# Patient Record
Sex: Female | Born: 1940 | Race: White | Hispanic: No | Smoking: Current every day smoker
Health system: Southern US, Community
[De-identification: ages and names within clinical notes are randomized; demographics above are authoritative.]

## PROBLEM LIST (undated history)

## (undated) DIAGNOSIS — G629 Polyneuropathy, unspecified: Secondary | ICD-10-CM

## (undated) DIAGNOSIS — M5090 Cervical disc disorder, unspecified, unspecified cervical region: Secondary | ICD-10-CM

## (undated) DIAGNOSIS — G2581 Restless legs syndrome: Secondary | ICD-10-CM

## (undated) DIAGNOSIS — M519 Unspecified thoracic, thoracolumbar and lumbosacral intervertebral disc disorder: Secondary | ICD-10-CM

## (undated) DIAGNOSIS — K219 Gastro-esophageal reflux disease without esophagitis: Secondary | ICD-10-CM

## (undated) DIAGNOSIS — G4733 Obstructive sleep apnea (adult) (pediatric): Secondary | ICD-10-CM

## (undated) DIAGNOSIS — F1011 Alcohol abuse, in remission: Secondary | ICD-10-CM

## (undated) DIAGNOSIS — F329 Major depressive disorder, single episode, unspecified: Secondary | ICD-10-CM

## (undated) DIAGNOSIS — E119 Type 2 diabetes mellitus without complications: Secondary | ICD-10-CM

## (undated) DIAGNOSIS — I480 Paroxysmal atrial fibrillation: Secondary | ICD-10-CM

## (undated) DIAGNOSIS — J449 Chronic obstructive pulmonary disease, unspecified: Secondary | ICD-10-CM

## (undated) DIAGNOSIS — E039 Hypothyroidism, unspecified: Secondary | ICD-10-CM

## (undated) DIAGNOSIS — I5032 Chronic diastolic (congestive) heart failure: Secondary | ICD-10-CM

## (undated) DIAGNOSIS — F32A Depression, unspecified: Secondary | ICD-10-CM

## (undated) DIAGNOSIS — E785 Hyperlipidemia, unspecified: Secondary | ICD-10-CM

## (undated) DIAGNOSIS — J9600 Acute respiratory failure, unspecified whether with hypoxia or hypercapnia: Secondary | ICD-10-CM

## (undated) DIAGNOSIS — F29 Unspecified psychosis not due to a substance or known physiological condition: Secondary | ICD-10-CM

## (undated) DIAGNOSIS — D649 Anemia, unspecified: Secondary | ICD-10-CM

## (undated) DIAGNOSIS — I119 Hypertensive heart disease without heart failure: Secondary | ICD-10-CM

## (undated) DIAGNOSIS — I2510 Atherosclerotic heart disease of native coronary artery without angina pectoris: Secondary | ICD-10-CM

## (undated) DIAGNOSIS — I4892 Unspecified atrial flutter: Secondary | ICD-10-CM

## (undated) HISTORY — DX: Unspecified thoracic, thoracolumbar and lumbosacral intervertebral disc disorder: M51.9

## (undated) HISTORY — DX: Paroxysmal atrial fibrillation: I48.0

## (undated) HISTORY — PX: REPLACEMENT TOTAL KNEE: SUR1224

## (undated) HISTORY — DX: Acute respiratory failure, unspecified whether with hypoxia or hypercapnia: J96.00

## (undated) HISTORY — PX: TONSILLECTOMY: SUR1361

## (undated) HISTORY — DX: Hyperlipidemia, unspecified: E78.5

## (undated) HISTORY — PX: THYROIDECTOMY: SHX17

## (undated) HISTORY — DX: Cervical disc disorder, unspecified, unspecified cervical region: M50.90

## (undated) HISTORY — DX: Hypertensive heart disease without heart failure: I11.9

## (undated) HISTORY — DX: Anemia, unspecified: D64.9

## (undated) HISTORY — DX: Unspecified psychosis not due to a substance or known physiological condition: F29

## (undated) HISTORY — DX: Hypothyroidism, unspecified: E03.9

## (undated) HISTORY — PX: CARDIOVERSION: SHX1299

## (undated) HISTORY — DX: Restless legs syndrome: G25.81

## (undated) HISTORY — DX: Depression, unspecified: F32.A

## (undated) HISTORY — DX: Polyneuropathy, unspecified: G62.9

## (undated) HISTORY — DX: Atherosclerotic heart disease of native coronary artery without angina pectoris: I25.10

## (undated) HISTORY — DX: Unspecified atrial flutter: I48.92

## (undated) HISTORY — DX: Chronic obstructive pulmonary disease, unspecified: J44.9

## (undated) HISTORY — DX: Obstructive sleep apnea (adult) (pediatric): G47.33

## (undated) HISTORY — DX: Major depressive disorder, single episode, unspecified: F32.9

## (undated) HISTORY — DX: Chronic diastolic (congestive) heart failure: I50.32

## (undated) HISTORY — DX: Type 2 diabetes mellitus without complications: E11.9

## (undated) HISTORY — DX: Alcohol abuse, in remission: F10.11

## (undated) HISTORY — DX: Gastro-esophageal reflux disease without esophagitis: K21.9

## (undated) HISTORY — PX: COLONOSCOPY: SHX174

---

## 2003-03-28 ENCOUNTER — Other Ambulatory Visit: Admission: RE | Admit: 2003-03-28 | Discharge: 2003-03-28 | Payer: Self-pay | Admitting: Family Medicine

## 2003-09-25 ENCOUNTER — Other Ambulatory Visit: Payer: Self-pay

## 2004-02-05 ENCOUNTER — Other Ambulatory Visit: Payer: Self-pay

## 2004-08-07 ENCOUNTER — Ambulatory Visit: Payer: Self-pay

## 2005-05-12 ENCOUNTER — Ambulatory Visit: Payer: Self-pay | Admitting: Internal Medicine

## 2005-05-17 ENCOUNTER — Ambulatory Visit: Payer: Self-pay | Admitting: Internal Medicine

## 2005-09-05 ENCOUNTER — Ambulatory Visit: Payer: Self-pay | Admitting: Unknown Physician Specialty

## 2005-10-01 ENCOUNTER — Other Ambulatory Visit: Payer: Self-pay

## 2005-10-08 ENCOUNTER — Inpatient Hospital Stay: Payer: Self-pay | Admitting: Unknown Physician Specialty

## 2005-11-28 ENCOUNTER — Ambulatory Visit: Payer: Self-pay | Admitting: Urology

## 2006-01-02 ENCOUNTER — Ambulatory Visit: Payer: Self-pay | Admitting: Urology

## 2006-01-05 ENCOUNTER — Ambulatory Visit: Payer: Self-pay | Admitting: Urology

## 2006-01-13 ENCOUNTER — Other Ambulatory Visit: Payer: Self-pay

## 2006-01-19 ENCOUNTER — Inpatient Hospital Stay: Payer: Self-pay | Admitting: Surgery

## 2006-02-11 ENCOUNTER — Other Ambulatory Visit: Payer: Self-pay

## 2006-02-11 ENCOUNTER — Inpatient Hospital Stay: Payer: Self-pay | Admitting: Internal Medicine

## 2006-02-25 ENCOUNTER — Ambulatory Visit: Payer: Self-pay | Admitting: Surgery

## 2006-03-08 ENCOUNTER — Other Ambulatory Visit: Payer: Self-pay

## 2006-03-08 ENCOUNTER — Inpatient Hospital Stay: Payer: Self-pay | Admitting: Internal Medicine

## 2006-03-18 ENCOUNTER — Other Ambulatory Visit: Payer: Self-pay

## 2006-03-25 ENCOUNTER — Ambulatory Visit: Payer: Self-pay | Admitting: Pain Medicine

## 2006-04-22 ENCOUNTER — Ambulatory Visit: Payer: Self-pay | Admitting: Urology

## 2006-04-29 ENCOUNTER — Inpatient Hospital Stay: Payer: Self-pay | Admitting: Surgery

## 2006-06-23 ENCOUNTER — Inpatient Hospital Stay: Payer: Self-pay | Admitting: Surgery

## 2007-05-29 ENCOUNTER — Emergency Department: Payer: Self-pay | Admitting: Unknown Physician Specialty

## 2007-05-29 ENCOUNTER — Other Ambulatory Visit: Payer: Self-pay

## 2007-06-02 ENCOUNTER — Inpatient Hospital Stay: Payer: Self-pay | Admitting: Internal Medicine

## 2007-06-02 ENCOUNTER — Other Ambulatory Visit: Payer: Self-pay

## 2007-06-04 ENCOUNTER — Inpatient Hospital Stay: Payer: Self-pay | Admitting: Psychiatry

## 2007-06-08 ENCOUNTER — Ambulatory Visit: Payer: Self-pay | Admitting: Unknown Physician Specialty

## 2007-08-17 ENCOUNTER — Emergency Department: Payer: Self-pay | Admitting: Emergency Medicine

## 2007-09-09 ENCOUNTER — Inpatient Hospital Stay: Payer: Self-pay | Admitting: Internal Medicine

## 2007-09-09 ENCOUNTER — Other Ambulatory Visit: Payer: Self-pay

## 2007-10-25 ENCOUNTER — Ambulatory Visit: Payer: Self-pay | Admitting: Internal Medicine

## 2007-11-17 ENCOUNTER — Ambulatory Visit: Payer: Self-pay | Admitting: Vascular Surgery

## 2008-01-10 ENCOUNTER — Inpatient Hospital Stay: Payer: Self-pay | Admitting: Internal Medicine

## 2008-01-10 ENCOUNTER — Other Ambulatory Visit: Payer: Self-pay

## 2008-01-16 ENCOUNTER — Inpatient Hospital Stay: Payer: Self-pay | Admitting: Psychiatry

## 2008-02-01 ENCOUNTER — Ambulatory Visit: Payer: Self-pay | Admitting: Unknown Physician Specialty

## 2008-02-04 ENCOUNTER — Ambulatory Visit: Payer: Self-pay | Admitting: Unknown Physician Specialty

## 2008-08-23 ENCOUNTER — Encounter: Admission: RE | Admit: 2008-08-23 | Discharge: 2008-08-23 | Payer: Self-pay | Admitting: Orthopaedic Surgery

## 2008-09-28 ENCOUNTER — Inpatient Hospital Stay: Payer: Self-pay | Admitting: Psychiatry

## 2008-12-20 ENCOUNTER — Emergency Department: Payer: Self-pay

## 2009-01-31 ENCOUNTER — Inpatient Hospital Stay: Payer: Self-pay | Admitting: Psychiatry

## 2009-03-10 ENCOUNTER — Inpatient Hospital Stay: Payer: Self-pay | Admitting: Psychiatry

## 2009-05-09 ENCOUNTER — Inpatient Hospital Stay: Payer: Self-pay | Admitting: Psychiatry

## 2009-08-29 ENCOUNTER — Inpatient Hospital Stay: Payer: Self-pay | Admitting: Psychiatry

## 2009-11-09 ENCOUNTER — Inpatient Hospital Stay: Payer: Self-pay | Admitting: Internal Medicine

## 2010-01-13 ENCOUNTER — Emergency Department: Payer: Self-pay

## 2010-01-17 ENCOUNTER — Inpatient Hospital Stay: Payer: Self-pay | Admitting: Psychiatry

## 2010-05-09 ENCOUNTER — Emergency Department: Payer: Self-pay | Admitting: Emergency Medicine

## 2011-03-07 HISTORY — PX: CERVICAL DISC SURGERY: SHX588

## 2011-05-07 HISTORY — PX: CARDIAC CATHETERIZATION: SHX172

## 2011-05-07 HISTORY — PX: CORONARY ANGIOPLASTY: SHX604

## 2011-05-20 ENCOUNTER — Inpatient Hospital Stay: Payer: Medicare Other | Admitting: Internal Medicine

## 2011-05-20 DIAGNOSIS — R7989 Other specified abnormal findings of blood chemistry: Secondary | ICD-10-CM

## 2011-05-21 ENCOUNTER — Encounter: Payer: Self-pay | Admitting: Cardiovascular Disease

## 2011-05-21 ENCOUNTER — Inpatient Hospital Stay (HOSPITAL_COMMUNITY)
Admission: AD | Admit: 2011-05-21 | Discharge: 2011-05-23 | DRG: 247 | Disposition: A | Discharge status: Home / Self Care | Payer: Medicare Other | Source: Other Acute Inpatient Hospital | Attending: Cardiovascular Disease | Admitting: Cardiovascular Disease

## 2011-05-21 DIAGNOSIS — E039 Hypothyroidism, unspecified: Secondary | ICD-10-CM | POA: Diagnosis present

## 2011-05-21 DIAGNOSIS — M51379 Other intervertebral disc degeneration, lumbosacral region without mention of lumbar back pain or lower extremity pain: Secondary | ICD-10-CM | POA: Diagnosis present

## 2011-05-21 DIAGNOSIS — M5137 Other intervertebral disc degeneration, lumbosacral region: Secondary | ICD-10-CM | POA: Diagnosis present

## 2011-05-21 DIAGNOSIS — I214 Non-ST elevation (NSTEMI) myocardial infarction: Principal | ICD-10-CM | POA: Diagnosis present

## 2011-05-21 DIAGNOSIS — I2510 Atherosclerotic heart disease of native coronary artery without angina pectoris: Secondary | ICD-10-CM

## 2011-05-21 DIAGNOSIS — E785 Hyperlipidemia, unspecified: Secondary | ICD-10-CM | POA: Diagnosis present

## 2011-05-21 DIAGNOSIS — I509 Heart failure, unspecified: Secondary | ICD-10-CM

## 2011-05-21 DIAGNOSIS — I10 Essential (primary) hypertension: Secondary | ICD-10-CM | POA: Diagnosis present

## 2011-05-21 DIAGNOSIS — E669 Obesity, unspecified: Secondary | ICD-10-CM | POA: Diagnosis present

## 2011-05-21 DIAGNOSIS — G8929 Other chronic pain: Secondary | ICD-10-CM | POA: Diagnosis present

## 2011-05-21 DIAGNOSIS — Z96659 Presence of unspecified artificial knee joint: Secondary | ICD-10-CM

## 2011-05-21 DIAGNOSIS — Z87891 Personal history of nicotine dependence: Secondary | ICD-10-CM

## 2011-05-21 LAB — GLUCOSE, CAPILLARY
Glucose-Capillary: 122 mg/dL — ABNORMAL HIGH (ref 70–99)
Glucose-Capillary: 131 mg/dL — ABNORMAL HIGH (ref 70–99)

## 2011-05-22 DIAGNOSIS — I2510 Atherosclerotic heart disease of native coronary artery without angina pectoris: Secondary | ICD-10-CM

## 2011-05-22 LAB — GLUCOSE, CAPILLARY
Glucose-Capillary: 122 mg/dL — ABNORMAL HIGH (ref 70–99)
Glucose-Capillary: 126 mg/dL — ABNORMAL HIGH (ref 70–99)

## 2011-05-22 LAB — BASIC METABOLIC PANEL
BUN: 15 mg/dL (ref 6–23)
Calcium: 9.5 mg/dL (ref 8.4–10.5)
Creatinine, Ser: 0.56 mg/dL (ref 0.50–1.10)
GFR calc Af Amer: >60 mL/min (ref >60)
GFR calc non Af Amer: >60 mL/min (ref >60)
Potassium: 3.3 mEq/L — ABNORMAL LOW (ref 3.5–5.1)

## 2011-05-22 LAB — CBC
HCT: 38.7 % (ref 36.0–46.0)
MCHC: 32.8 g/dL (ref 30.0–36.0)
MCV: 87.6 fL (ref 78.0–100.0)
RDW: 14.2 % (ref 11.5–15.5)

## 2011-05-22 LAB — POCT ACTIVATED CLOTTING TIME: Activated Clotting Time: 380 seconds

## 2011-05-22 LAB — PROTIME-INR: INR: 0.99 (ref 0.00–1.49)

## 2011-05-23 DIAGNOSIS — I214 Non-ST elevation (NSTEMI) myocardial infarction: Secondary | ICD-10-CM

## 2011-05-23 LAB — URINALYSIS, ROUTINE W REFLEX MICROSCOPIC
Bilirubin Urine: NEGATIVE
Ketones, ur: NEGATIVE mg/dL
Protein, ur: 30 mg/dL — AB
Urobilinogen, UA: 0.2 mg/dL (ref 0.0–1.0)
pH: 5.5 (ref 5.0–8.0)

## 2011-05-23 LAB — CBC
MCHC: 31.6 g/dL (ref 30.0–36.0)
RDW: 14.3 % (ref 11.5–15.5)

## 2011-05-23 LAB — URINE MICROSCOPIC-ADD ON

## 2011-05-23 LAB — BASIC METABOLIC PANEL
Calcium: 9.5 mg/dL (ref 8.4–10.5)
Creatinine, Ser: 0.98 mg/dL (ref 0.50–1.10)
GFR calc non Af Amer: 56 mL/min — ABNORMAL LOW (ref >60)
Sodium: 136 mEq/L (ref 135–145)

## 2011-05-23 LAB — GLUCOSE, CAPILLARY: Glucose-Capillary: 117 mg/dL — ABNORMAL HIGH (ref 70–99)

## 2011-05-24 ENCOUNTER — Emergency Department: Payer: Medicare Other | Admitting: *Deleted

## 2011-05-25 ENCOUNTER — Inpatient Hospital Stay: Payer: Medicare Other | Admitting: Internal Medicine

## 2011-05-25 DIAGNOSIS — R0602 Shortness of breath: Secondary | ICD-10-CM

## 2011-05-25 DIAGNOSIS — R011 Cardiac murmur, unspecified: Secondary | ICD-10-CM

## 2011-05-25 DIAGNOSIS — R509 Fever, unspecified: Secondary | ICD-10-CM

## 2011-05-26 DIAGNOSIS — R4182 Altered mental status, unspecified: Secondary | ICD-10-CM

## 2011-05-27 DIAGNOSIS — I4892 Unspecified atrial flutter: Secondary | ICD-10-CM

## 2011-05-27 DIAGNOSIS — I2510 Atherosclerotic heart disease of native coronary artery without angina pectoris: Secondary | ICD-10-CM

## 2011-06-11 ENCOUNTER — Encounter: Payer: Medicare Other | Admitting: Cardiovascular Disease

## 2011-06-11 NOTE — Discharge Summary (Signed)
[name], [name]                ACCOUNT NO.:  [id number]  MEDICAL RECORD NO.:  [id number]  LOCATION:  6529                         FACILITY:  [facility]  PHYSICIAN:  Veverly Fells. Excell Seltzer, MD  DATE OF BIRTH:  Jul 20, 1941  DATE OF ADMISSION:  05/21/2011 DATE OF DISCHARGE:  05/23/2011                              DISCHARGE SUMMARY   PROCEDURES: 1. Cardiac catheterization. 2. PTCA and Promus drug-eluting stent 4.0 x 30 mm to the circumflex.  PRIMARY FINAL DISCHARGE DIAGNOSIS:  Non-ST segment elevation myocardial infarction.  SECONDARY DIAGNOSES: 1. Unresponsive on admission at [facility], responded to Narcan and     medication changes made. 2. Hypertension. 3. Lumbar disk disease. 4. Cervical disk disease with recent surgery in [facility]. 5. Hypothyroidism. 6. History of EtOH dependence with withdrawal seizures. 7. History of depression with suicidal ideation and alcohol     intoxication, requiring admission to Psych facility. 8. Chronic pain issues with narcotic abuse. 9. Dyslipidemia with a total cholesterol of 91, triglycerides 148, HDL     28, LDL 33 at [facility] this admission. 10.Obesity. 11.Allergy or intolerance to AMIODARONE. 12.Status post thyroidectomy, tonsillectomy, rectal fistula, with     colostomy and its reversal as well as a right knee replacement. 13.Remote history of tobacco use.  TIME OF DISCHARGE:  48 minutes.  HOSPITAL COURSE:  [name] is a 70 year old female with no previous history of coronary artery disease.  She was taken to [facility] because of being unresponsive.  She responded to Narcan.  Her EKG was abnormal and her cardiac enzymes were elevated, so she was seen by Cardiology.  Dr. Mariah Milling saw her and followed her while she was at [facility].  She was cathed there and that catheterization showed a totally occluded RCA that was chronic.  The mid circumflex had a 95% stenosis just before OM-1 and this was felt to be the culprit lesion. The LAD  was grossly patent.  Her EF was 55%.  Percutaneous intervention was recommended and she was transferred to [facility] for the procedure.  On the May 22, 2011, she had PTCA and drug-eluting stent described above to the circumflex, reducing the stenosis to zero.  The LAD was rechecked and it showed calcified 50% lesion in the mid vessel.  Dr. Excell Seltzer recommended aspirin and Plavix for 12 months.  On May 23, 2011, [name] was without chest pain, shortness of breath.  She had been on diuretics prior to admission as well as an ACE inhibitor.  Her BUN and creatinine had been somewhat elevated on admission at [facility], but she responded to hydration.  On postprocedure, her BUN is slightly elevated at 21 with a creatinine within normal limits at 0.98.  Additionally, there was concern for over sedation as she was on multiple medications with sedation potential. All of these drugs have been held and her level of consciousness was normal.  She is receiving p.r.n. pain medication.  A urinalysis and urine culture were performed at [facility] which showed greater than 100,000 colonies of E-coli.  Sensitivities were obtained and her antibiotics will be adjusted for this.  On May 23, 2011, [name] was much improved.  She is pending evaluation  by Dr. Excell Seltzer, but tentatively considered stable for discharge back to [facility] with close outpatient follow-up arranged.  DISCHARGE INSTRUCTIONS:  Her activity level is to be increase gradually. She is cleared to restart with physical therapy in 1 week.  She is to stick to a 2 grams sodium ADA carbohydrate modified diet.  Her cath site is to be treated with triple antibiotic ointment and a band-aid daily after bathing, and [facility] is to be contacted p.r.n. for signs of infection such as redness, swelling, or drainage.  She is to follow up with Dr. Mariah Milling at [facility] in [place] on September 5 at 2:30.  She is to follow up with Dr.  Juel Burrow as needed or as scheduled.  DISCHARGE MEDICATIONS:  Will be dictated separately.     Theodore Demark, PA-C   ______________________________ Veverly Fells. Excell Seltzer, MD    RB/MEDQ  D:  05/23/2011  T:  05/23/2011  Job:  [id number]  Electronically Signed by Theodore Demark PA-C on 05/28/2011 07:07:33 AM Electronically Signed by Tonny Bollman MD on 06/11/2011 11:34:03 PM

## 2011-06-11 NOTE — Cardiovascular Report (Signed)
NAMEMELANA, [name]                ACCOUNT NO.:  [id number]  MEDICAL RECORD NO.:  [id number]  LOCATION:  6529                         FACILITY:  [facility]  PHYSICIAN:  Veverly Fells. Excell Seltzer, MD  DATE OF BIRTH:  06/26/1941  DATE OF PROCEDURE:  05/22/2011 DATE OF DISCHARGE:  05/23/2011                           CARDIAC CATHETERIZATION   PROCEDURE:  Percutaneous transluminal coronary angioplasty and stenting of left circumflex.  PROCEDURAL INDICATIONS:  [name] is a 70 year old woman who presented to [facility] with altered mental status.  She has multiple medical problems with chronic pain, diabetes, hypertension.  She ruled in for non-ST-elevation infarction with significant elevation of both her troponin and CK-MB.  She underwent cardiac catheterization at [facility] demonstrating high-grade 90% stenosis of the left circumflex with associated hypodensity.  There is a tandem 75% lesion into the OM-1 branch.  Her LAD was difficult to visualize, but it did not appear to have any severe stenoses and the right coronary artery was chronically occluded and was a small vessel.  She was referred for PCI.  She was transferred to [facility], because of her severe chronic pain and difficulty tolerating the cath procedure.  We planned on attempting a radial PCI.  Risks and indications of procedure were reviewed with the patient and her daughter in detail prior to taking her down to the catheterization lab.  Informed consent was obtained.  Both hands were evaluated with an Freida Busman test and this was negative on both the right and left.  Therefore, the right groin was prepped, draped and anesthetized with 1% lidocaine. An access was obtained into the right femoral artery using the modified Seldinger technique and a 6-French sheath was placed.  Angiomax was started for anticoagulation.  The patient had been preloaded with Plavix.  An XBLAD 3.5-cm guide catheter was utilized.   There was marked tortuosity in the iliac system and a long sheath had to be placed.  The guide selectively engaged the circumflex.  I did several injections in trying to engage the LAD and was able to adequately visualize the LAD throughout its distribution.  There was a 50% mid-LAD stenosis, but there did not appear to be any hemodynamically significant stenoses throughout the LAD.  The circumflex showed high-grade stenosis as described with a 90% hypodense lesion and probable calcification in the proximal circumflex followed by 75% stenosis in the mid circumflex into the OM-1.  The patient again had difficulty tolerating the procedure and had great deal of pain in her back.  Her blood pressure was very high. She was treated with intravenous Dilaudid and Versed with multiple rounds of medication and also was given IV labetalol for blood pressure control.  These measures improved her blood pressure and pain level so that we could continue the procedure.  Once a therapeutic ACT was achieved, a cougar wire was advanced into the distal OM branch and the vessel was predilated with a 3.0 x 20-mm balloon.  With tandem lesions in a large vessel, I elected to cover this with a single stent.  A 4.0 x 38-mm Promus Element stent platform was chosen and the stent was deployed at 14 atmospheres.  It appeared  well expanded.  The stent was postdilated also with a 4-mm balloon which was taken to high pressure over the proximal segment in an area where the stent appeared a little under-expanded.  There was an excellent angiographic result with 0% residual stenosis at the completion of the procedure.  There was TIMI 3 flow both pre and post PCI.  The patient tolerated the procedure well. There were no immediate complications.  FINAL CONCLUSIONS:  Successful percutaneous intervention of tandem lesions in the left circumflex utilizing a 4.0 x 38-mm Promus drug- eluting stent.  RECOMMENDATIONS:  The  patient should continue on dual antiplatelet therapy with aspirin and Plavix for minimum of 12 months.     Veverly Fells. Excell Seltzer, MD     MDC/MEDQ  D:  05/24/2011  T:  05/24/2011  Job:  [id number]  Electronically Signed by Tonny Bollman MD on 06/11/2011 11:34:06 PM

## 2011-06-14 ENCOUNTER — Encounter: Payer: Self-pay | Admitting: Cardiovascular Disease

## 2011-06-14 ENCOUNTER — Inpatient Hospital Stay: Payer: Medicare Other | Admitting: Internal Medicine

## 2011-06-15 ENCOUNTER — Encounter: Payer: Self-pay | Admitting: Cardiovascular Disease

## 2011-06-15 DIAGNOSIS — R4182 Altered mental status, unspecified: Secondary | ICD-10-CM

## 2011-06-19 ENCOUNTER — Encounter: Payer: Self-pay | Admitting: Cardiovascular Disease

## 2011-06-19 ENCOUNTER — Ambulatory Visit (INDEPENDENT_AMBULATORY_CARE_PROVIDER_SITE_OTHER): Payer: Medicare Other | Admitting: Cardiovascular Disease

## 2011-06-19 DIAGNOSIS — G2581 Restless legs syndrome: Secondary | ICD-10-CM | POA: Insufficient documentation

## 2011-06-19 DIAGNOSIS — I4892 Unspecified atrial flutter: Secondary | ICD-10-CM | POA: Insufficient documentation

## 2011-06-19 DIAGNOSIS — E785 Hyperlipidemia, unspecified: Secondary | ICD-10-CM

## 2011-06-19 DIAGNOSIS — R4182 Altered mental status, unspecified: Secondary | ICD-10-CM

## 2011-06-19 DIAGNOSIS — I2510 Atherosclerotic heart disease of native coronary artery without angina pectoris: Secondary | ICD-10-CM | POA: Insufficient documentation

## 2011-06-19 NOTE — Assessment & Plan Note (Signed)
Currently with no symptoms of angina. No further workup at this time. Continue current medication regimen. 

## 2011-06-19 NOTE — Assessment & Plan Note (Signed)
Significant symptoms of restless leg during the daytime. The rest of chart with Dr. Elease Hashimoto concerning her medications

## 2011-06-19 NOTE — Assessment & Plan Note (Addendum)
We will increase her metoprolol tartrate  To 25 mg b.i.d. With hold parameters. Blood pressure is in the 130 range systolic today. Heart rate is mildly elevated.

## 2011-06-19 NOTE — Progress Notes (Signed)
Patient ID: [name], female    DOB: 1940/10/09, 70 y.o.   MRN: [id number]  HPI Comments: 70 year old female with CAD, Recent episodes of mental status changes and unresponsiveness secondary to polypharmacy, recently seen by myself at [facility] with cardiac catheterization  Performed for elevated cardiac enzymes showing a totally occluded RCA that was chronic, mid circumflex with 95% stenosis just before OM-1, 50% mid LAD,  EF was 55%. PCI performed on August 16 with DES stent, Additional episodes of unresponsiveness from her medications, one of the episodes requiring intubation for airway protection, also with episodes of atrial flutter, who presents to the clinic to establish care.  She is currently staying at [facility] and reports that she is doing well but does have significant restless leg symptoms during the daytime. She takes medication at night for her restless leg though reports that this wears off by the morning and she is having significant discomfort during the daytime.  She denies any significant shortness of breath or chest pain. Her edema has significantly improved. Overall she has no complaints apart from her legs  EKG shows normal sinus rhythm with rate 83 beats per minute with no significant ST or T wave changes          Outpatient Encounter Prescriptions as of 06/19/2011  Medication Sig Dispense Refill  . aspirin 325 MG tablet Take 325 mg by mouth daily.        . baclofen (LIORESAL) 10 MG tablet Take 10 mg by mouth 3 (three) times daily.        . citalopram (CELEXA) 20 MG tablet Take 20 mg by mouth daily. Take three tablets daily.       . clopidogrel (PLAVIX) 75 MG tablet Take 75 mg by mouth daily.        . DULoxetine (CYMBALTA) 60 MG capsule Take 60 mg by mouth 2 (two) times daily.        Marland Kitchen enoxaparin (LOVENOX) 40 MG/0.4ML SOLN Inject into the skin.        Marland Kitchen levothyroxine (SYNTHROID, LEVOTHROID) 150 MCG tablet Take 150 mcg by mouth daily.        Marland Kitchen lisinopril  (PRINIVIL,ZESTRIL) 20 MG tablet Take 20 mg by mouth daily.        Marland Kitchen LORazepam (ATIVAN) 0.5 MG tablet Take 0.5 mg by mouth every 8 (eight) hours.        . metFORMIN (GLUCOPHAGE) 500 MG tablet Take 500 mg by mouth daily with breakfast.        . metoprolol tartrate (LOPRESSOR) 25 MG tablet Take 25 mg by mouth once a day      . Multiple Vitamin (MULTIVITAMIN) tablet Take 1 tablet by mouth daily.        Marland Kitchen oxyCODONE-acetaminophen (PERCOCET) 5-325 MG per tablet Take 1 tablet by mouth every 4 (four) hours as needed.        Marland Kitchen QUEtiapine (SEROQUEL) 200 MG tablet Take 200 mg by mouth at bedtime.        . simvastatin (ZOCOR) 20 MG tablet Take 20 mg by mouth at bedtime.           Review of Systems  Constitutional: Negative.   HENT: Negative.   Eyes: Negative.   Respiratory: Negative.   Cardiovascular: Negative.   Gastrointestinal: Negative.   Musculoskeletal: Positive for back pain, arthralgias and gait problem.  Skin: Negative.   Neurological: Negative.   Hematological: Negative.   Psychiatric/Behavioral: Negative.   All other systems reviewed and are negative.  BP 135/70  Pulse 84  Ht 5\' 5"  (1.651 m)  Wt 233 lb (105.688 kg)  BMI 38.77 kg/m2   Physical Exam  Nursing note and vitals reviewed. Constitutional: She is oriented to person, place, and time. She appears well-developed and well-nourished.       Obese woman, sitting in a wheelchair, unable to ambulate well.  HENT:  Head: Normocephalic.  Nose: Nose normal.  Mouth/Throat: Oropharynx is clear and moist.  Eyes: Conjunctivae are normal. Pupils are equal, round, and reactive to light.  Neck: Normal range of motion. Neck supple. No JVD present.  Cardiovascular: Normal rate, regular rhythm, S1 normal, S2 normal, normal heart sounds and intact distal pulses.  Exam reveals no gallop and no friction rub.   No murmur heard. Pulmonary/Chest: Effort normal and breath sounds normal. No respiratory distress. She has no wheezes. She has no  rales. She exhibits no tenderness.  Abdominal: Soft. Bowel sounds are normal. She exhibits no distension. There is no tenderness.  Musculoskeletal: Normal range of motion. She exhibits no edema and no tenderness.  Lymphadenopathy:    She has no cervical adenopathy.  Neurological: She is alert and oriented to person, place, and time. Coordination normal.  Skin: Skin is warm and dry. No rash noted. No erythema.  Psychiatric: She has a normal mood and affect. Her behavior is normal. Judgment and thought content normal.         Assessment and Plan

## 2011-06-19 NOTE — Patient Instructions (Signed)
You are doing well. Please increase the metoprolol tartrate to 25 mg po BID, hold for heart rate less than 60 bpm.  Please call us if you have new issues that need to be addressed before your next appt.  We will call you for a follow up Appt. In 6 months

## 2011-06-19 NOTE — Assessment & Plan Note (Signed)
She has had numerous episodes of mental status changes and unconsciousness, one episode requiring ventilation. She is asking about additional medications for restless leg. This needs to come from Dr. Elease Hashimoto to avoid polypharmacy.

## 2011-06-19 NOTE — Assessment & Plan Note (Signed)
She is coming on simvastatin 20 mg daily. We have suggested recheck her cholesterol in several months time. Goal LDL less than 70.

## 2012-01-12 ENCOUNTER — Encounter: Payer: Self-pay | Admitting: *Deleted

## 2012-01-14 ENCOUNTER — Ambulatory Visit (INDEPENDENT_AMBULATORY_CARE_PROVIDER_SITE_OTHER): Payer: Medicare Other | Admitting: Cardiovascular Disease

## 2012-01-14 ENCOUNTER — Encounter: Payer: Self-pay | Admitting: Cardiovascular Disease

## 2012-01-14 DIAGNOSIS — I959 Hypotension, unspecified: Secondary | ICD-10-CM | POA: Insufficient documentation

## 2012-01-14 DIAGNOSIS — I2510 Atherosclerotic heart disease of native coronary artery without angina pectoris: Secondary | ICD-10-CM

## 2012-01-14 DIAGNOSIS — R609 Edema, unspecified: Secondary | ICD-10-CM | POA: Insufficient documentation

## 2012-01-14 DIAGNOSIS — E785 Hyperlipidemia, unspecified: Secondary | ICD-10-CM

## 2012-01-14 DIAGNOSIS — R079 Chest pain, unspecified: Secondary | ICD-10-CM

## 2012-01-14 DIAGNOSIS — R011 Cardiac murmur, unspecified: Secondary | ICD-10-CM

## 2012-01-14 NOTE — Assessment & Plan Note (Signed)
She reports worsening lower extremity edema. Exam shows skin changes consistent with swelling, with significant skin exfoliation from her knee to her toes, worse on the left. I suspect this is dependent edema and may do better with TED hose. I have mentioned this to her.   We have ordered an echocardiogram to evaluate her right ventricular systolic pressure to help guide possible diuresis if needed. She also has a murmur on exam.  No recent echocardiogram for comparison.

## 2012-01-14 NOTE — Patient Instructions (Addendum)
Your blood pressure is very low today. Please decrease the lisinopril to 10 mg a day, down from 20 mg a day  We have ordered an echocardiogram for edema and murmur This is scheduled on : 01/27/12 @ 8:30 AM @ THE [facility] IN [place]    Please call us if you have new issues that need to be addressed before your next appt.  Your physician wants you to follow-up in: 6 months.  You will receive a reminder letter in the mail two months in advance. If you don't receive a letter, please call our office to schedule the follow-up appointment.

## 2012-01-14 NOTE — Progress Notes (Signed)
Patient ID: [name], female    DOB: December 17, 1940, 71 y.o.   MRN: [id number]  HPI Comments: 71 year old female with CAD, Recent episodes of mental status changes and unresponsiveness secondary to polypharmacy, recently seen by myself at [facility] with cardiac catheterization  Performed for elevated cardiac enzymes showing a totally occluded RCA that was chronic, mid circumflex with 95% stenosis just before OM-1, 50% mid LAD,  EF was 55%. PCI performed on August 16 with DES sten to the left circumflex t, Additional episodes of unresponsiveness from her medications, one of the episodes requiring intubation for airway protection, also with episodes of atrial flutter, who presents to the clinic to establish care.  She continues to live at [facility]. She reports that she does not walk secondary to lower extremity neuropathy and weakness. She has  significant restless leg symptoms during the daytime. She denies any significant shortness of breath or chest pain. She reports that her lower extremity edema has gotten worse. It seems to improve in the morning, worse at the end of the day . She has her legs down most of the day Overall she has no complaints apart from her legs.  EKG shows normal sinus rhythm with rate 89 beats per minute with no significant ST or T wave changes          Outpatient Encounter Prescriptions as of 01/14/2012  Medication Sig Dispense Refill  . aspirin 325 MG tablet Take 325 mg by mouth daily.        . clonazePAM (KLONOPIN) 0.5 MG tablet Take 0.5 mg by mouth at bedtime.      . clopidogrel (PLAVIX) 75 MG tablet Take 75 mg by mouth daily.        . DULoxetine (CYMBALTA) 60 MG capsule Take 60 mg by mouth 2 (two) times daily.        . hydroxypropyl methylcellulose (ISOPTO TEARS) 2.5 % ophthalmic solution Apply 1 drop to eye 4 (four) times daily as needed.      Marland Kitchen levothyroxine (SYNTHROID, LEVOTHROID) 150 MCG tablet Take 150 mcg by mouth daily.        Marland Kitchen lidocaine (LIDODERM) 5 %  Place 1 patch onto the skin daily. Remove & Discard patch within 12 hours or as directed by MD      . lisinopril (PRINIVIL,ZESTRIL) 20 MG tablet Take 20 mg by mouth daily.        Marland Kitchen LORazepam (ATIVAN) 0.5 MG tablet Take 0.5 mg by mouth every 8 (eight) hours.        . metFORMIN (GLUCOPHAGE) 500 MG tablet Take 500 mg by mouth daily with breakfast.        . metoprolol tartrate (LOPRESSOR) 25 MG tablet Take 1/2 tablet twice a day.      . Multiple Vitamin (MULTIVITAMIN) tablet Take 1 tablet by mouth daily.        Marland Kitchen oxyCODONE-acetaminophen (PERCOCET) 5-325 MG per tablet Take 1 tablet by mouth every 4 (four) hours as needed.        Marland Kitchen QUEtiapine (SEROQUEL) 200 MG tablet Take 200 mg by mouth at bedtime.        Marland Kitchen rOPINIRole (REQUIP) 1 MG tablet Take 1 mg by mouth 3 (three) times daily.      Marland Kitchen saccharomyces boulardii (FLORASTOR) 250 MG capsule Take 250 mg by mouth 2 (two) times daily.      . simvastatin (ZOCOR) 20 MG tablet Take 20 mg by mouth at bedtime.        Marland Kitchen  tiotropium (SPIRIVA) 18 MCG inhalation capsule Place 18 mcg into inhaler and inhale daily.      Marland Kitchen triamterene-hydrochlorothiazide (MAXZIDE-25) 37.5-25 MG per tablet Take 1 tablet by mouth daily.        Review of Systems  Constitutional: Negative.   HENT: Negative.   Eyes: Negative.   Respiratory: Negative.   Cardiovascular: Positive for leg swelling.  Gastrointestinal: Negative.   Musculoskeletal: Positive for back pain, arthralgias and gait problem.  Skin: Negative.   Neurological: Negative.   Hematological: Negative.   Psychiatric/Behavioral: Negative.   All other systems reviewed and are negative.    BP 86/52  Pulse 89  Ht 5' 5.5" (1.664 m)  Wt 248 lb (112.492 kg)  BMI 40.64 kg/m2  SpO2 96%  Physical Exam  Nursing note and vitals reviewed. Constitutional: She is oriented to person, place, and time. She appears well-developed and well-nourished.       Obese woman, sitting in a wheelchair, unable to ambulate well.  HENT:    Head: Normocephalic.  Nose: Nose normal.  Mouth/Throat: Oropharynx is clear and moist.  Eyes: Conjunctivae are normal. Pupils are equal, round, and reactive to light.  Neck: Normal range of motion. Neck supple. No JVD present.  Cardiovascular: Normal rate, regular rhythm, S1 normal, S2 normal and intact distal pulses.  Exam reveals no gallop and no friction rub.   Murmur heard. Pulmonary/Chest: Effort normal and breath sounds normal. No respiratory distress. She has no wheezes. She has no rales. She exhibits no tenderness.  Abdominal: Soft. Bowel sounds are normal. She exhibits no distension. There is no tenderness.  Musculoskeletal: Normal range of motion. She exhibits edema. She exhibits no tenderness.  Lymphadenopathy:    She has no cervical adenopathy.  Neurological: She is alert and oriented to person, place, and time. Coordination normal.  Skin: Skin is warm and dry. No rash noted. No erythema.  Psychiatric: She has a normal mood and affect. Her behavior is normal. Judgment and thought content normal.         Assessment and Plan

## 2012-01-14 NOTE — Assessment & Plan Note (Signed)
Blood pressure is low today. She denies any lightheadedness or dizziness. We have suggested she decrease the lisinopril from 20 mg daily to 10 mg daily with close monitoring of her blood pressure. If blood pressure continues to be low, lisinopril could be decreased to 5 mg. Maxide could be cut in half.

## 2012-01-14 NOTE — Assessment & Plan Note (Signed)
Currently with no symptoms of angina. No further workup at this time. Continue current medication regimen. 

## 2012-01-14 NOTE — Assessment & Plan Note (Signed)
Goal total cholesterol less than 150, LDL less than 70. We do not have access to recent lab work.

## 2012-01-27 ENCOUNTER — Telehealth: Payer: Self-pay | Admitting: Cardiovascular Disease

## 2012-01-27 ENCOUNTER — Other Ambulatory Visit: Payer: Medicare Other

## 2012-01-27 NOTE — Telephone Encounter (Signed)
Pt can not get into a bed to have the echo done in the office. Pt needs to be scheduled somewhere that it can be done in the chair or other means.

## 2012-01-27 NOTE — Telephone Encounter (Signed)
Where do you recommend the patient have ECHO?

## 2012-01-29 NOTE — Telephone Encounter (Signed)
Notified [facility] with [facility] to have EMS non emergency set up for the patient to have ECHO on Feb 12, 2012 to be done at [facility].  [facility] will set up for EMS non emergency to be available.

## 2012-01-29 NOTE — Telephone Encounter (Signed)
Can we talk with patient why she can not be flat? Would she be ok on a pillow or two? Perhaps missy in [place] can accomidate. Pictures are usually not very good with her in a chair.

## 2012-02-12 ENCOUNTER — Other Ambulatory Visit (INDEPENDENT_AMBULATORY_CARE_PROVIDER_SITE_OTHER): Payer: Medicare Other

## 2012-02-12 ENCOUNTER — Other Ambulatory Visit: Payer: Self-pay

## 2012-02-12 DIAGNOSIS — R609 Edema, unspecified: Secondary | ICD-10-CM

## 2012-02-12 DIAGNOSIS — R011 Cardiac murmur, unspecified: Secondary | ICD-10-CM

## 2012-05-15 ENCOUNTER — Inpatient Hospital Stay: Payer: Self-pay | Admitting: Internal Medicine

## 2012-05-15 LAB — URINALYSIS, COMPLETE
Blood: NEGATIVE
Hyaline Cast: 2
Nitrite: NEGATIVE
Ph: 6 (ref 4.5–8.0)
Protein: 30
RBC,UR: 14 /HPF (ref 0–5)
Specific Gravity: 1.016 (ref 1.003–1.030)
Squamous Epithelial: 3
WBC UR: 186 /HPF (ref 0–5)

## 2012-05-15 LAB — BASIC METABOLIC PANEL
Anion Gap: 1 — ABNORMAL LOW (ref 7–16)
BUN: 22 mg/dL — ABNORMAL HIGH (ref 7–18)
Calcium, Total: 8.8 mg/dL (ref 8.5–10.1)
Chloride: 108 mmol/L — ABNORMAL HIGH (ref 98–107)
Co2: 33 mmol/L — ABNORMAL HIGH (ref 21–32)
Osmolality: 289 (ref 275–301)
Potassium: 4.4 mmol/L (ref 3.5–5.1)

## 2012-05-15 LAB — CBC
MCH: 29 pg (ref 26.0–34.0)
MCV: 92 fL (ref 80–100)
Platelet: 233 10*3/uL (ref 150–440)
RDW: 15.4 % — ABNORMAL HIGH (ref 11.5–14.5)
WBC: 11.7 10*3/uL — ABNORMAL HIGH (ref 3.6–11.0)

## 2012-05-15 LAB — CK TOTAL AND CKMB (NOT AT ARMC): CK-MB: 2.7 ng/mL (ref 0.5–3.6)

## 2012-05-15 LAB — TROPONIN I: Troponin-I: <0.02 ng/mL

## 2012-05-15 LAB — MAGNESIUM: Magnesium: 1.6 mg/dL — ABNORMAL LOW

## 2012-05-15 LAB — TSH: Thyroid Stimulating Horm: 3.66 u[IU]/mL

## 2012-05-16 LAB — URINALYSIS, COMPLETE
Ketone: NEGATIVE
Nitrite: NEGATIVE
Ph: 6 (ref 4.5–8.0)
Protein: 100
RBC,UR: 56 /HPF (ref 0–5)
Specific Gravity: 1.012 (ref 1.003–1.030)

## 2012-05-16 LAB — POTASSIUM: Potassium: 4.4 mmol/L (ref 3.5–5.1)

## 2012-05-16 LAB — MAGNESIUM: Magnesium: 1.6 mg/dL — ABNORMAL LOW

## 2012-05-18 LAB — URINE CULTURE

## 2012-12-31 ENCOUNTER — Inpatient Hospital Stay: Payer: Self-pay | Admitting: Student

## 2012-12-31 LAB — CK TOTAL AND CKMB (NOT AT ARMC): CK-MB: 1.8 ng/mL (ref 0.5–3.6)

## 2012-12-31 LAB — CBC WITH DIFFERENTIAL/PLATELET
Basophil #: 0.1 10*3/uL (ref 0.0–0.1)
Basophil %: 0.7 %
Eosinophil #: 0.2 10*3/uL (ref 0.0–0.7)
Eosinophil %: 1.6 %
HCT: 37.3 % (ref 35.0–47.0)
HGB: 12.3 g/dL (ref 12.0–16.0)
Lymphocyte #: 1.8 10*3/uL (ref 1.0–3.6)
MCHC: 32.9 g/dL (ref 32.0–36.0)
Monocyte #: 1.1 x10 3/mm — ABNORMAL HIGH (ref 0.2–0.9)
Monocyte %: 10.7 %
Neutrophil #: 7.6 10*3/uL — ABNORMAL HIGH (ref 1.4–6.5)
Neutrophil %: 70.5 %
RDW: 14.6 % — ABNORMAL HIGH (ref 11.5–14.5)

## 2012-12-31 LAB — COMPREHENSIVE METABOLIC PANEL
Albumin: 2.7 g/dL — ABNORMAL LOW (ref 3.4–5.0)
Anion Gap: 2 — ABNORMAL LOW (ref 7–16)
Co2: 34 mmol/L — ABNORMAL HIGH (ref 21–32)
Creatinine: 1.02 mg/dL (ref 0.60–1.30)
SGOT(AST): 29 U/L (ref 15–37)
Sodium: 138 mmol/L (ref 136–145)

## 2012-12-31 LAB — URINALYSIS, COMPLETE
Bacteria: NONE SEEN
Glucose,UR: NEGATIVE mg/dL (ref 0–75)
Ketone: NEGATIVE
Ph: 6 (ref 4.5–8.0)
RBC,UR: NONE SEEN /HPF (ref 0–5)
Squamous Epithelial: 2
WBC UR: 153 /HPF (ref 0–5)

## 2012-12-31 LAB — T4, FREE: Free Thyroxine: 0.86 ng/dL (ref 0.76–1.46)

## 2012-12-31 LAB — TSH: Thyroid Stimulating Horm: 6.57 u[IU]/mL — ABNORMAL HIGH

## 2012-12-31 LAB — TROPONIN I: Troponin-I: <0.02 ng/mL

## 2013-01-01 LAB — BASIC METABOLIC PANEL
Anion Gap: 3 — ABNORMAL LOW (ref 7–16)
BUN: 19 mg/dL — ABNORMAL HIGH (ref 7–18)
Chloride: 102 mmol/L (ref 98–107)
Creatinine: 0.59 mg/dL — ABNORMAL LOW (ref 0.60–1.30)
EGFR (Non-African Amer.): >60
Osmolality: 279 (ref 275–301)
Potassium: 5.7 mmol/L — ABNORMAL HIGH (ref 3.5–5.1)
Sodium: 136 mmol/L (ref 136–145)

## 2013-01-01 LAB — POTASSIUM: Potassium: 4.1 mmol/L (ref 3.5–5.1)

## 2013-01-02 LAB — BASIC METABOLIC PANEL
Anion Gap: 6 — ABNORMAL LOW (ref 7–16)
BUN: 12 mg/dL (ref 7–18)
Co2: 29 mmol/L (ref 21–32)
Creatinine: 0.77 mg/dL (ref 0.60–1.30)
EGFR (African American): >60
EGFR (Non-African Amer.): >60
Glucose: 193 mg/dL — ABNORMAL HIGH (ref 65–99)
Osmolality: 277 (ref 275–301)
Potassium: 3.9 mmol/L (ref 3.5–5.1)
Sodium: 136 mmol/L (ref 136–145)

## 2013-01-04 LAB — PLATELET COUNT: Platelet: 331 10*3/uL (ref 150–440)

## 2013-01-13 ENCOUNTER — Ambulatory Visit (INDEPENDENT_AMBULATORY_CARE_PROVIDER_SITE_OTHER): Payer: Medicare Other | Admitting: Cardiovascular Disease

## 2013-01-13 ENCOUNTER — Encounter: Payer: Self-pay | Admitting: Cardiovascular Disease

## 2013-01-13 VITALS — BP 126/62 | HR 83 | Ht 66.0 in | Wt 274.0 lb

## 2013-01-13 DIAGNOSIS — R609 Edema, unspecified: Secondary | ICD-10-CM

## 2013-01-13 DIAGNOSIS — E785 Hyperlipidemia, unspecified: Secondary | ICD-10-CM

## 2013-01-13 DIAGNOSIS — I4892 Unspecified atrial flutter: Secondary | ICD-10-CM

## 2013-01-13 DIAGNOSIS — R4182 Altered mental status, unspecified: Secondary | ICD-10-CM

## 2013-01-13 DIAGNOSIS — I2510 Atherosclerotic heart disease of native coronary artery without angina pectoris: Secondary | ICD-10-CM

## 2013-01-13 NOTE — Patient Instructions (Addendum)
You are doing well.  If you have worsening leg swelling/edema or worsening shortness of breath,  Please take extra lasix 20 mg x 1 with potassium pill 10 meq after lunch as needed (PRN) In addition to your regular Am lasix and potassium dose.   Please call us if you have new issues that need to be addressed before your next appt.  Your physician wants you to follow-up in: 6 months.  You will receive a reminder letter in the mail two months in advance. If you don't receive a letter, please call our office to schedule the follow-up appointment.

## 2013-01-13 NOTE — Assessment & Plan Note (Signed)
Recurrent episodes of mental status change likely from polypharmacy. CPAP has been ordered.

## 2013-01-13 NOTE — Assessment & Plan Note (Signed)
Maintaining normal sinus rhythm. We'll continue low-dose metoprolol.

## 2013-01-13 NOTE — Assessment & Plan Note (Signed)
Trace pitting edema. She does report that edema waxes and wanes. We have suggested that she take extra Lasix after lunch if needed for worsening edema or shortness of breath. Mild pulmonary hypertension seen on prior echocardiogram

## 2013-01-13 NOTE — Progress Notes (Signed)
Patient ID: [name], female    DOB: 10-15-1940, 72 y.o.   MRN: [id number]  HPI Comments: 72 year old female with CAD, neuropathy in the feet, recent episodes of mental status changes and unresponsiveness secondary to polypharmacy, seen by myself at [facility] with cardiac catheterization performed for elevated cardiac enzymes showing a totally occluded RCA that was chronic, mid circumflex with 95% stenosis just before OM-1, 50% mid LAD,  EF was 55%. PCI performed on August 16 with DES stent to the left circumflex, additional episodes of unresponsiveness from her medications, one of the episodes requiring intubation for airway protection, also with episodes of atrial flutter, who presents to the clinic for routine followup.  She continues to live at [facility]. She reports that she does not walk secondary to lower extremity neuropathy and weakness. She has  significant restless leg symptoms during the daytime. She denies any significant shortness of breath or chest pain.  Minimal lower extremity edema Echocardiogram last year confirmed normal ejection fraction with mild pulmonary hypertension, right ventricular systolic pressure of 40. She takes Lasix daily.   She has her legs down most of the day  She does report having left posterior neck pain ( history of surgery and bone grafting).  Recent discharge from the hospital 01/04/2013 with diagnosis of metabolic encephalopathy a very to UTI and polypharmacy.. Reports that she had any additional episodes of mental status change. Diagnosed also with obstructive sleep apnea and she reports that CPAP has been ordered. Notes from the hospital indicate she had mild CO2 retention. Weight is up 30 pounds for last year.  EKG shows normal sinus rhythm with rate 83 beats per minute with no significant ST or T wave changes          Outpatient Encounter Prescriptions as of 01/13/2013  Medication Sig Dispense Refill  . alum & mag hydroxide-simeth (MAALOX  PLUS) 400-400-40 MG/5ML suspension Take by mouth every 6 (six) hours as needed for indigestion.      Marland Kitchen aspirin 325 MG tablet Take 325 mg by mouth daily.        Marland Kitchen buPROPion (WELLBUTRIN SR) 150 MG 12 hr tablet Take 150 mg by mouth daily.      . Cholecalciferol (VITAMIN D3) 50000 UNITS CAPS Take by mouth once a week.      . clopidogrel (PLAVIX) 75 MG tablet Take 75 mg by mouth daily.        . DULoxetine (CYMBALTA) 60 MG capsule Take 60 mg by mouth 2 (two) times daily.        . furosemide (LASIX) 20 MG tablet Take 20 mg by mouth daily.      Marland Kitchen gabapentin (NEURONTIN) 100 MG capsule Take 100 mg by mouth daily.      Marland Kitchen ipratropium-albuterol (DUONEB) 0.5-2.5 (3) MG/3ML SOLN Take 3 mLs by nebulization as needed.      Marland Kitchen levothyroxine (SYNTHROID, LEVOTHROID) 200 MCG tablet Take 200 mcg by mouth daily before breakfast.      . lidocaine (LIDODERM) 5 % Place 1 patch onto the skin daily. Remove & Discard patch within 12 hours or as directed by MD      . loratadine (CLARITIN) 10 MG tablet Take 10 mg by mouth as needed for allergies.      Marland Kitchen LORazepam (ATIVAN) 0.5 MG tablet Take 0.5 mg by mouth every 8 (eight) hours.        Marland Kitchen MAGNESIUM OXIDE PO Take by mouth 2 (two) times daily.      Marland Kitchen  metFORMIN (GLUCOPHAGE) 500 MG tablet Take 250 mg by mouth daily with breakfast.       . metoprolol tartrate (LOPRESSOR) 25 MG tablet Take 1/2 tablet twice a day.      . Multiple Vitamin (MULTIVITAMIN) tablet Take 1 tablet by mouth daily.        Marland Kitchen oxyCODONE-acetaminophen (PERCOCET) 5-325 MG per tablet Take 1 tablet by mouth every 4 (four) hours as needed.        . pantoprazole (PROTONIX) 40 MG tablet Take 40 mg by mouth daily.      Bertram Gala Glycol-Propyl Glycol (SYSTANE) 0.4-0.3 % SOLN Apply to eye 4 (four) times daily.      . potassium chloride (K-DUR,KLOR-CON) 10 MEQ tablet Take 10 mEq by mouth daily.      . QUEtiapine (SEROQUEL) 200 MG tablet Take 200 mg by mouth at bedtime.        Marland Kitchen QUEtiapine Fumarate (SEROQUEL PO) Take 75 mg  by mouth daily.      Marland Kitchen rOPINIRole (REQUIP) 1 MG tablet Take 1 mg by mouth 2 (two) times daily.       Marland Kitchen saccharomyces boulardii (FLORASTOR) 250 MG capsule Take 250 mg by mouth 2 (two) times daily.      . sennosides-docusate sodium (SENOKOT-S) 8.6-50 MG tablet Take 1 tablet by mouth 2 (two) times daily.      . simvastatin (ZOCOR) 20 MG tablet Take 20 mg by mouth at bedtime.        Marland Kitchen tiotropium (SPIRIVA) 18 MCG inhalation capsule Place 18 mcg into inhaler and inhale daily.        Review of Systems  Constitutional: Negative.   HENT: Negative.   Eyes: Negative.   Respiratory: Negative.   Cardiovascular: Positive for leg swelling.  Gastrointestinal: Negative.   Musculoskeletal: Positive for back pain, arthralgias and gait problem.  Skin: Negative.   Neurological: Negative.   Psychiatric/Behavioral: Negative.   All other systems reviewed and are negative.    BP 126/62  Ht 5\' 6"  (1.676 m)  Wt 274 lb (124.286 kg)  BMI 44.25 kg/m2  Physical Exam  Nursing note and vitals reviewed. Constitutional: She is oriented to person, place, and time. She appears well-developed and well-nourished.  Obese woman, sitting in a wheelchair, unable to ambulate well.  HENT:  Head: Normocephalic.  Nose: Nose normal.  Mouth/Throat: Oropharynx is clear and moist.  Eyes: Conjunctivae are normal. Pupils are equal, round, and reactive to light.  Neck: Normal range of motion. Neck supple. No JVD present.  Cardiovascular: Normal rate, regular rhythm, S1 normal, S2 normal and intact distal pulses.  Exam reveals no gallop and no friction rub.   Murmur heard. Pulmonary/Chest: Effort normal and breath sounds normal. No respiratory distress. She has no wheezes. She has no rales. She exhibits no tenderness.  Abdominal: Soft. Bowel sounds are normal. She exhibits no distension. There is no tenderness.  Musculoskeletal: Normal range of motion. She exhibits no edema and no tenderness.  Lymphadenopathy:    She has no  cervical adenopathy.  Neurological: She is alert and oriented to person, place, and time. Coordination normal.  Skin: Skin is warm and dry. No rash noted. No erythema.  Psychiatric: She has a normal mood and affect. Her behavior is normal. Judgment and thought content normal.    Assessment and Plan

## 2013-01-13 NOTE — Assessment & Plan Note (Signed)
We have suggested that she stay on her simvastatin.

## 2013-01-13 NOTE — Assessment & Plan Note (Signed)
Currently with no symptoms of angina. No further workup at this time. Continue current medication regimen. 

## 2013-01-28 LAB — URINALYSIS, COMPLETE
Bilirubin,UR: NEGATIVE
Glucose,UR: NEGATIVE mg/dL (ref 0–75)
Nitrite: NEGATIVE
Ph: 5 (ref 4.5–8.0)
Protein: 100
RBC,UR: 117 /HPF (ref 0–5)
Specific Gravity: 1.021 (ref 1.003–1.030)

## 2013-01-29 ENCOUNTER — Inpatient Hospital Stay: Payer: Self-pay | Admitting: Internal Medicine

## 2013-01-29 LAB — CK TOTAL AND CKMB (NOT AT ARMC)
CK, Total: 65 U/L (ref 21–215)
CK-MB: 2 ng/mL (ref 0.5–3.6)

## 2013-01-29 LAB — COMPREHENSIVE METABOLIC PANEL
Albumin: 2.7 g/dL — ABNORMAL LOW (ref 3.4–5.0)
Anion Gap: 8 (ref 7–16)
BUN: 34 mg/dL — ABNORMAL HIGH (ref 7–18)
Bilirubin,Total: 0.2 mg/dL (ref 0.2–1.0)
Calcium, Total: 9 mg/dL (ref 8.5–10.1)
Chloride: 101 mmol/L (ref 98–107)
Co2: 27 mmol/L (ref 21–32)
Creatinine: 1.11 mg/dL (ref 0.60–1.30)
Osmolality: 283 (ref 275–301)
Potassium: 4.5 mmol/L (ref 3.5–5.1)
SGOT(AST): 12 U/L — ABNORMAL LOW (ref 15–37)
Sodium: 136 mmol/L (ref 136–145)

## 2013-01-29 LAB — CBC
HCT: 35 % (ref 35.0–47.0)
HGB: 11.2 g/dL — ABNORMAL LOW (ref 12.0–16.0)
MCH: 29.4 pg (ref 26.0–34.0)
MCHC: 32.1 g/dL (ref 32.0–36.0)
MCV: 92 fL (ref 80–100)
Platelet: 238 10*3/uL (ref 150–440)
RBC: 3.83 10*6/uL (ref 3.80–5.20)
RDW: 16.1 % — ABNORMAL HIGH (ref 11.5–14.5)

## 2013-01-29 LAB — TROPONIN I
Troponin-I: <0.02 ng/mL
Troponin-I: <0.02 ng/mL

## 2013-01-29 LAB — PROTIME-INR
INR: 1.1
Prothrombin Time: 13.9 secs (ref 11.5–14.7)

## 2013-01-29 LAB — PRO B NATRIURETIC PEPTIDE: B-Type Natriuretic Peptide: 15686 pg/mL — ABNORMAL HIGH (ref 0–125)

## 2013-01-30 DIAGNOSIS — I4891 Unspecified atrial fibrillation: Secondary | ICD-10-CM

## 2013-01-30 DIAGNOSIS — I2510 Atherosclerotic heart disease of native coronary artery without angina pectoris: Secondary | ICD-10-CM

## 2013-01-30 LAB — CBC WITH DIFFERENTIAL/PLATELET
Basophil #: 0 10*3/uL (ref 0.0–0.1)
Basophil %: 0.4 %
HCT: 35 % (ref 35.0–47.0)
Lymphocyte #: 0.7 10*3/uL — ABNORMAL LOW (ref 1.0–3.6)
Lymphocyte %: 7.8 %
MCV: 91 fL (ref 80–100)
Monocyte %: 9.5 %
Neutrophil #: 7.3 10*3/uL — ABNORMAL HIGH (ref 1.4–6.5)
Neutrophil %: 79.3 %
WBC: 9.2 10*3/uL (ref 3.6–11.0)

## 2013-01-30 LAB — BASIC METABOLIC PANEL
BUN: 26 mg/dL — ABNORMAL HIGH (ref 7–18)
Co2: 32 mmol/L (ref 21–32)
Glucose: 144 mg/dL — ABNORMAL HIGH (ref 65–99)
Osmolality: 285 (ref 275–301)
Potassium: 4.6 mmol/L (ref 3.5–5.1)
Sodium: 139 mmol/L (ref 136–145)

## 2013-01-30 LAB — LIPID PANEL: Triglycerides: 151 mg/dL (ref 0–200)

## 2013-01-30 LAB — TROPONIN I: Troponin-I: <0.02 ng/mL

## 2013-01-31 DIAGNOSIS — I4891 Unspecified atrial fibrillation: Secondary | ICD-10-CM

## 2013-01-31 DIAGNOSIS — I359 Nonrheumatic aortic valve disorder, unspecified: Secondary | ICD-10-CM

## 2013-01-31 LAB — BASIC METABOLIC PANEL
Anion Gap: 6 — ABNORMAL LOW (ref 7–16)
BUN: 23 mg/dL — ABNORMAL HIGH (ref 7–18)
Calcium, Total: 8.5 mg/dL (ref 8.5–10.1)
Chloride: 100 mmol/L (ref 98–107)
Co2: 34 mmol/L — ABNORMAL HIGH (ref 21–32)
EGFR (African American): >60
Glucose: 97 mg/dL (ref 65–99)
Osmolality: 283 (ref 275–301)

## 2013-02-02 LAB — URINE CULTURE

## 2013-02-02 LAB — BASIC METABOLIC PANEL
Calcium, Total: 9.2 mg/dL (ref 8.5–10.1)
Co2: 37 mmol/L — ABNORMAL HIGH (ref 21–32)
EGFR (Non-African Amer.): 54 — ABNORMAL LOW
Glucose: 120 mg/dL — ABNORMAL HIGH (ref 65–99)
Potassium: 4 mmol/L (ref 3.5–5.1)
Sodium: 137 mmol/L (ref 136–145)

## 2013-02-02 LAB — CBC WITH DIFFERENTIAL/PLATELET
Basophil %: 0.4 %
Eosinophil #: 0.4 10*3/uL (ref 0.0–0.7)
HCT: 35.7 % (ref 35.0–47.0)
HGB: 11.6 g/dL — ABNORMAL LOW (ref 12.0–16.0)
Lymphocyte %: 14.6 %
MCH: 28.7 pg (ref 26.0–34.0)
MCHC: 32.4 g/dL (ref 32.0–36.0)
Monocyte #: 1.4 x10 3/mm — ABNORMAL HIGH (ref 0.2–0.9)
Monocyte %: 13.5 %
Neutrophil #: 7.1 10*3/uL — ABNORMAL HIGH (ref 1.4–6.5)
Platelet: 304 10*3/uL (ref 150–440)
WBC: 10.5 10*3/uL (ref 3.6–11.0)

## 2013-02-03 LAB — CULTURE, BLOOD (SINGLE)

## 2013-02-04 DIAGNOSIS — I4891 Unspecified atrial fibrillation: Secondary | ICD-10-CM

## 2013-02-23 ENCOUNTER — Ambulatory Visit: Payer: Self-pay

## 2013-02-28 ENCOUNTER — Other Ambulatory Visit: Payer: Self-pay | Admitting: Family Medicine

## 2013-02-28 LAB — CBC WITH DIFFERENTIAL/PLATELET
Basophil #: 0.1 10*3/uL (ref 0.0–0.1)
Basophil %: 0.5 %
Eosinophil #: 0.3 10*3/uL (ref 0.0–0.7)
Lymphocyte %: 16.5 %
MCH: 29.7 pg (ref 26.0–34.0)
MCHC: 33.8 g/dL (ref 32.0–36.0)
MCV: 88 fL (ref 80–100)
Monocyte %: 11 %
Neutrophil %: 69.6 %
RBC: 4.19 10*6/uL (ref 3.80–5.20)
RDW: 15.6 % — ABNORMAL HIGH (ref 11.5–14.5)
WBC: 11.6 10*3/uL — ABNORMAL HIGH (ref 3.6–11.0)

## 2013-06-02 ENCOUNTER — Ambulatory Visit: Payer: Self-pay | Admitting: Gastroenterology

## 2013-06-02 ENCOUNTER — Ambulatory Visit: Payer: Self-pay | Admitting: Specialist

## 2013-06-09 ENCOUNTER — Telehealth: Payer: Self-pay

## 2013-06-09 NOTE — Telephone Encounter (Signed)
Pt sched for colonoscopy 9/14.  Is it ok for her to stop her Xarelto before procedure, and if so, for how long?  Thank you.

## 2013-06-09 NOTE — Telephone Encounter (Signed)
Needs clearance for colonoscopy on 06/13/2013,needs ok to stop Xarelto. Please have nurse, DeeDee paged at  [facility]

## 2013-06-10 NOTE — Telephone Encounter (Signed)
Spoke w/ April Webster, CMA at [facility] re pt's colonoscopy sched for Monday.  Received cardiac clearance request on 05/30/13 asking for advise on stopping Xarelto & ASA before surgery.   Our records indicate that pt is on Plavix, so Dr. Mariah Milling corrected the clearance letter to state this.  [facility]'s records indicate pt is taking Xarelto & ASA .  LM for [facility] at [facility] to find out what they are giving pt.   April at [facility] spoke w/ [facility] who stated that pt was changed to Xarelto while in the hospital in April. Spoke w/ Dr. Mariah Milling who advised that she stop any blood thinner for the weekend, as her procedure is sched for Monday.   Sent fax to [facility] to hold Xarelto x 2 days before procedure, but baby aspirin was okay to continue.

## 2013-06-13 ENCOUNTER — Ambulatory Visit: Payer: Self-pay | Admitting: Gastroenterology

## 2013-06-16 ENCOUNTER — Ambulatory Visit: Payer: Self-pay | Admitting: Gastroenterology

## 2013-08-15 ENCOUNTER — Encounter: Payer: Self-pay | Admitting: Cardiovascular Disease

## 2013-08-15 ENCOUNTER — Ambulatory Visit (INDEPENDENT_AMBULATORY_CARE_PROVIDER_SITE_OTHER): Payer: PRIVATE HEALTH INSURANCE | Admitting: Cardiovascular Disease

## 2013-08-15 ENCOUNTER — Telehealth: Payer: Self-pay

## 2013-08-15 VITALS — BP 100/64 | HR 147 | Ht 65.0 in | Wt 288.0 lb

## 2013-08-15 DIAGNOSIS — I4892 Unspecified atrial flutter: Secondary | ICD-10-CM

## 2013-08-15 DIAGNOSIS — R0602 Shortness of breath: Secondary | ICD-10-CM

## 2013-08-15 DIAGNOSIS — E785 Hyperlipidemia, unspecified: Secondary | ICD-10-CM

## 2013-08-15 DIAGNOSIS — I2510 Atherosclerotic heart disease of native coronary artery without angina pectoris: Secondary | ICD-10-CM

## 2013-08-15 DIAGNOSIS — R609 Edema, unspecified: Secondary | ICD-10-CM

## 2013-08-15 NOTE — Assessment & Plan Note (Addendum)
Currently with no symptoms of angina. No further workup at this time. Continue current medication regimen. Stent placed several years ago. We'll continue low-dose aspirin

## 2013-08-15 NOTE — Telephone Encounter (Signed)
Spoke w/ Marquita, NP and instructed her to give amiodarone 800 mg twice today as a loading dose, follow printed instructions starting tomorrow. Faxed instructions to [facility].

## 2013-08-15 NOTE — Assessment & Plan Note (Signed)
Worsening edema from acute on chronic diastolic CHF. Likely related to atrial flutter and poor rate control. Continue on Lasix, work on her heart rate.

## 2013-08-15 NOTE — Telephone Encounter (Signed)
Spoke w/ Marquita, NP who states that pt is allergic to amiodarone, citing "intolerance". Reports that pt has had this listed in her chart about 3 years, but does not remember exactly what type of reaction she has.  Spoke w/ Dr Mariah Milling who stated that we will restart her amiodarone and monitor her symptoms.  Spoke w/ Cristal Ford, NP who is agreeable to this plan and will call with any adverse symptoms.

## 2013-08-15 NOTE — Assessment & Plan Note (Signed)
No changes to the medications were made. Encouraged her to stay on her simvastatin

## 2013-08-15 NOTE — Progress Notes (Signed)
Patient ID: [name], female    DOB: 1941/09/15, 72 y.o.   MRN: [id number]  HPI Comments: 72 year old female with CAD, neuropathy in the feet, recent episodes of mental status changes and unresponsiveness secondary to polypharmacy, seen by myself at [facility] with cardiac catheterization performed for elevated cardiac enzymes showing a totally occluded RCA that was chronic, mid circumflex with 95% stenosis just before OM-1, 50% mid LAD,  EF was 55%. PCI performed on August 16 with DES stent to the left circumflex, additional episodes of unresponsiveness from her medications, one of the episodes requiring intubation for airway protection, also with episodes of atrial flutter, who presents to the clinic for routine followup. Previous admissions to the hospital for mental status changes felt to be secondary to polypharmacy. Also has a history of mild CO2 retention, obstructive sleep apnea.  Admission to the hospital April 2014 with shortness of breath, atrial fibrillation, acute diastolic CHF, urinary tract infection. She did convert to normal sinus rhythm the hospital after given Cardizem IV  She continues to live at [facility]. She reports that she does not walk secondary to lower extremity neuropathy and weakness. She has  significant restless leg symptoms during the daytime.   On today's visit, she reports weight gain, worsening leg edema. She has had tachycardia over the past several weeks. Upper respiratory infection started several weeks ago. She has had a course of prednisone, antibiotics, albuterol, Mucinex. She feels her infection is slowly improving, heart rate continues to run fast. Mild shortness of breath, not bad as she does not do any walking. Lasix was increased up to 40 mg daily last week  EKG shows atrial flutter with ventricular rate 149 beats per minute, nonspecific ST abnormality          Outpatient Encounter Prescriptions as of 08/15/2013  Medication Sig  .  acetaminophen (TYLENOL) 650 MG suppository Place 650 mg rectally every 4 (four) hours as needed.  Marland Kitchen albuterol (PROVENTIL) (2.5 MG/3ML) 0.083% nebulizer solution Take 2.5 mg by nebulization every 4 (four) hours as needed for wheezing or shortness of breath.  Marland Kitchen aspirin 81 MG tablet Take 81 mg by mouth daily.  Marland Kitchen buPROPion (WELLBUTRIN SR) 150 MG 12 hr tablet Take 150 mg by mouth daily.  . Cholecalciferol (VITAMIN D3) 50000 UNITS CAPS Take by mouth once a week.  . diltiazem (CARDIZEM) 30 MG tablet Take 30 mg by mouth 4 (four) times daily.  . DULoxetine (CYMBALTA) 60 MG capsule Take 60 mg by mouth 2 (two) times daily.    . furosemide (LASIX) 20 MG tablet Take 40 mg by mouth daily.   Marland Kitchen gabapentin (NEURONTIN) 100 MG capsule Take 100 mg by mouth daily.  . Hypromellose (ARTIFICIAL TEARS) 0.4 % SOLN Apply to eye.  Marland Kitchen ipratropium-albuterol (DUONEB) 0.5-2.5 (3) MG/3ML SOLN Take 3 mLs by nebulization as needed.  Marland Kitchen levothyroxine (SYNTHROID, LEVOTHROID) 200 MCG tablet Take 200 mcg by mouth daily before breakfast.  . lidocaine (LIDODERM) 5 % Place 1 patch onto the skin daily. Remove & Discard patch within 12 hours or as directed by MD  . MAGNESIUM OXIDE PO Take by mouth 2 (two) times daily.  . metFORMIN (GLUCOPHAGE) 500 MG tablet Take 250 mg by mouth daily with breakfast.   . metoprolol tartrate (LOPRESSOR) 25 MG tablet Take 1/2 tablet twice a day.  . Multiple Vitamin (MULTIVITAMIN) tablet Take 1 tablet by mouth daily.    . ondansetron (ZOFRAN) 4 MG tablet Take 4 mg by mouth every 6 (six)  hours as needed for nausea or vomiting.  Marland Kitchen oxyCODONE-acetaminophen (PERCOCET) 5-325 MG per tablet Take 1 tablet by mouth every 4 (four) hours as needed.    . pantoprazole (PROTONIX) 40 MG tablet Take 40 mg by mouth daily.  Bertram Gala Glycol-Propyl Glycol (SYSTANE) 0.4-0.3 % SOLN Apply to eye 4 (four) times daily.  . potassium chloride (K-DUR,KLOR-CON) 10 MEQ tablet Take 20 mEq by mouth daily.   . QUEtiapine Fumarate (SEROQUEL  PO) Take 75 mg by mouth daily.  . Rivaroxaban (XARELTO) 20 MG TABS tablet Take 20 mg by mouth daily with supper.  Marland Kitchen rOPINIRole (REQUIP) 1 MG tablet Take 1 mg by mouth 2 (two) times daily.   . sennosides-docusate sodium (SENOKOT-S) 8.6-50 MG tablet Take 1 tablet by mouth 2 (two) times daily.  . simvastatin (ZOCOR) 20 MG tablet Take 20 mg by mouth at bedtime.    . tamsulosin (FLOMAX) 0.4 MG CAPS capsule Take 0.4 mg by mouth daily.  Marland Kitchen tiotropium (SPIRIVA) 18 MCG inhalation capsule Place 18 mcg into inhaler and inhale daily.  . vitamin C (ASCORBIC ACID) 500 MG tablet Take 500 mg by mouth daily.  . [DISCONTINUED] alum & mag hydroxide-simeth (MAALOX PLUS) 400-400-40 MG/5ML suspension Take by mouth every 6 (six) hours as needed for indigestion.  . [DISCONTINUED] aspirin 325 MG tablet Take 325 mg by mouth daily.    . [DISCONTINUED] clopidogrel (PLAVIX) 75 MG tablet Take 75 mg by mouth daily.    . [DISCONTINUED] loratadine (CLARITIN) 10 MG tablet Take 10 mg by mouth as needed for allergies.  . [DISCONTINUED] LORazepam (ATIVAN) 0.5 MG tablet Take 0.5 mg by mouth every 8 (eight) hours.    . [DISCONTINUED] QUEtiapine (SEROQUEL) 200 MG tablet Take 200 mg by mouth at bedtime.    . [DISCONTINUED] saccharomyces boulardii (FLORASTOR) 250 MG capsule Take 250 mg by mouth 2 (two) times daily.     Review of Systems  Constitutional: Negative.   HENT: Negative.   Eyes: Negative.   Respiratory: Positive for shortness of breath.   Cardiovascular: Positive for leg swelling.  Gastrointestinal: Negative.   Endocrine: Negative.   Musculoskeletal: Positive for arthralgias, back pain and gait problem.  Skin: Negative.   Allergic/Immunologic: Negative.   Neurological: Negative.   Hematological: Negative.   Psychiatric/Behavioral: Negative.   All other systems reviewed and are negative.   BP 100/64  Pulse 147  Ht 5\' 5"  (1.651 m)  Wt 288 lb (130.636 kg)  BMI 47.93 kg/m2  Physical Exam  Nursing note and  vitals reviewed. Constitutional: She is oriented to person, place, and time. She appears well-developed and well-nourished.  Obese woman, sitting in a wheelchair, unable to ambulate well.  HENT:  Head: Normocephalic.  Nose: Nose normal.  Mouth/Throat: Oropharynx is clear and moist.  Eyes: Conjunctivae are normal. Pupils are equal, round, and reactive to light.  Neck: Normal range of motion. Neck supple. No JVD present.  Cardiovascular: Regular rhythm, S1 normal, S2 normal and intact distal pulses.  Tachycardia present.  Exam reveals no gallop and no friction rub.   Murmur heard. Pulmonary/Chest: Effort normal and breath sounds normal. No respiratory distress. She has no wheezes. She has no rales. She exhibits no tenderness.  Abdominal: Soft. Bowel sounds are normal. She exhibits no distension. There is no tenderness.  Musculoskeletal: Normal range of motion. She exhibits no edema and no tenderness.  Lymphadenopathy:    She has no cervical adenopathy.  Neurological: She is alert and oriented to person, place, and time. Coordination normal.  Skin: Skin is warm and dry. No rash noted. No erythema.  Psychiatric: She has a normal mood and affect. Her behavior is normal. Judgment and thought content normal.    Assessment and Plan

## 2013-08-15 NOTE — Assessment & Plan Note (Signed)
She is in atrial flutter on today's visit. Per her report, I suspect she has been in this arrhythmia for several weeks as she reports tachycardia since her upper respiratory infection started. She is relatively comfortable sitting in a wheelchair. She does not walk at baseline. She does have worsening edema but no significant shortness of breath. She is high risk for acute on chronic diastolic CHF. Suggested she stay on her Lasix 40 mg daily. We will start amiodarone 400 mg twice a day in an effort to slow her rate. She is on anticoagulation daily. It is possible that amiodarone could convert her back to  normal sinus rhythm.

## 2013-08-15 NOTE — Patient Instructions (Signed)
You are in atrial flutter today, heart rate 150 beats per minute Please start amiodarone 400 mg twice a day for 5 days, Then decrease the dose down to 200 mg twice a day Continue your other medications  Please call us if you have new issues that need to be addressed before your next appt.  Your physician wants you to follow-up in: 1 week

## 2013-08-22 ENCOUNTER — Encounter: Payer: Self-pay | Admitting: Cardiovascular Disease

## 2013-08-22 ENCOUNTER — Ambulatory Visit (INDEPENDENT_AMBULATORY_CARE_PROVIDER_SITE_OTHER): Payer: PRIVATE HEALTH INSURANCE | Admitting: Cardiovascular Disease

## 2013-08-22 VITALS — BP 120/72 | HR 127 | Ht 65.5 in | Wt 280.0 lb

## 2013-08-22 DIAGNOSIS — E785 Hyperlipidemia, unspecified: Secondary | ICD-10-CM

## 2013-08-22 DIAGNOSIS — I2510 Atherosclerotic heart disease of native coronary artery without angina pectoris: Secondary | ICD-10-CM

## 2013-08-22 DIAGNOSIS — I4892 Unspecified atrial flutter: Secondary | ICD-10-CM

## 2013-08-22 DIAGNOSIS — R609 Edema, unspecified: Secondary | ICD-10-CM

## 2013-08-22 NOTE — Assessment & Plan Note (Signed)
Continued worsening leg edema likely from diastolic CHF, contribution from her atrial flutter. Suggested she continue on her Lasix

## 2013-08-22 NOTE — Assessment & Plan Note (Signed)
Continues to be in atrial flutter. Rate has improved on amiodarone 2 times a day dosing. We'll start digoxin 0.25 mg daily. We will schedule cardioversion for next week. She is symptomatic with shortness of breath, cough when she lays flat. She has been on anticoagulation

## 2013-08-22 NOTE — Assessment & Plan Note (Signed)
Currently with no symptoms of angina. No further workup at this time. Continue current medication regimen. 

## 2013-08-22 NOTE — Patient Instructions (Addendum)
Your physician has recommended that you have a Cardioversion (DCCV).  Electrical Cardioversion uses a jolt of electricity to your heart either through paddles or wired patches attached to your chest.  This is a controlled, usually prescheduled, procedure.  Defibrillation is done under light anesthesia in the hospital, and you usually go home the day of the procedure.  This is done to get your heart back into a normal rhythm.  You are not awake for the procedure.  Please see the instruction sheet given to you today.  Please arrive at the [facility] entrance Tuesday, Nov 25 @ 6:30am. Nothing to eat or drink after midnight the night before. You may take your meds with a sip of water that morning. You will need someone to drive you home after the procedure.  Followup in one month

## 2013-08-22 NOTE — Progress Notes (Signed)
Patient ID: [name], female    DOB: 1941/01/09, 72 y.o.   MRN: [id number]  HPI Comments: 72 year old female with CAD, neuropathy in the feet, recent episodes of mental status changes and unresponsiveness secondary to polypharmacy, seen at [facility] with cardiac catheterization performed for elevated cardiac enzymes showing a totally occluded RCA that was chronic, mid circumflex with 95% stenosis just before OM-1, 50% mid LAD,  EF was 55%. PCI performed on August 16 with DES stent to the left circumflex, additional episodes of unresponsiveness from her medications, one of the episodes requiring intubation for airway protection, also with episodes of atrial flutter, who presents to the clinic for routine followup. Previous admissions to the hospital for mental status changes felt to be secondary to polypharmacy. Also has a history of mild CO2 retention, obstructive sleep apnea.  Admission to the hospital April 2014 with shortness of breath, atrial fibrillation, acute diastolic CHF, urinary tract infection. She did convert to normal sinus rhythm the hospital after given Cardizem IV  She continues to live at [facility]. She reports that she does not walk secondary to lower extremity neuropathy and weakness. She has  significant restless leg symptoms during the daytime.   On her last clinic visit, she had weight gain, worsening leg edema, tachycardia over the past several weeks. Upper respiratory infection started several weeks ago. Lasix was increased up to 40 mg daily two weeks ago.  In followup today, she continues to have significant leg edema, shortness of breath when she lays flat, some cough. She does not warfarin much baseline . She tolerated amiodarone load, now taking amiodarone 200 mg twice a day.   EKG shows atrial flutter with ventricular rate 127 beats per minute, nonspecific ST abnormality          Outpatient Encounter Prescriptions as of 08/22/2013  Medication Sig  .  acetaminophen (TYLENOL) 650 MG suppository Place 650 mg rectally every 4 (four) hours as needed.  Marland Kitchen albuterol (PROVENTIL) (2.5 MG/3ML) 0.083% nebulizer solution Take 2.5 mg by nebulization every 4 (four) hours as needed for wheezing or shortness of breath.  Marland Kitchen amiodarone (PACERONE) 200 MG tablet Take 200 mg by mouth 2 (two) times daily.  Marland Kitchen aspirin 81 MG tablet Take 81 mg by mouth daily.  Marland Kitchen buPROPion (WELLBUTRIN SR) 150 MG 12 hr tablet Take 150 mg by mouth daily.  . Cholecalciferol (VITAMIN D3) 50000 UNITS CAPS Take by mouth once a week.  . diltiazem (CARDIZEM) 30 MG tablet Take 60 mg by mouth 2 (two) times daily.   . DULoxetine (CYMBALTA) 60 MG capsule Take 60 mg by mouth 2 (two) times daily.    . furosemide (LASIX) 20 MG tablet Take 40 mg by mouth 2 (two) times daily.   Marland Kitchen gabapentin (NEURONTIN) 100 MG capsule Take 100 mg by mouth daily.  . Hypromellose (ARTIFICIAL TEARS) 0.4 % SOLN Apply to eye.  Marland Kitchen ipratropium-albuterol (DUONEB) 0.5-2.5 (3) MG/3ML SOLN Take 3 mLs by nebulization as needed.  Marland Kitchen levothyroxine (SYNTHROID, LEVOTHROID) 200 MCG tablet Take 200 mcg by mouth daily before breakfast.  . lidocaine (LIDODERM) 5 % Place 1 patch onto the skin daily. Remove & Discard patch within 12 hours or as directed by MD  . MAGNESIUM OXIDE PO Take 400 mg by mouth daily.   . metFORMIN (GLUCOPHAGE) 500 MG tablet Take 250 mg by mouth daily with breakfast.   . metoprolol tartrate (LOPRESSOR) 25 MG tablet Take 25 mg by mouth 2 (two) times daily.   . montelukast (  SINGULAIR) 10 MG tablet Take 10 mg by mouth at bedtime.  . Multiple Vitamin (MULTIVITAMIN) tablet Take 1 tablet by mouth daily.    . ondansetron (ZOFRAN) 4 MG tablet Take 4 mg by mouth every 6 (six) hours as needed for nausea or vomiting.  Marland Kitchen oxyCODONE-acetaminophen (PERCOCET) 5-325 MG per tablet Take 1 tablet by mouth every 4 (four) hours as needed.    . pantoprazole (PROTONIX) 40 MG tablet Take 40 mg by mouth daily.  Bertram Gala Glycol-Propyl Glycol  (SYSTANE) 0.4-0.3 % SOLN Apply to eye 4 (four) times daily.  . potassium chloride (K-DUR,KLOR-CON) 10 MEQ tablet Take 20 mEq by mouth daily.   . Rivaroxaban (XARELTO) 20 MG TABS tablet Take 20 mg by mouth daily with supper.  Marland Kitchen rOPINIRole (REQUIP) 1 MG tablet Take 1 mg by mouth 2 (two) times daily.   . sennosides-docusate sodium (SENOKOT-S) 8.6-50 MG tablet Take 1 tablet by mouth 2 (two) times daily.  . simvastatin (ZOCOR) 20 MG tablet Take 20 mg by mouth at bedtime.    Marland Kitchen spironolactone (ALDACTONE) 25 MG tablet Take 12.5 mg by mouth daily.  . tamsulosin (FLOMAX) 0.4 MG CAPS capsule Take 0.4 mg by mouth daily.  Marland Kitchen tiotropium (SPIRIVA) 18 MCG inhalation capsule Place 18 mcg into inhaler and inhale daily.  . vitamin C (ASCORBIC ACID) 500 MG tablet Take 500 mg by mouth daily.  . [DISCONTINUED] QUEtiapine Fumarate (SEROQUEL PO) Take 75 mg by mouth daily.     Review of Systems  Constitutional: Negative.   HENT: Negative.   Eyes: Negative.   Respiratory: Positive for shortness of breath.   Cardiovascular: Positive for leg swelling.  Gastrointestinal: Negative.   Endocrine: Negative.   Musculoskeletal: Positive for arthralgias, back pain and gait problem.  Skin: Negative.   Allergic/Immunologic: Negative.   Neurological: Negative.   Hematological: Negative.   Psychiatric/Behavioral: Negative.   All other systems reviewed and are negative.   BP 120/72  Pulse 127  Ht 5' 5.5" (1.664 m)  Wt 280 lb (127.007 kg)  BMI 45.87 kg/m2  Physical Exam  Nursing note and vitals reviewed. Constitutional: She is oriented to person, place, and time. She appears well-developed and well-nourished.  Obese woman, sitting in a wheelchair, unable to ambulate well.  HENT:  Head: Normocephalic.  Nose: Nose normal.  Mouth/Throat: Oropharynx is clear and moist.  Eyes: Conjunctivae are normal. Pupils are equal, round, and reactive to light.  Neck: Normal range of motion. Neck supple. No JVD present.   Cardiovascular: Regular rhythm, S1 normal, S2 normal and intact distal pulses.  Tachycardia present.  Exam reveals no gallop and no friction rub.   Murmur heard. Pulmonary/Chest: Effort normal and breath sounds normal. No respiratory distress. She has no wheezes. She has no rales. She exhibits no tenderness.  Abdominal: Soft. Bowel sounds are normal. She exhibits no distension. There is no tenderness.  Musculoskeletal: Normal range of motion. She exhibits no edema and no tenderness.  Lymphadenopathy:    She has no cervical adenopathy.  Neurological: She is alert and oriented to person, place, and time. Coordination normal.  Skin: Skin is warm and dry. No rash noted. No erythema.  Psychiatric: She has a normal mood and affect. Her behavior is normal. Judgment and thought content normal.    Assessment and Plan

## 2013-08-22 NOTE — Assessment & Plan Note (Signed)
No changes to the medications were made. 

## 2013-08-24 ENCOUNTER — Telehealth: Payer: Self-pay

## 2013-08-24 DIAGNOSIS — I4892 Unspecified atrial flutter: Secondary | ICD-10-CM

## 2013-08-24 DIAGNOSIS — I2510 Atherosclerotic heart disease of native coronary artery without angina pectoris: Secondary | ICD-10-CM

## 2013-08-24 NOTE — Telephone Encounter (Signed)
Spoke w/ pt's nurse.  She states that pt has not had any labs drawn and they have not received a fax from [place].

## 2013-08-26 ENCOUNTER — Telehealth: Payer: Self-pay

## 2013-08-26 NOTE — Telephone Encounter (Signed)
Spoke w/ [facility] Rignor, RN who reports that pt's blood was drawn on 11/19 and sent to [facility], but she has not gotten any results. She called and was told that they had none to report.  Called [facility] myself was informed that the last results they had for pt were in September.  Spoke w/ [facility] and asked her to redraw pre-procedure labs.  She stated that she would and that she did not need a new order.

## 2013-08-29 NOTE — Telephone Encounter (Signed)
Called [facility] and was informed that they do not have any new results for pt. Called [facility], spoke w/ weekend nurse who was unfamiliar w/ [name], so asked that they get [name] out of a meeting. Informed her that [name] is scheduled for cardioversion tomorrow at 7:30 and if they are unable to get her labs, I will need to get pt over here today for blood draw. [name] reports that labs were drawn stat on Friday after I spoke w/ her.  She is to look into this and call me back.

## 2013-08-30 ENCOUNTER — Ambulatory Visit: Payer: Self-pay | Admitting: Cardiovascular Disease

## 2013-08-30 DIAGNOSIS — I4892 Unspecified atrial flutter: Secondary | ICD-10-CM

## 2013-09-06 ENCOUNTER — Encounter: Payer: Self-pay | Admitting: Cardiovascular Disease

## 2013-09-06 ENCOUNTER — Ambulatory Visit (INDEPENDENT_AMBULATORY_CARE_PROVIDER_SITE_OTHER): Payer: PRIVATE HEALTH INSURANCE | Admitting: Cardiovascular Disease

## 2013-09-06 ENCOUNTER — Ambulatory Visit: Payer: Self-pay

## 2013-09-06 VITALS — BP 130/60 | HR 72 | Ht 66.0 in | Wt 280.0 lb

## 2013-09-06 DIAGNOSIS — I4892 Unspecified atrial flutter: Secondary | ICD-10-CM

## 2013-09-06 DIAGNOSIS — I509 Heart failure, unspecified: Secondary | ICD-10-CM

## 2013-09-06 DIAGNOSIS — E785 Hyperlipidemia, unspecified: Secondary | ICD-10-CM

## 2013-09-06 DIAGNOSIS — I2510 Atherosclerotic heart disease of native coronary artery without angina pectoris: Secondary | ICD-10-CM

## 2013-09-06 DIAGNOSIS — R609 Edema, unspecified: Secondary | ICD-10-CM

## 2013-09-06 DIAGNOSIS — I5032 Chronic diastolic (congestive) heart failure: Secondary | ICD-10-CM

## 2013-09-06 NOTE — Assessment & Plan Note (Signed)
Currently with no symptoms of angina. No further workup at this time. Continue current medication regimen. 

## 2013-09-06 NOTE — Patient Instructions (Signed)
You are doing well. Please change your foley every two weeks (hx of UTIs, appears dirty)  Please decrease the amiodarone down to 200 mg once a day  Please call us if you have new issues that need to be addressed before your next appt.  Your physician wants you to follow-up in: 3 months.  You will receive a reminder letter in the mail two months in advance. If you don't receive a letter, please call our office to schedule the follow-up appointment.

## 2013-09-06 NOTE — Assessment & Plan Note (Signed)
Suggested she stay on her simvastatin 

## 2013-09-06 NOTE — Progress Notes (Signed)
Patient ID: [name], female    DOB: 03/16/1941, 72 y.o.   MRN: [id number]  HPI Comments: 72 year old female with CAD, neuropathy in the feet, recent episodes of mental status changes and unresponsiveness secondary to polypharmacy, seen at [facility] with cardiac catheterization performed for elevated cardiac enzymes showing a totally occluded RCA that was chronic, mid circumflex with 95% stenosis just before OM-1, 50% mid LAD,  EF was 55%. PCI performed on August 16 with DES stent to the left circumflex, additional episodes of unresponsiveness from her medications, one of the episodes requiring intubation for airway protection, also with episodes of atrial flutter, who presents to the clinic for routine followup. Previous admissions to the hospital for mental status changes felt to be secondary to polypharmacy. Also has a history of mild CO2 retention, obstructive sleep apnea.  Admission to the hospital April 2014 with shortness of breath, atrial fibrillation, acute diastolic CHF, urinary tract infection. She did convert to normal sinus rhythm in the hospital after given Cardizem IV  She lives at [facility].  does not walk secondary to lower extremity neuropathy and weakness. significant restless leg symptoms during the daytime.   Recent weight gain, worsening leg edema, tachycardia over the past several weeks from atrial fibrillation, Upper respiratory infection likely started her arrhythmia . Lasix was increased up to 40 mg daily. She drinks significant water and coffee. Started on amiodarone load then down to 200 mg twice a day. As she was symptomatic in atrial fibrillation/ converted to atrial flutter . cardioversion was performed last week for atrial flutter, which was successful in restoring normal sinus rhythm.  In followup today, continues to have shortness of breath. Weight still 280 pounds.  She is short of breath when supine in followup today  EKG today shows normal sinus rhythm with  rate 72 beats per minute, no significant ST or T wave changes            Outpatient Encounter Prescriptions as of 09/06/2013  Medication Sig  . acetaminophen (TYLENOL) 650 MG suppository Place 650 mg rectally every 4 (four) hours as needed.  Marland Kitchen albuterol (PROVENTIL) (2.5 MG/3ML) 0.083% nebulizer solution Take 2.5 mg by nebulization every 4 (four) hours as needed for wheezing or shortness of breath.  Marland Kitchen amiodarone (PACERONE) 200 MG tablet Take 200 mg by mouth 2 (two) times daily.  Marland Kitchen aspirin 81 MG tablet Take 81 mg by mouth daily.  Marland Kitchen buPROPion (WELLBUTRIN SR) 150 MG 12 hr tablet Take 150 mg by mouth daily.  . Cholecalciferol (VITAMIN D3) 50000 UNITS CAPS Take by mouth once a week.  . digoxin (LANOXIN) 0.125 MG tablet Take 0.125 mg by mouth daily.  Marland Kitchen diltiazem (CARDIZEM) 30 MG tablet Take 60 mg by mouth 2 (two) times daily.   . DULoxetine (CYMBALTA) 60 MG capsule Take 60 mg by mouth 2 (two) times daily.    . furosemide (LASIX) 20 MG tablet Take 40 mg by mouth 2 (two) times daily.   Marland Kitchen gabapentin (NEURONTIN) 100 MG capsule Take 100 mg by mouth daily.  . Hypromellose (ARTIFICIAL TEARS) 0.4 % SOLN Apply to eye.  Marland Kitchen ipratropium-albuterol (DUONEB) 0.5-2.5 (3) MG/3ML SOLN Take 3 mLs by nebulization as needed.  Marland Kitchen levothyroxine (SYNTHROID, LEVOTHROID) 200 MCG tablet Take 200 mcg by mouth daily before breakfast.  . lidocaine (LIDODERM) 5 % Place 1 patch onto the skin daily. Remove & Discard patch within 12 hours or as directed by MD  . Lubiprostone (AMITIZA PO) Take by mouth at bedtime.  Marland Kitchen  MAGNESIUM OXIDE PO Take 400 mg by mouth daily.   . Melatonin 1 MG TABS Take by mouth at bedtime.  . metFORMIN (GLUCOPHAGE) 500 MG tablet Take 250 mg by mouth daily with breakfast.   . metoprolol tartrate (LOPRESSOR) 25 MG tablet Take 25 mg by mouth 2 (two) times daily.   . montelukast (SINGULAIR) 10 MG tablet Take 10 mg by mouth at bedtime.  . Multiple Vitamin (MULTIVITAMIN) tablet Take 1 tablet by mouth daily.     . ondansetron (ZOFRAN) 4 MG tablet Take 4 mg by mouth every 6 (six) hours as needed for nausea or vomiting.  Marland Kitchen oxyCODONE-acetaminophen (PERCOCET) 5-325 MG per tablet Take 1 tablet by mouth every 4 (four) hours as needed.    . pantoprazole (PROTONIX) 40 MG tablet Take 40 mg by mouth daily.  Bertram Gala Glycol-Propyl Glycol (SYSTANE) 0.4-0.3 % SOLN Apply to eye 4 (four) times daily.  . potassium chloride (K-DUR,KLOR-CON) 10 MEQ tablet Take 20 mEq by mouth daily.   . Rivaroxaban (XARELTO) 20 MG TABS tablet Take 20 mg by mouth daily with supper.  Marland Kitchen rOPINIRole (REQUIP) 1 MG tablet Take 1 mg by mouth 2 (two) times daily.   . sennosides-docusate sodium (SENOKOT-S) 8.6-50 MG tablet Take 1 tablet by mouth 2 (two) times daily.  . simvastatin (ZOCOR) 20 MG tablet Take 20 mg by mouth at bedtime.    Marland Kitchen spironolactone (ALDACTONE) 25 MG tablet Take 12.5 mg by mouth daily.  . tamsulosin (FLOMAX) 0.4 MG CAPS capsule Take 0.4 mg by mouth daily.  Marland Kitchen tiotropium (SPIRIVA) 18 MCG inhalation capsule Place 18 mcg into inhaler and inhale daily.  . vitamin C (ASCORBIC ACID) 500 MG tablet Take 500 mg by mouth daily.     Review of Systems  Constitutional: Negative.   HENT: Negative.   Eyes: Negative.   Respiratory: Positive for shortness of breath.   Cardiovascular: Positive for leg swelling.  Gastrointestinal: Negative.   Endocrine: Negative.   Musculoskeletal: Positive for arthralgias, back pain and gait problem.  Skin: Negative.   Allergic/Immunologic: Negative.   Neurological: Negative.   Hematological: Negative.   Psychiatric/Behavioral: Negative.   All other systems reviewed and are negative.   BP 130/60  Pulse 72  Ht 5\' 6"  (1.676 m)  Wt 280 lb (127.007 kg)  BMI 45.21 kg/m2  Physical Exam  Nursing note and vitals reviewed. Constitutional: She is oriented to person, place, and time. She appears well-developed and well-nourished.  Obese woman, sitting in a wheelchair, unable to ambulate well.   HENT:  Head: Normocephalic.  Nose: Nose normal.  Mouth/Throat: Oropharynx is clear and moist.  Eyes: Conjunctivae are normal. Pupils are equal, round, and reactive to light.  Neck: Normal range of motion. Neck supple. No JVD present.  Cardiovascular: Regular rhythm, S1 normal, S2 normal and intact distal pulses.  Tachycardia present.  Exam reveals no gallop and no friction rub.   Murmur heard. Pulmonary/Chest: Effort normal and breath sounds normal. No respiratory distress. She has no wheezes. She has no rales. She exhibits no tenderness.  Abdominal: Soft. Bowel sounds are normal. She exhibits no distension. There is no tenderness.  Musculoskeletal: Normal range of motion. She exhibits no edema and no tenderness.  Lymphadenopathy:    She has no cervical adenopathy.  Neurological: She is alert and oriented to person, place, and time. Coordination normal.  Skin: Skin is warm and dry. No rash noted. No erythema.  Psychiatric: She has a normal mood and affect. Her behavior is normal. Judgment  and thought content normal.    Assessment and Plan

## 2013-09-06 NOTE — Assessment & Plan Note (Signed)
We have recommended she stay on Lasix 40 mg twice a day, decrease her fluid intake

## 2013-09-06 NOTE — Assessment & Plan Note (Signed)
Maintaining normal sinus rhythm after cardioversion last week. Suggested she decrease the amiodarone down to 200 mg daily, continue metoprolol

## 2013-09-06 NOTE — Assessment & Plan Note (Signed)
Chronic diastolic CHF. Recommend she stay on Lasix 40 mg twice a day. Decrease her fluid intake

## 2013-09-22 ENCOUNTER — Encounter: Payer: Self-pay | Admitting: *Deleted

## 2013-09-24 ENCOUNTER — Other Ambulatory Visit: Payer: Self-pay | Admitting: Gastroenterology

## 2013-09-24 LAB — WBCS, STOOL

## 2013-09-27 LAB — STOOL CULTURE

## 2013-10-04 ENCOUNTER — Ambulatory Visit: Payer: Self-pay | Admitting: Gastroenterology

## 2013-10-21 ENCOUNTER — Telehealth: Payer: Self-pay | Admitting: *Deleted

## 2013-10-21 NOTE — Telephone Encounter (Signed)
Called [facility] at [facility] and spoke with her about patients appointment, she is in a wheelchair but able to assist herself to the table with minor help from others, pt also does not need POA, she can sign forms herself.

## 2013-10-24 ENCOUNTER — Encounter: Payer: Self-pay | Admitting: General Surgery

## 2013-10-24 ENCOUNTER — Ambulatory Visit (INDEPENDENT_AMBULATORY_CARE_PROVIDER_SITE_OTHER): Payer: PRIVATE HEALTH INSURANCE | Admitting: General Surgery

## 2013-10-24 VITALS — BP 136/76 | HR 70 | Resp 18 | Ht 66.0 in | Wt 266.0 lb

## 2013-10-24 DIAGNOSIS — R109 Unspecified abdominal pain: Secondary | ICD-10-CM

## 2013-10-24 DIAGNOSIS — K8020 Calculus of gallbladder without cholecystitis without obstruction: Secondary | ICD-10-CM

## 2013-10-24 NOTE — Progress Notes (Signed)
Patient ID: [name], female   DOB: [birth year], 73 y.o.   MRN: [id number]  Chief Complaint  Patient presents with  . Other    evaluation of gallstones    HPI [name] is a 73 y.o. female here today for an evaluation of gallstones. Patient had a ct done on 06/16/13. She states she has been having abdominal pain for about four months now. This is located in the midabdomen without radiation to the right upper quadrant, subscapular area or epigastrium. She states the pain comes about twice a weeks and feels like cramps. Patient has had a lot of diarrhea and some nausea when the pain is severe, no vomiting. She has not appreciated any dietary correlation between her abdominal discomfort and the episodes of abdominal cramping.  She reports in the past she would normally have a bowel movement daily. She had a CT scan for abdominal pain in November 2014 with findings of a moderate amount of stool. She was placed on Amitiza  8 mg twice a day which resulted in diarrhea. Bowel movements were reported as 5-6 times per day on her 09/22/2013 exam. Today she reports bowel movements have decreased in frequency to about 3-4 per day. No blood or mucus reported.  The patient was diagnosed with septic shock from C. difficile colitis in August 2012. She required intubation and vasopressors. Echocardiography did not show evidence of valvular vegetations.  The patient has been in the assisted-living facility since her lower extremity neuropathy precludes ambulation.  She has an extensive past medical history which was reviewed.  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  HPI  Past Medical History  Diagnosis Date  . Thyroid disease   . Depression   . Hypertension   . Lumbar disc disease   . Cervical disc disease   . Dyslipidemia   . History of ETOH abuse     dependence with withdrawl seizures  . MI, acute, non ST segment elevation   . Sleep apnea   .  Neuropathy   . Anemia   . GERD (gastroesophageal reflux disease)   . Diabetes mellitus without complication   . Restless leg syndrome   . Unspecified psychosis   . CHF (congestive heart failure)   . COPD (chronic obstructive pulmonary disease)   . Atrial fibrillation   . Acute respiratory failure   . Hyperlipidemia     Past Surgical History  Procedure Laterality Date  . Cervical disc surgery  03/2011    [facility]  . Thyroidectomy    . Tonsillectomy    . Replacement total knee      right knee  . Cardiac catheterization  05/2011    [facility] by Dr Mariah Milling  . Coronary angioplasty  05/2011    stent  . Cardioversion    . Colonoscopy      Family History  Problem Relation Age of Onset  . Stroke Mother   . Cancer Father   . Hepatitis Brother     b    Social History History  Substance Use Topics  . Smoking status: Current Every Day Smoker -- 0.75 packs/day for 54 years    Types: Cigarettes  . Smokeless tobacco: Never Used  . Alcohol Use: No    Allergies  Allergen Reactions  . Amiodarone     Intolerance   . Latex     Current Outpatient Prescriptions  Medication Sig Dispense Refill  . acetaminophen (TYLENOL) 650 MG  suppository Place 650 mg rectally every 4 (four) hours as needed.      Marland Kitchen albuterol (PROVENTIL) (2.5 MG/3ML) 0.083% nebulizer solution Take 2.5 mg by nebulization every 4 (four) hours as needed for wheezing or shortness of breath.      Marland Kitchen amiodarone (PACERONE) 200 MG tablet Take 200 mg by mouth 2 (two) times daily.      Marland Kitchen aspirin 81 MG tablet Take 81 mg by mouth daily.      Marland Kitchen buPROPion (WELLBUTRIN SR) 150 MG 12 hr tablet Take 150 mg by mouth daily.      . Cholecalciferol (VITAMIN D3) 50000 UNITS CAPS Take by mouth once a week.      . collagenase (SANTYL) ointment Apply 1 application topically daily.      . digoxin (LANOXIN) 0.125 MG tablet Take 0.125 mg by mouth daily.      Marland Kitchen diltiazem (CARDIZEM) 30 MG tablet Take 60 mg by mouth 2 (two) times daily.       .  DULoxetine (CYMBALTA) 60 MG capsule Take 60 mg by mouth 2 (two) times daily.        . Fluticasone-Salmeterol (ADVAIR DISKUS IN) Inhale 1 puff into the lungs 2 (two) times daily.      . furosemide (LASIX) 20 MG tablet Take 40 mg by mouth 2 (two) times daily.       Marland Kitchen gabapentin (NEURONTIN) 100 MG capsule Take 100 mg by mouth daily.      . Hypromellose (ARTIFICIAL TEARS) 0.4 % SOLN Apply to eye.      . latanoprost (XALATAN) 0.005 % ophthalmic solution Place 1 drop into both eyes at bedtime.      Marland Kitchen levothyroxine (SYNTHROID, LEVOTHROID) 200 MCG tablet Take 200 mcg by mouth daily before breakfast.      . lidocaine (LIDODERM) 5 % Place 1 patch onto the skin daily. Remove & Discard patch within 12 hours or as directed by MD      . loperamide (IMODIUM A-D) 2 MG tablet Take 2 mg by mouth 4 (four) times daily as needed for diarrhea or loose stools.      Marland Kitchen MAGNESIUM OXIDE PO Take 400 mg by mouth daily.       . Melatonin 1 MG TABS Take by mouth at bedtime.      . metFORMIN (GLUCOPHAGE) 500 MG tablet Take 250 mg by mouth daily with breakfast.       . metoprolol tartrate (LOPRESSOR) 25 MG tablet Take 25 mg by mouth 2 (two) times daily.       . metroNIDAZOLE (FLAGYL) 500 MG tablet Take 500 mg by mouth 3 (three) times daily.      . montelukast (SINGULAIR) 10 MG tablet Take 10 mg by mouth at bedtime.      . Multiple Vitamin (MULTIVITAMIN) tablet Take 1 tablet by mouth daily.        . ondansetron (ZOFRAN) 4 MG tablet Take 4 mg by mouth every 6 (six) hours as needed for nausea or vomiting.      Marland Kitchen oxyCODONE-acetaminophen (PERCOCET) 5-325 MG per tablet Take 1 tablet by mouth every 4 (four) hours as needed.        . pantoprazole (PROTONIX) 40 MG tablet Take 40 mg by mouth daily.      . Polyvinyl Alcohol-Povidone (FRESHKOTE) 2.7-2 % SOLN Apply 1 drop to eye 3 (three) times daily.      . potassium chloride (K-DUR,KLOR-CON) 10 MEQ tablet Take 20 mEq by mouth daily.       Marland Kitchen  Probiotic Product (PROBIOTIC DAILY PO) Take 1  tablet by mouth daily.      . Rivaroxaban (XARELTO) 20 MG TABS tablet Take 20 mg by mouth daily with supper.      Marland Kitchen. rOPINIRole (REQUIP) 1 MG tablet Take 1 mg by mouth 2 (two) times daily.       Marland Kitchen. SALINE NASAL SPRAY NA Place 1 spray into the nose at bedtime.      . simvastatin (ZOCOR) 20 MG tablet Take 20 mg by mouth at bedtime.        Marland Kitchen. spironolactone (ALDACTONE) 25 MG tablet Take 12.5 mg by mouth daily.      . tamsulosin (FLOMAX) 0.4 MG CAPS capsule Take 0.4 mg by mouth daily.      Marland Kitchen. tiotropium (SPIRIVA) 18 MCG inhalation capsule Place 18 mcg into inhaler and inhale daily.      . vitamin C (ASCORBIC ACID) 500 MG tablet Take 500 mg by mouth daily.       No current facility-administered medications for this visit.    Review of Systems Review of Systems  Constitutional: Negative.   Respiratory: Negative.   Cardiovascular: Negative.   Gastrointestinal: Positive for nausea, abdominal pain and diarrhea. Negative for vomiting, constipation, blood in stool, abdominal distention, anal bleeding and rectal pain.    Blood pressure 136/76, pulse 70, resp. rate 18, height 5\' 6"  (1.676 m), weight 266 lb (120.657 kg).  Physical Exam Physical Exam  Constitutional: She is oriented to person, place, and time. She appears well-developed and well-nourished.  Eyes: Conjunctivae are normal.  Neck: Neck supple.  Cardiovascular: Normal rate and regular rhythm.   Murmur heard. Pulmonary/Chest: Effort normal and breath sounds normal.  Abdominal: Soft. Normal appearance and bowel sounds are normal. There is no tenderness.  Neurological: She is alert and oriented to person, place, and time.  Skin: Skin is warm and dry.    Data Reviewed Notes from the gastroenterology department completed by Janey GreaserJohn Harrison, nurse practitioner from 09/22/2013 were reviewed. CT of the abdomen dated Feb 23, 2013 showed evidence of calcified gallstones. The exam was obtained for right flank pain. No ureterolithiasis was seen.  Chest CT dated 06/02/2013 for reports of a right hilar infiltrate were negative. CT scan of the abdomen and pelvis dated 06/02/2013 obtained for change in bowel habits and diarrhea as well as abdominal pain showed calcified gallstones without evidence of cholecystitis. Upper GI with small bowel series dated 10/04/2013 could not be completed due to the patient's inability to stand or lay supine.   Assessment    Vague abdominal pain, not clearly related to biliary colic.    Plan    The patient is exceptionally high risk for any surgical intervention. Even with her history of diabetes, and the possibility that she would be unable to sense an inflammatory process, prophylactic cholecystectomy carries a prohibitive risk.  Without clear evidence of correlation of symptoms with the biliary tract, elective cholecystectomy would not be offered.       Earline MayotteByrnett, Saajan Willmon W 10/25/2013, 4:29 PM

## 2013-10-24 NOTE — Patient Instructions (Signed)
Patient to return as needed. 

## 2013-10-25 ENCOUNTER — Ambulatory Visit: Payer: Self-pay | Admitting: Urology

## 2013-10-25 DIAGNOSIS — K8020 Calculus of gallbladder without cholecystitis without obstruction: Secondary | ICD-10-CM | POA: Insufficient documentation

## 2013-10-25 DIAGNOSIS — R109 Unspecified abdominal pain: Secondary | ICD-10-CM | POA: Insufficient documentation

## 2013-11-23 ENCOUNTER — Telehealth: Payer: Self-pay

## 2013-11-23 NOTE — Telephone Encounter (Signed)
Nurse called and states pt needs clearance for urology surgery scheculed for 12/09/2013. Please call after 1:30

## 2013-11-24 ENCOUNTER — Telehealth: Payer: Self-pay | Admitting: *Deleted

## 2013-11-24 NOTE — Telephone Encounter (Signed)
Paperwork is on Dr. Windell HummingbirdGollan's desk to be completed. Pt has appt 12/06/13 to see Dr. Mariah MillingGollan.

## 2013-11-24 NOTE — Telephone Encounter (Signed)
[facility] nurse Karen KitchensBobbie called needing surgical clearance that [facility].. This needs to be done stat. Please call [employer]bie

## 2013-11-28 NOTE — Telephone Encounter (Signed)
Cleared for surgery May need to hold xarelto 2 days prior to procedure Restart anticoagulation 24 hours after procedure

## 2013-11-30 NOTE — Telephone Encounter (Signed)
Spoke w/ Karen KitchensBobbie.  Advised her of Dr. Windell HummingbirdGollan's recommendations and that I am faxing completed form to her office.

## 2013-12-05 ENCOUNTER — Ambulatory Visit: Payer: Self-pay | Admitting: Urology

## 2013-12-05 DIAGNOSIS — J984 Other disorders of lung: Secondary | ICD-10-CM

## 2013-12-05 DIAGNOSIS — I2510 Atherosclerotic heart disease of native coronary artery without angina pectoris: Secondary | ICD-10-CM

## 2013-12-05 LAB — BASIC METABOLIC PANEL
ANION GAP: 4 — AB (ref 7–16)
BUN: 60 mg/dL — ABNORMAL HIGH (ref 7–18)
CREATININE: 1.8 mg/dL — AB (ref 0.60–1.30)
Calcium, Total: 9.5 mg/dL (ref 8.5–10.1)
Chloride: 98 mmol/L (ref 98–107)
Co2: 28 mmol/L (ref 21–32)
EGFR (African American): 32 — ABNORMAL LOW
GFR CALC NON AF AMER: 28 — AB
GLUCOSE: 315 mg/dL — AB (ref 65–99)
Osmolality: 290 (ref 275–301)
Potassium: 5 mmol/L (ref 3.5–5.1)
Sodium: 130 mmol/L — ABNORMAL LOW (ref 136–145)

## 2013-12-05 LAB — CBC WITH DIFFERENTIAL/PLATELET
Basophil #: 0.1 10*3/uL (ref 0.0–0.1)
Basophil %: 0.6 %
EOS PCT: 1.9 %
Eosinophil #: 0.3 10*3/uL (ref 0.0–0.7)
HCT: 41.7 % (ref 35.0–47.0)
HGB: 13.4 g/dL (ref 12.0–16.0)
Lymphocyte #: 2 10*3/uL (ref 1.0–3.6)
Lymphocyte %: 14.4 %
MCH: 28.2 pg (ref 26.0–34.0)
MCHC: 32.1 g/dL (ref 32.0–36.0)
MCV: 88 fL (ref 80–100)
MONO ABS: 1.3 x10 3/mm — AB (ref 0.2–0.9)
Monocyte %: 9.2 %
NEUTROS ABS: 10.1 10*3/uL — AB (ref 1.4–6.5)
Neutrophil %: 73.9 %
Platelet: 295 10*3/uL (ref 150–440)
RBC: 4.74 10*6/uL (ref 3.80–5.20)
RDW: 16.4 % — ABNORMAL HIGH (ref 11.5–14.5)
WBC: 13.7 10*3/uL — ABNORMAL HIGH (ref 3.6–11.0)

## 2013-12-06 ENCOUNTER — Ambulatory Visit: Payer: PRIVATE HEALTH INSURANCE | Admitting: Cardiovascular Disease

## 2013-12-09 ENCOUNTER — Ambulatory Visit: Payer: Self-pay | Admitting: Urology

## 2013-12-13 LAB — PATHOLOGY REPORT

## 2013-12-30 ENCOUNTER — Ambulatory Visit: Payer: Self-pay | Admitting: Gastroenterology

## 2014-01-02 LAB — PATHOLOGY REPORT

## 2014-01-14 ENCOUNTER — Emergency Department: Payer: Self-pay | Admitting: Emergency Medicine

## 2014-01-28 LAB — CBC
HCT: 36.1 % (ref 35.0–47.0)
HGB: 11.7 g/dL — ABNORMAL LOW (ref 12.0–16.0)
MCH: 29.4 pg (ref 26.0–34.0)
MCHC: 32.4 g/dL (ref 32.0–36.0)
MCV: 91 fL (ref 80–100)
Platelet: 292 10*3/uL (ref 150–440)
RBC: 3.98 10*6/uL (ref 3.80–5.20)
RDW: 16.7 % — ABNORMAL HIGH (ref 11.5–14.5)
WBC: 12 10*3/uL — ABNORMAL HIGH (ref 3.6–11.0)

## 2014-01-28 LAB — COMPREHENSIVE METABOLIC PANEL
Albumin: 3.1 g/dL — ABNORMAL LOW (ref 3.4–5.0)
Alkaline Phosphatase: 100 U/L
Anion Gap: 7 (ref 7–16)
BUN: 62 mg/dL — ABNORMAL HIGH (ref 7–18)
Bilirubin,Total: 0.5 mg/dL (ref 0.2–1.0)
CALCIUM: 8.9 mg/dL (ref 8.5–10.1)
CHLORIDE: 101 mmol/L (ref 98–107)
Co2: 25 mmol/L (ref 21–32)
Creatinine: 2.66 mg/dL — ABNORMAL HIGH (ref 0.60–1.30)
EGFR (African American): 20 — ABNORMAL LOW
EGFR (Non-African Amer.): 17 — ABNORMAL LOW
GLUCOSE: 170 mg/dL — AB (ref 65–99)
Osmolality: 288 (ref 275–301)
POTASSIUM: 5.4 mmol/L — AB (ref 3.5–5.1)
SGOT(AST): 15 U/L (ref 15–37)
SGPT (ALT): 17 U/L (ref 12–78)
Sodium: 133 mmol/L — ABNORMAL LOW (ref 136–145)
Total Protein: 8.2 g/dL (ref 6.4–8.2)

## 2014-01-28 LAB — PRO B NATRIURETIC PEPTIDE: B-TYPE NATIURETIC PEPTID: 6594 pg/mL — AB (ref 0–125)

## 2014-01-28 LAB — CK TOTAL AND CKMB (NOT AT ARMC)
CK, Total: 40 U/L
CK-MB: 1.2 ng/mL (ref 0.5–3.6)

## 2014-01-28 LAB — TROPONIN I: Troponin-I: <0.02 ng/mL

## 2014-01-28 LAB — LIPASE, BLOOD: Lipase: 91 U/L (ref 73–393)

## 2014-01-29 ENCOUNTER — Inpatient Hospital Stay: Payer: Self-pay | Admitting: Internal Medicine

## 2014-01-29 LAB — BASIC METABOLIC PANEL
ANION GAP: 7 (ref 7–16)
BUN: 60 mg/dL — ABNORMAL HIGH (ref 7–18)
CALCIUM: 8.7 mg/dL (ref 8.5–10.1)
CHLORIDE: 103 mmol/L (ref 98–107)
Co2: 25 mmol/L (ref 21–32)
Creatinine: 2.29 mg/dL — ABNORMAL HIGH (ref 0.60–1.30)
EGFR (African American): 24 — ABNORMAL LOW
GFR CALC NON AF AMER: 21 — AB
Glucose: 231 mg/dL — ABNORMAL HIGH (ref 65–99)
OSMOLALITY: 294 (ref 275–301)
Potassium: 5.1 mmol/L (ref 3.5–5.1)
Sodium: 135 mmol/L — ABNORMAL LOW (ref 136–145)

## 2014-01-29 LAB — URINALYSIS, COMPLETE
Bacteria: NONE SEEN
Bilirubin,UR: NEGATIVE
GLUCOSE, UR: NEGATIVE mg/dL (ref 0–75)
Hyaline Cast: 7
Ketone: NEGATIVE
Nitrite: NEGATIVE
PH: 5 (ref 4.5–8.0)
SPECIFIC GRAVITY: 1.018 (ref 1.003–1.030)
Squamous Epithelial: 1
WBC UR: 26 /HPF (ref 0–5)

## 2014-01-29 LAB — DIGOXIN LEVEL: Digoxin: 2.9 ng/mL

## 2014-01-30 DIAGNOSIS — I4891 Unspecified atrial fibrillation: Secondary | ICD-10-CM

## 2014-01-30 DIAGNOSIS — T460X5A Adverse effect of cardiac-stimulant glycosides and drugs of similar action, initial encounter: Secondary | ICD-10-CM

## 2014-01-30 DIAGNOSIS — N179 Acute kidney failure, unspecified: Secondary | ICD-10-CM

## 2014-01-30 LAB — BASIC METABOLIC PANEL
Anion Gap: 10 (ref 7–16)
Anion Gap: 7 (ref 7–16)
BUN: 51 mg/dL — ABNORMAL HIGH (ref 7–18)
BUN: 45 mg/dL — ABNORMAL HIGH (ref 7–18)
CALCIUM: 8.6 mg/dL (ref 8.5–10.1)
CHLORIDE: 106 mmol/L (ref 98–107)
CO2: 22 mmol/L (ref 21–32)
CREATININE: 1.72 mg/dL — AB (ref 0.60–1.30)
CREATININE: 1.62 mg/dL — AB (ref 0.60–1.30)
Calcium, Total: 9.2 mg/dL (ref 8.5–10.1)
Chloride: 105 mmol/L (ref 98–107)
Co2: 23 mmol/L (ref 21–32)
EGFR (Non-African Amer.): 29 — ABNORMAL LOW
GFR CALC AF AMER: 34 — AB
GFR CALC AF AMER: 37 — AB
GFR CALC NON AF AMER: 32 — AB
Glucose: 176 mg/dL — ABNORMAL HIGH (ref 65–99)
Glucose: 218 mg/dL — ABNORMAL HIGH (ref 65–99)
OSMOLALITY: 290 (ref 275–301)
Osmolality: 292 (ref 275–301)
POTASSIUM: 4.7 mmol/L (ref 3.5–5.1)
Potassium: 5.5 mmol/L — ABNORMAL HIGH (ref 3.5–5.1)
Sodium: 137 mmol/L (ref 136–145)
Sodium: 136 mmol/L (ref 136–145)

## 2014-01-30 LAB — CBC WITH DIFFERENTIAL/PLATELET
BASOS ABS: 0 10*3/uL (ref 0.0–0.1)
Basophil %: 0.3 %
Eosinophil #: 0.1 10*3/uL (ref 0.0–0.7)
Eosinophil %: 0.8 %
HCT: 38.9 % (ref 35.0–47.0)
HGB: 12.2 g/dL (ref 12.0–16.0)
Lymphocyte #: 1 10*3/uL (ref 1.0–3.6)
Lymphocyte %: 7.2 %
MCH: 29.5 pg (ref 26.0–34.0)
MCHC: 31.5 g/dL — AB (ref 32.0–36.0)
MCV: 94 fL (ref 80–100)
Monocyte #: 2.1 x10 3/mm — ABNORMAL HIGH (ref 0.2–0.9)
Monocyte %: 14.6 %
NEUTROS ABS: 11.2 10*3/uL — AB (ref 1.4–6.5)
Neutrophil %: 77.1 %
Platelet: 274 10*3/uL (ref 150–440)
RBC: 4.16 10*6/uL (ref 3.80–5.20)
RDW: 17.4 % — AB (ref 11.5–14.5)
WBC: 14.5 10*3/uL — ABNORMAL HIGH (ref 3.6–11.0)

## 2014-01-30 LAB — MAGNESIUM: Magnesium: 2.2 mg/dL

## 2014-01-30 LAB — URINE CULTURE

## 2014-01-30 LAB — DIGOXIN LEVEL: DIGOXIN: 2.03 ng/mL

## 2014-01-31 LAB — URINALYSIS, COMPLETE
BLOOD: NEGATIVE
Bilirubin,UR: NEGATIVE
GLUCOSE, UR: NEGATIVE mg/dL (ref 0–75)
Ketone: NEGATIVE
LEUKOCYTE ESTERASE: NEGATIVE
NITRITE: NEGATIVE
Ph: 5 (ref 4.5–8.0)
Protein: 100
RBC,UR: 4 /HPF (ref 0–5)
Specific Gravity: 1.016 (ref 1.003–1.030)
Squamous Epithelial: 1

## 2014-01-31 LAB — CBC WITH DIFFERENTIAL/PLATELET
Basophil #: 0 10*3/uL (ref 0.0–0.1)
Basophil %: 0.1 %
Eosinophil #: 0 10*3/uL (ref 0.0–0.7)
Eosinophil %: 0.1 %
HCT: 35.3 % (ref 35.0–47.0)
HGB: 11.3 g/dL — ABNORMAL LOW (ref 12.0–16.0)
LYMPHS PCT: 3.5 %
Lymphocyte #: 0.6 10*3/uL — ABNORMAL LOW (ref 1.0–3.6)
MCH: 29.9 pg (ref 26.0–34.0)
MCHC: 32.1 g/dL (ref 32.0–36.0)
MCV: 93 fL (ref 80–100)
Monocyte #: 1.9 x10 3/mm — ABNORMAL HIGH (ref 0.2–0.9)
Monocyte %: 11.7 %
NEUTROS PCT: 84.6 %
Neutrophil #: 13.6 10*3/uL — ABNORMAL HIGH (ref 1.4–6.5)
PLATELETS: 240 10*3/uL (ref 150–440)
RBC: 3.79 10*6/uL — ABNORMAL LOW (ref 3.80–5.20)
RDW: 16.7 % — AB (ref 11.5–14.5)
WBC: 16.1 10*3/uL — AB (ref 3.6–11.0)

## 2014-01-31 LAB — BASIC METABOLIC PANEL
ANION GAP: 8 (ref 7–16)
BUN: 31 mg/dL — ABNORMAL HIGH (ref 7–18)
CHLORIDE: 107 mmol/L (ref 98–107)
Calcium, Total: 8.9 mg/dL (ref 8.5–10.1)
Co2: 23 mmol/L (ref 21–32)
Creatinine: 1.22 mg/dL (ref 0.60–1.30)
EGFR (African American): 52 — ABNORMAL LOW
EGFR (Non-African Amer.): 45 — ABNORMAL LOW
GLUCOSE: 214 mg/dL — AB (ref 65–99)
Osmolality: 289 (ref 275–301)
Potassium: 4.2 mmol/L (ref 3.5–5.1)
Sodium: 138 mmol/L (ref 136–145)

## 2014-01-31 LAB — PROTEIN / CREATININE RATIO, URINE
CREATININE, URINE: 86.6 mg/dL (ref 30.0–125.0)
PROTEIN, RANDOM URINE: 123 mg/dL — AB (ref 0–12)
Protein/Creat. Ratio: 1420 mg/gCREAT — ABNORMAL HIGH (ref 0–200)

## 2014-02-01 LAB — CBC WITH DIFFERENTIAL/PLATELET
Basophil #: 0.2 10*3/uL — ABNORMAL HIGH (ref 0.0–0.1)
Basophil %: 1.3 %
EOS ABS: 0 10*3/uL (ref 0.0–0.7)
Eosinophil %: 0.1 %
HCT: 35.6 % (ref 35.0–47.0)
HGB: 11.3 g/dL — AB (ref 12.0–16.0)
Lymphocyte #: 0.6 10*3/uL — ABNORMAL LOW (ref 1.0–3.6)
Lymphocyte %: 5.1 %
MCH: 28.9 pg (ref 26.0–34.0)
MCHC: 31.7 g/dL — AB (ref 32.0–36.0)
MCV: 91 fL (ref 80–100)
Monocyte #: 1.5 x10 3/mm — ABNORMAL HIGH (ref 0.2–0.9)
Monocyte %: 12 %
Neutrophil #: 10.2 10*3/uL — ABNORMAL HIGH (ref 1.4–6.5)
Neutrophil %: 81.5 %
PLATELETS: 269 10*3/uL (ref 150–440)
RBC: 3.9 10*6/uL (ref 3.80–5.20)
RDW: 17.1 % — ABNORMAL HIGH (ref 11.5–14.5)
WBC: 12.5 10*3/uL — AB (ref 3.6–11.0)

## 2014-02-01 LAB — BASIC METABOLIC PANEL
Anion Gap: 5 — ABNORMAL LOW (ref 7–16)
BUN: 21 mg/dL — AB (ref 7–18)
Calcium, Total: 9.1 mg/dL (ref 8.5–10.1)
Chloride: 105 mmol/L (ref 98–107)
Co2: 30 mmol/L (ref 21–32)
Creatinine: 0.81 mg/dL (ref 0.60–1.30)
EGFR (Non-African Amer.): >60
Glucose: 169 mg/dL — ABNORMAL HIGH (ref 65–99)
OSMOLALITY: 286 (ref 275–301)
POTASSIUM: 3.6 mmol/L (ref 3.5–5.1)
SODIUM: 140 mmol/L (ref 136–145)

## 2014-02-01 LAB — UR PROT ELECTROPHORESIS, URINE RANDOM

## 2014-02-01 LAB — PROTEIN ELECTROPHORESIS(ARMC)

## 2014-02-02 DIAGNOSIS — I4891 Unspecified atrial fibrillation: Secondary | ICD-10-CM

## 2014-02-02 DIAGNOSIS — T460X5A Adverse effect of cardiac-stimulant glycosides and drugs of similar action, initial encounter: Secondary | ICD-10-CM

## 2014-02-02 DIAGNOSIS — N179 Acute kidney failure, unspecified: Secondary | ICD-10-CM

## 2014-02-02 LAB — CBC WITH DIFFERENTIAL/PLATELET
BASOS PCT: 0.4 %
Basophil #: 0.1 10*3/uL (ref 0.0–0.1)
EOS ABS: 0 10*3/uL (ref 0.0–0.7)
EOS PCT: 0.3 %
HCT: 35.9 % (ref 35.0–47.0)
HGB: 11.3 g/dL — ABNORMAL LOW (ref 12.0–16.0)
LYMPHS ABS: 0.7 10*3/uL — AB (ref 1.0–3.6)
Lymphocyte %: 5.4 %
MCH: 28.8 pg (ref 26.0–34.0)
MCHC: 31.6 g/dL — AB (ref 32.0–36.0)
MCV: 91 fL (ref 80–100)
Monocyte #: 1.4 x10 3/mm — ABNORMAL HIGH (ref 0.2–0.9)
Monocyte %: 10.8 %
NEUTROS ABS: 10.9 10*3/uL — AB (ref 1.4–6.5)
NEUTROS PCT: 83.1 %
Platelet: 280 10*3/uL (ref 150–440)
RBC: 3.93 10*6/uL (ref 3.80–5.20)
RDW: 16.9 % — ABNORMAL HIGH (ref 11.5–14.5)
WBC: 13.1 10*3/uL — AB (ref 3.6–11.0)

## 2014-02-02 LAB — AFP TUMOR MARKER: AFP TUMOR MARKER: 1.6 ng/mL (ref 0.0–8.3)

## 2014-02-03 ENCOUNTER — Ambulatory Visit: Payer: Self-pay | Admitting: Internal Medicine

## 2014-02-03 LAB — CBC WITH DIFFERENTIAL/PLATELET
BASOS ABS: 0.1 10*3/uL (ref 0.0–0.1)
BASOS PCT: 0.5 %
EOS PCT: 0.6 %
Eosinophil #: 0.1 10*3/uL (ref 0.0–0.7)
HCT: 34.3 % — AB (ref 35.0–47.0)
HGB: 11 g/dL — ABNORMAL LOW (ref 12.0–16.0)
LYMPHS PCT: 5 %
Lymphocyte #: 0.7 10*3/uL — ABNORMAL LOW (ref 1.0–3.6)
MCH: 29 pg (ref 26.0–34.0)
MCHC: 32 g/dL (ref 32.0–36.0)
MCV: 91 fL (ref 80–100)
MONO ABS: 1.6 x10 3/mm — AB (ref 0.2–0.9)
Monocyte %: 11.7 %
NEUTROS PCT: 82.2 %
Neutrophil #: 11.5 10*3/uL — ABNORMAL HIGH (ref 1.4–6.5)
PLATELETS: 281 10*3/uL (ref 150–440)
RBC: 3.78 10*6/uL — ABNORMAL LOW (ref 3.80–5.20)
RDW: 16.6 % — AB (ref 11.5–14.5)
WBC: 14 10*3/uL — ABNORMAL HIGH (ref 3.6–11.0)

## 2014-02-03 LAB — CEA: CEA: 12.5 ng/mL — ABNORMAL HIGH (ref 0.0–4.7)

## 2014-02-03 LAB — CANCER ANTIGEN 27.29: CA 27.29: 25 U/mL (ref 0.0–38.6)

## 2014-02-03 LAB — CANCER ANTIGEN 19-9: CA 19-9: 36 U/mL — ABNORMAL HIGH (ref 0–35)

## 2014-02-05 LAB — CBC WITH DIFFERENTIAL/PLATELET
Basophil #: 0 10*3/uL (ref 0.0–0.1)
Basophil %: 0.1 %
Eosinophil #: 0.1 10*3/uL (ref 0.0–0.7)
Eosinophil %: 0.4 %
HCT: 36.5 % (ref 35.0–47.0)
HGB: 11.8 g/dL — ABNORMAL LOW (ref 12.0–16.0)
Lymphocyte #: 0.4 10*3/uL — ABNORMAL LOW (ref 1.0–3.6)
Lymphocyte %: 2.4 %
MCH: 29.3 pg (ref 26.0–34.0)
MCHC: 32.2 g/dL (ref 32.0–36.0)
MCV: 91 fL (ref 80–100)
MONO ABS: 1.7 x10 3/mm — AB (ref 0.2–0.9)
MONOS PCT: 9.7 %
Neutrophil #: 15.2 10*3/uL — ABNORMAL HIGH (ref 1.4–6.5)
Neutrophil %: 87.4 %
Platelet: 296 10*3/uL (ref 150–440)
RBC: 4.02 10*6/uL (ref 3.80–5.20)
RDW: 16.8 % — ABNORMAL HIGH (ref 11.5–14.5)
WBC: 17.4 10*3/uL — AB (ref 3.6–11.0)

## 2014-02-05 LAB — COMPREHENSIVE METABOLIC PANEL
ALBUMIN: 2.3 g/dL — AB (ref 3.4–5.0)
ALK PHOS: 111 U/L
ALT: 30 U/L (ref 12–78)
Anion Gap: 5 — ABNORMAL LOW (ref 7–16)
BUN: 16 mg/dL (ref 7–18)
Bilirubin,Total: 0.5 mg/dL (ref 0.2–1.0)
CO2: 33 mmol/L — AB (ref 21–32)
CREATININE: 0.92 mg/dL (ref 0.60–1.30)
Calcium, Total: 9 mg/dL (ref 8.5–10.1)
Chloride: 98 mmol/L (ref 98–107)
EGFR (African American): >60
EGFR (Non-African Amer.): >60
Glucose: 254 mg/dL — ABNORMAL HIGH (ref 65–99)
Osmolality: 282 (ref 275–301)
POTASSIUM: 3.2 mmol/L — AB (ref 3.5–5.1)
SGOT(AST): 30 U/L (ref 15–37)
SODIUM: 136 mmol/L (ref 136–145)
Total Protein: 7.6 g/dL (ref 6.4–8.2)

## 2014-02-05 LAB — TROPONIN I: Troponin-I: 0.02 ng/mL

## 2014-02-06 LAB — CBC WITH DIFFERENTIAL/PLATELET
Basophil #: 0 10*3/uL (ref 0.0–0.1)
Basophil %: 0.3 %
EOS ABS: 0.1 10*3/uL (ref 0.0–0.7)
Eosinophil %: 0.6 %
HCT: 33.2 % — ABNORMAL LOW (ref 35.0–47.0)
HGB: 10.6 g/dL — ABNORMAL LOW (ref 12.0–16.0)
Lymphocyte #: 0.9 10*3/uL — ABNORMAL LOW (ref 1.0–3.6)
Lymphocyte %: 5.5 %
MCH: 28.8 pg (ref 26.0–34.0)
MCHC: 31.8 g/dL — ABNORMAL LOW (ref 32.0–36.0)
MCV: 91 fL (ref 80–100)
Monocyte #: 2 x10 3/mm — ABNORMAL HIGH (ref 0.2–0.9)
Monocyte %: 12.5 %
NEUTROS PCT: 81.1 %
Neutrophil #: 13.2 10*3/uL — ABNORMAL HIGH (ref 1.4–6.5)
PLATELETS: 307 10*3/uL (ref 150–440)
RBC: 3.66 10*6/uL — AB (ref 3.80–5.20)
RDW: 16.6 % — ABNORMAL HIGH (ref 11.5–14.5)
WBC: 16.3 10*3/uL — AB (ref 3.6–11.0)

## 2014-02-08 ENCOUNTER — Emergency Department: Payer: Self-pay | Admitting: Emergency Medicine

## 2014-02-08 LAB — CBC
HCT: 32.9 % — ABNORMAL LOW (ref 35.0–47.0)
HGB: 10.4 g/dL — AB (ref 12.0–16.0)
MCH: 28.6 pg (ref 26.0–34.0)
MCHC: 31.7 g/dL — ABNORMAL LOW (ref 32.0–36.0)
MCV: 90 fL (ref 80–100)
Platelet: 423 10*3/uL (ref 150–440)
RBC: 3.64 10*6/uL — ABNORMAL LOW (ref 3.80–5.20)
RDW: 16.2 % — ABNORMAL HIGH (ref 11.5–14.5)
WBC: 16.2 10*3/uL — ABNORMAL HIGH (ref 3.6–11.0)

## 2014-02-08 LAB — BASIC METABOLIC PANEL
ANION GAP: 6 — AB (ref 7–16)
BUN: 10 mg/dL (ref 7–18)
CHLORIDE: 96 mmol/L — AB (ref 98–107)
CO2: 33 mmol/L — AB (ref 21–32)
CREATININE: 0.74 mg/dL (ref 0.60–1.30)
Calcium, Total: 9 mg/dL (ref 8.5–10.1)
EGFR (Non-African Amer.): >60
Glucose: 140 mg/dL — ABNORMAL HIGH (ref 65–99)
Osmolality: 271 (ref 275–301)
Potassium: 3 mmol/L — ABNORMAL LOW (ref 3.5–5.1)
Sodium: 135 mmol/L — ABNORMAL LOW (ref 136–145)

## 2014-02-08 LAB — HEPATIC FUNCTION PANEL A (ARMC)
Albumin: 2.1 g/dL — ABNORMAL LOW (ref 3.4–5.0)
Alkaline Phosphatase: 144 U/L — ABNORMAL HIGH
Bilirubin, Direct: 0.1 mg/dL (ref 0.00–0.20)
Bilirubin,Total: 0.4 mg/dL (ref 0.2–1.0)
SGOT(AST): 35 U/L (ref 15–37)
SGPT (ALT): 29 U/L (ref 12–78)
Total Protein: 7.8 g/dL (ref 6.4–8.2)

## 2014-02-08 LAB — TROPONIN I: Troponin-I: <0.02 ng/mL

## 2014-02-10 LAB — CULTURE, BLOOD (SINGLE)

## 2014-03-06 ENCOUNTER — Ambulatory Visit: Payer: Self-pay | Admitting: Internal Medicine

## 2014-06-02 ENCOUNTER — Inpatient Hospital Stay: Payer: Self-pay | Admitting: Internal Medicine

## 2014-06-02 LAB — CBC
HCT: 38.7 % (ref 35.0–47.0)
HGB: 12.2 g/dL (ref 12.0–16.0)
MCH: 27.8 pg (ref 26.0–34.0)
MCHC: 31.5 g/dL — ABNORMAL LOW (ref 32.0–36.0)
MCV: 88 fL (ref 80–100)
Platelet: 256 10*3/uL (ref 150–440)
RBC: 4.39 10*6/uL (ref 3.80–5.20)
RDW: 17.3 % — ABNORMAL HIGH (ref 11.5–14.5)
WBC: 5.4 10*3/uL (ref 3.6–11.0)

## 2014-06-02 LAB — PROTIME-INR
INR: 1.8
PROTHROMBIN TIME: 20.6 s — AB (ref 11.5–14.7)

## 2014-06-02 LAB — MAGNESIUM: Magnesium: 1.4 mg/dL — ABNORMAL LOW

## 2014-06-02 LAB — COMPREHENSIVE METABOLIC PANEL
ALBUMIN: 2.9 g/dL — AB (ref 3.4–5.0)
ALT: 17 U/L
Alkaline Phosphatase: 184 U/L — ABNORMAL HIGH
Anion Gap: 10 (ref 7–16)
BUN: 30 mg/dL — AB (ref 7–18)
Bilirubin,Total: 0.6 mg/dL (ref 0.2–1.0)
CHLORIDE: 104 mmol/L (ref 98–107)
CO2: 20 mmol/L — AB (ref 21–32)
CREATININE: 1.69 mg/dL — AB (ref 0.60–1.30)
Calcium, Total: 8.8 mg/dL (ref 8.5–10.1)
EGFR (African American): 35 — ABNORMAL LOW
EGFR (Non-African Amer.): 30 — ABNORMAL LOW
GLUCOSE: 150 mg/dL — AB (ref 65–99)
OSMOLALITY: 277 (ref 275–301)
Potassium: 5 mmol/L (ref 3.5–5.1)
SGOT(AST): 23 U/L (ref 15–37)
SODIUM: 134 mmol/L — AB (ref 136–145)
TOTAL PROTEIN: 8.6 g/dL — AB (ref 6.4–8.2)

## 2014-06-02 LAB — CK TOTAL AND CKMB (NOT AT ARMC)
CK, TOTAL: 90 U/L
CK, Total: 84 U/L
CK-MB: 1.2 ng/mL (ref 0.5–3.6)
CK-MB: 0.6 ng/mL (ref 0.5–3.6)

## 2014-06-02 LAB — TROPONIN I
Troponin-I: <0.02 ng/mL
Troponin-I: 0.04 ng/mL

## 2014-06-02 LAB — APTT: Activated PTT: 40.9 secs — ABNORMAL HIGH (ref 23.6–35.9)

## 2014-06-02 LAB — PRO B NATRIURETIC PEPTIDE: B-TYPE NATIURETIC PEPTID: 2093 pg/mL — AB (ref 0–125)

## 2014-06-02 LAB — LIPASE, BLOOD: LIPASE: 43 U/L — AB (ref 73–393)

## 2014-06-03 LAB — CBC WITH DIFFERENTIAL/PLATELET
Basophil #: 0.1 10*3/uL (ref 0.0–0.1)
Basophil %: 0.2 %
EOS PCT: 0.1 %
Eosinophil #: 0 10*3/uL (ref 0.0–0.7)
HCT: 33.1 % — ABNORMAL LOW (ref 35.0–47.0)
HGB: 10.5 g/dL — AB (ref 12.0–16.0)
Lymphocyte #: 0.5 10*3/uL — ABNORMAL LOW (ref 1.0–3.6)
Lymphocyte %: 1.6 %
MCH: 28.3 pg (ref 26.0–34.0)
MCHC: 31.9 g/dL — ABNORMAL LOW (ref 32.0–36.0)
MCV: 89 fL (ref 80–100)
MONOS PCT: 3 %
Monocyte #: 0.9 x10 3/mm (ref 0.2–0.9)
NEUTROS PCT: 95.1 %
Neutrophil #: 28.1 10*3/uL — ABNORMAL HIGH (ref 1.4–6.5)
Platelet: 199 10*3/uL (ref 150–440)
RBC: 3.73 10*6/uL — ABNORMAL LOW (ref 3.80–5.20)
RDW: 17.2 % — ABNORMAL HIGH (ref 11.5–14.5)
WBC: 29.5 10*3/uL — ABNORMAL HIGH (ref 3.6–11.0)

## 2014-06-03 LAB — URINALYSIS, COMPLETE
Bilirubin,UR: NEGATIVE
Glucose,UR: NEGATIVE mg/dL (ref 0–75)
Ketone: NEGATIVE
NITRITE: NEGATIVE
Ph: 5 (ref 4.5–8.0)
RBC,UR: 19 /HPF (ref 0–5)
SPECIFIC GRAVITY: 1.017 (ref 1.003–1.030)
Squamous Epithelial: 4

## 2014-06-03 LAB — CK TOTAL AND CKMB (NOT AT ARMC)
CK, Total: 290 U/L — ABNORMAL HIGH
CK-MB: 1.3 ng/mL (ref 0.5–3.6)

## 2014-06-03 LAB — COMPREHENSIVE METABOLIC PANEL
ALK PHOS: 129 U/L — AB
ALT: 26 U/L
ANION GAP: 9 (ref 7–16)
Albumin: 2.3 g/dL — ABNORMAL LOW (ref 3.4–5.0)
BUN: 36 mg/dL — ABNORMAL HIGH (ref 7–18)
Bilirubin,Total: 0.6 mg/dL (ref 0.2–1.0)
CHLORIDE: 106 mmol/L (ref 98–107)
Calcium, Total: 8.2 mg/dL — ABNORMAL LOW (ref 8.5–10.1)
Co2: 18 mmol/L — ABNORMAL LOW (ref 21–32)
Creatinine: 2.12 mg/dL — ABNORMAL HIGH (ref 0.60–1.30)
EGFR (African American): 26 — ABNORMAL LOW
EGFR (Non-African Amer.): 23 — ABNORMAL LOW
GLUCOSE: 237 mg/dL — AB (ref 65–99)
Osmolality: 282 (ref 275–301)
POTASSIUM: 4.6 mmol/L (ref 3.5–5.1)
SGOT(AST): 47 U/L — ABNORMAL HIGH (ref 15–37)
Sodium: 133 mmol/L — ABNORMAL LOW (ref 136–145)
Total Protein: 7.3 g/dL (ref 6.4–8.2)

## 2014-06-03 LAB — TROPONIN I: TROPONIN-I: 0.03 ng/mL

## 2014-06-03 LAB — MAGNESIUM: Magnesium: 1.9 mg/dL

## 2014-06-03 LAB — PHOSPHORUS: PHOSPHORUS: 3.9 mg/dL (ref 2.5–4.9)

## 2014-06-04 LAB — BASIC METABOLIC PANEL
ANION GAP: 11 (ref 7–16)
BUN: 38 mg/dL — ABNORMAL HIGH (ref 7–18)
Calcium, Total: 7.9 mg/dL — ABNORMAL LOW (ref 8.5–10.1)
Chloride: 109 mmol/L — ABNORMAL HIGH (ref 98–107)
Co2: 20 mmol/L — ABNORMAL LOW (ref 21–32)
Creatinine: 1.74 mg/dL — ABNORMAL HIGH (ref 0.60–1.30)
EGFR (African American): 34 — ABNORMAL LOW
EGFR (Non-African Amer.): 29 — ABNORMAL LOW
Glucose: 186 mg/dL — ABNORMAL HIGH (ref 65–99)
Osmolality: 293 (ref 275–301)
POTASSIUM: 4.1 mmol/L (ref 3.5–5.1)
Sodium: 140 mmol/L (ref 136–145)

## 2014-06-04 LAB — CBC WITH DIFFERENTIAL/PLATELET
BASOS PCT: 0.4 %
Basophil #: 0.1 10*3/uL (ref 0.0–0.1)
Eosinophil #: 0 10*3/uL (ref 0.0–0.7)
Eosinophil %: 0 %
HCT: 31.2 % — ABNORMAL LOW (ref 35.0–47.0)
HGB: 9.7 g/dL — AB (ref 12.0–16.0)
LYMPHS PCT: 1.7 %
Lymphocyte #: 0.4 10*3/uL — ABNORMAL LOW (ref 1.0–3.6)
MCH: 27.5 pg (ref 26.0–34.0)
MCHC: 31 g/dL — ABNORMAL LOW (ref 32.0–36.0)
MCV: 89 fL (ref 80–100)
MONO ABS: 0.7 x10 3/mm (ref 0.2–0.9)
Monocyte %: 3.1 %
Neutrophil #: 21.7 10*3/uL — ABNORMAL HIGH (ref 1.4–6.5)
Neutrophil %: 94.8 %
Platelet: 169 10*3/uL (ref 150–440)
RBC: 3.52 10*6/uL — AB (ref 3.80–5.20)
RDW: 17.5 % — ABNORMAL HIGH (ref 11.5–14.5)
WBC: 22.9 10*3/uL — AB (ref 3.6–11.0)

## 2014-06-04 LAB — PHOSPHORUS: Phosphorus: 4.1 mg/dL (ref 2.5–4.9)

## 2014-06-04 LAB — TSH: Thyroid Stimulating Horm: 0.52 u[IU]/mL

## 2014-06-05 LAB — BASIC METABOLIC PANEL
Anion Gap: 8 (ref 7–16)
BUN: 41 mg/dL — ABNORMAL HIGH (ref 7–18)
CHLORIDE: 112 mmol/L — AB (ref 98–107)
CREATININE: 1.43 mg/dL — AB (ref 0.60–1.30)
Calcium, Total: 8.5 mg/dL (ref 8.5–10.1)
Co2: 21 mmol/L (ref 21–32)
EGFR (Non-African Amer.): 37 — ABNORMAL LOW
GFR CALC AF AMER: 43 — AB
Glucose: 260 mg/dL — ABNORMAL HIGH (ref 65–99)
Osmolality: 300 (ref 275–301)
POTASSIUM: 3.9 mmol/L (ref 3.5–5.1)
Sodium: 141 mmol/L (ref 136–145)

## 2014-06-05 LAB — CBC WITH DIFFERENTIAL/PLATELET
Basophil #: 0 10*3/uL (ref 0.0–0.1)
Basophil %: 0.1 %
Eosinophil #: 0 10*3/uL (ref 0.0–0.7)
Eosinophil %: 0 %
HCT: 32.2 % — ABNORMAL LOW (ref 35.0–47.0)
HGB: 10 g/dL — ABNORMAL LOW (ref 12.0–16.0)
Lymphocyte #: 0.5 10*3/uL — ABNORMAL LOW (ref 1.0–3.6)
Lymphocyte %: 3.4 %
MCH: 27.5 pg (ref 26.0–34.0)
MCHC: 31.1 g/dL — ABNORMAL LOW (ref 32.0–36.0)
MCV: 88 fL (ref 80–100)
Monocyte #: 0.9 x10 3/mm (ref 0.2–0.9)
Monocyte %: 6.5 %
Neutrophil #: 12.3 10*3/uL — ABNORMAL HIGH (ref 1.4–6.5)
Neutrophil %: 90 %
Platelet: 160 10*3/uL (ref 150–440)
RBC: 3.64 10*6/uL — ABNORMAL LOW (ref 3.80–5.20)
RDW: 17.6 % — ABNORMAL HIGH (ref 11.5–14.5)
WBC: 13.7 10*3/uL — ABNORMAL HIGH (ref 3.6–11.0)

## 2014-06-05 LAB — URINALYSIS, COMPLETE
Bacteria: NONE SEEN
Bilirubin,UR: NEGATIVE
Blood: NEGATIVE
Glucose,UR: NEGATIVE mg/dL (ref 0–75)
Ketone: NEGATIVE
Leukocyte Esterase: NEGATIVE
Nitrite: NEGATIVE
Ph: 6 (ref 4.5–8.0)
Protein: NEGATIVE
RBC,UR: 4 /HPF (ref 0–5)
Specific Gravity: 1.011 (ref 1.003–1.030)
Squamous Epithelial: 2
WBC UR: 1 /HPF (ref 0–5)

## 2014-06-05 LAB — URINE CULTURE

## 2014-06-06 LAB — CBC WITH DIFFERENTIAL/PLATELET
BASOS ABS: 0 10*3/uL (ref 0.0–0.1)
BASOS PCT: 0.1 %
EOS PCT: 0 %
Eosinophil #: 0 10*3/uL (ref 0.0–0.7)
HCT: 32.7 % — AB (ref 35.0–47.0)
HGB: 10.7 g/dL — AB (ref 12.0–16.0)
Lymphocyte #: 0.8 10*3/uL — ABNORMAL LOW (ref 1.0–3.6)
Lymphocyte %: 5 %
MCH: 28.5 pg (ref 26.0–34.0)
MCHC: 32.7 g/dL (ref 32.0–36.0)
MCV: 87 fL (ref 80–100)
MONO ABS: 1.4 x10 3/mm — AB (ref 0.2–0.9)
MONOS PCT: 9 %
NEUTROS ABS: 13.1 10*3/uL — AB (ref 1.4–6.5)
Neutrophil %: 85.9 %
PLATELETS: 182 10*3/uL (ref 150–440)
RBC: 3.75 10*6/uL — AB (ref 3.80–5.20)
RDW: 17.2 % — ABNORMAL HIGH (ref 11.5–14.5)
WBC: 15.2 10*3/uL — ABNORMAL HIGH (ref 3.6–11.0)

## 2014-06-06 LAB — BASIC METABOLIC PANEL
Anion Gap: 7 (ref 7–16)
BUN: 33 mg/dL — AB (ref 7–18)
CALCIUM: 8.6 mg/dL (ref 8.5–10.1)
CHLORIDE: 110 mmol/L — AB (ref 98–107)
CO2: 25 mmol/L (ref 21–32)
Creatinine: 1.17 mg/dL (ref 0.60–1.30)
EGFR (Non-African Amer.): 47 — ABNORMAL LOW
GFR CALC AF AMER: 54 — AB
GLUCOSE: 184 mg/dL — AB (ref 65–99)
OSMOLALITY: 295 (ref 275–301)
Potassium: 3.9 mmol/L (ref 3.5–5.1)
Sodium: 142 mmol/L (ref 136–145)

## 2014-06-06 LAB — URINE CULTURE

## 2014-06-07 LAB — CBC WITH DIFFERENTIAL/PLATELET
BASOS PCT: 0.4 %
Basophil #: 0.1 10*3/uL (ref 0.0–0.1)
EOS PCT: 0.1 %
Eosinophil #: 0 10*3/uL (ref 0.0–0.7)
HCT: 36.4 % (ref 35.0–47.0)
HGB: 11.2 g/dL — AB (ref 12.0–16.0)
LYMPHS PCT: 7.9 %
Lymphocyte #: 1.3 10*3/uL (ref 1.0–3.6)
MCH: 27.1 pg (ref 26.0–34.0)
MCHC: 30.9 g/dL — AB (ref 32.0–36.0)
MCV: 88 fL (ref 80–100)
Monocyte #: 1.6 x10 3/mm — ABNORMAL HIGH (ref 0.2–0.9)
Monocyte %: 9.8 %
NEUTROS PCT: 81.8 %
Neutrophil #: 13.1 10*3/uL — ABNORMAL HIGH (ref 1.4–6.5)
Platelet: 229 10*3/uL (ref 150–440)
RBC: 4.14 10*6/uL (ref 3.80–5.20)
RDW: 17.1 % — AB (ref 11.5–14.5)
WBC: 16 10*3/uL — AB (ref 3.6–11.0)

## 2014-06-07 LAB — CULTURE, BLOOD (SINGLE)

## 2014-06-07 LAB — URINE CULTURE

## 2014-06-08 LAB — CBC WITH DIFFERENTIAL/PLATELET
Bands: 2 %
Eosinophil: 2 %
HCT: 38.4 % (ref 35.0–47.0)
HGB: 12.1 g/dL (ref 12.0–16.0)
Lymphocytes: 7 %
MCH: 27.5 pg (ref 26.0–34.0)
MCHC: 31.5 g/dL — AB (ref 32.0–36.0)
MCV: 87 fL (ref 80–100)
Metamyelocyte: 3 %
Monocytes: 8 %
Myelocyte: 1 %
PLATELETS: 276 10*3/uL (ref 150–440)
RBC: 4.4 10*6/uL (ref 3.80–5.20)
RDW: 17.2 % — ABNORMAL HIGH (ref 11.5–14.5)
SEGMENTED NEUTROPHILS: 77 %
WBC: 16.8 10*3/uL — ABNORMAL HIGH (ref 3.6–11.0)

## 2014-06-08 LAB — BASIC METABOLIC PANEL
Anion Gap: 6 — ABNORMAL LOW (ref 7–16)
BUN: 33 mg/dL — ABNORMAL HIGH (ref 7–18)
CALCIUM: 8.9 mg/dL (ref 8.5–10.1)
Chloride: 101 mmol/L (ref 98–107)
Co2: 31 mmol/L (ref 21–32)
Creatinine: 1.13 mg/dL (ref 0.60–1.30)
EGFR (African American): 57 — ABNORMAL LOW
EGFR (Non-African Amer.): 49 — ABNORMAL LOW
GLUCOSE: 132 mg/dL — AB (ref 65–99)
OSMOLALITY: 285 (ref 275–301)
POTASSIUM: 3.7 mmol/L (ref 3.5–5.1)
Sodium: 138 mmol/L (ref 136–145)

## 2014-06-08 LAB — MAGNESIUM: Magnesium: 1.5 mg/dL — ABNORMAL LOW

## 2014-10-10 ENCOUNTER — Encounter: Payer: Self-pay | Admitting: Cardiovascular Disease

## 2014-10-10 ENCOUNTER — Ambulatory Visit (INDEPENDENT_AMBULATORY_CARE_PROVIDER_SITE_OTHER): Payer: 59 | Admitting: Cardiovascular Disease

## 2014-10-10 VITALS — BP 116/64 | HR 67 | Ht 66.0 in | Wt 250.0 lb

## 2014-10-10 DIAGNOSIS — I2510 Atherosclerotic heart disease of native coronary artery without angina pectoris: Secondary | ICD-10-CM

## 2014-10-10 DIAGNOSIS — I4892 Unspecified atrial flutter: Secondary | ICD-10-CM

## 2014-10-10 DIAGNOSIS — R609 Edema, unspecified: Secondary | ICD-10-CM

## 2014-10-10 DIAGNOSIS — I5032 Chronic diastolic (congestive) heart failure: Secondary | ICD-10-CM

## 2014-10-10 DIAGNOSIS — E785 Hyperlipidemia, unspecified: Secondary | ICD-10-CM

## 2014-10-10 DIAGNOSIS — R0602 Shortness of breath: Secondary | ICD-10-CM

## 2014-10-10 NOTE — Assessment & Plan Note (Signed)
Currently with no symptoms of angina. No further workup at this time. Continue current medication regimen. 

## 2014-10-10 NOTE — Assessment & Plan Note (Signed)
She reports weight gain on Lasix 20 g twice a day. We have suggested she increase Lasix up to 40 mg twice a day for weight more than 245 pounds. Take with potassium 20 once in the morning, 10 mill equivalents at night.  For weight less than 245 pounds, decrease the Lasix back to 20 mg twice a day with 20 mEq of potassium in the morning

## 2014-10-10 NOTE — Assessment & Plan Note (Signed)
Suggested she stay on her simvastatin 

## 2014-10-10 NOTE — Assessment & Plan Note (Signed)
Edema is worse recently, higher weight, all likely secondary to acute on chronic diastolic CHF.

## 2014-10-10 NOTE — Progress Notes (Signed)
Patient ID: [name], female    DOB: 11-23-1940, 74 y.o.   MRN: [id number]  HPI Comments: 74 year old female with CAD, neuropathy in the feet, recent episodes of mental status changes and unresponsiveness secondary to polypharmacy, seen at [facility] with cardiac catheterization performed for elevated cardiac enzymes showing a totally occluded RCA that was chronic, mid circumflex with 95% stenosis just before OM-1, 50% mid LAD,  EF was 55%. PCI performed on August 16 with DES stent to the left circumflex, additional episodes of unresponsiveness from her medications, one of the episodes requiring intubation for airway protection, also with episodes of atrial flutter, who presents to the clinic for routine followup of her chronic diastolic CHF . Previous admissions to the hospital for mental status changes felt to be secondary to polypharmacy. Also has a history of mild CO2 retention, obstructive sleep apnea. She continues to smoke 6 or 7 cigarettes per day  In follow-up today, she reports that she has had worsening abdominal bloating and leg swelling for the past 2 months. Lasix was decreased down to 20 minute grams twice a day. Since then, weight has climbed up approximately 10 pounds.   one month ago she was 240 pounds, now 250 pounds. She feels her ideal weight is 245 pounds or less She does report having a low potassium on Lasix 40 mg twice a day in the past, only mildly low by her account Recent cough and congestion. She reports she'll be started on antibiotics at [facility] by Dr. Elease Hashimoto for possible bronchitis She does have a high fluid intake on a daily basis  EKG on today's visit shows normal sinus rhythm with rate 67 bpm, no significant ST or T-wave changes  Other past medical history  Admission to the hospital April 2014 with shortness of breath, atrial fibrillation, acute diastolic CHF, urinary tract infection. She did convert to normal sinus rhythm in the hospital after given  Cardizem IV She lives at [facility].  does not walk secondary to lower extremity neuropathy and weakness. significant restless leg symptoms during the daytime.   Previous cardioversion for atrial flutter, restoring normal sinus rhythm            Allergies  Allergen Reactions  . Amiodarone     Intolerance   . Latex     Outpatient Encounter Prescriptions as of 10/10/2014  Medication Sig  . acetaminophen (TYLENOL) 325 MG tablet Take 650 mg by mouth every 6 (six) hours as needed.  Marland Kitchen albuterol (PROVENTIL) (2.5 MG/3ML) 0.083% nebulizer solution Take 2.5 mg by nebulization every 4 (four) hours as needed for wheezing or shortness of breath.  Marland Kitchen amiodarone (PACERONE) 200 MG tablet Take 200 mg by mouth 2 (two) times daily.  Marland Kitchen aspirin 81 MG tablet Take 81 mg by mouth daily.  Marland Kitchen buPROPion (WELLBUTRIN) 75 MG tablet Take 75 mg by mouth daily.  . Cholecalciferol (VITAMIN D3) 50000 UNITS CAPS Take by mouth once a week.  . collagenase (SANTYL) ointment Apply 1 application topically daily.  Marland Kitchen docusate sodium (COLACE) 100 MG capsule Take 100 mg by mouth every 12 (twelve) hours.  . DULoxetine (CYMBALTA) 60 MG capsule Take 30 mg by mouth 2 (two) times daily.   . Fluticasone-Salmeterol (ADVAIR DISKUS IN) Inhale 1 puff into the lungs 2 (two) times daily.  . furosemide (LASIX) 20 MG tablet Take 20 mg by mouth 2 (two) times daily.   Marland Kitchen gabapentin (NEURONTIN) 100 MG capsule Take 100 mg by mouth 2 (two) times daily.   Marland Kitchen  guaiFENesin (MUCINEX) 600 MG 12 hr tablet Take 1,200 mg by mouth every 12 (twelve) hours.  . hydrocerin (EUCERIN) CREA Apply 1 application topically 2 (two) times daily.  . Hypromellose (ARTIFICIAL TEARS) 0.4 % SOLN Apply to eye.  . levothyroxine (SYNTHROID, LEVOTHROID) 200 MCG tablet Take 200 mcg by mouth daily before breakfast.  . loperamide (IMODIUM A-D) 2 MG tablet Take 2 mg by mouth 4 (four) times daily as needed for diarrhea or loose stools.  . Melatonin 1 MG TABS Take by mouth at  bedtime.  . metFORMIN (GLUCOPHAGE) 500 MG tablet Take 250 mg by mouth daily with breakfast.   . metoprolol tartrate (LOPRESSOR) 25 MG tablet Take 25 mg by mouth 2 (two) times daily.   . montelukast (SINGULAIR) 10 MG tablet Take 10 mg by mouth at bedtime.  . Multiple Vitamin (MULTIVITAMIN) tablet Take 1 tablet by mouth daily.    Marland Kitchen. omeprazole (PRILOSEC) 20 MG capsule Take 20 mg by mouth daily.  . ondansetron (ZOFRAN) 4 MG tablet Take 4 mg by mouth every 6 (six) hours as needed for nausea or vomiting.  Marland Kitchen. oxyCODONE-acetaminophen (PERCOCET) 5-325 MG per tablet Take 1 tablet by mouth every 4 (four) hours as needed.    . potassium chloride (K-DUR,KLOR-CON) 10 MEQ tablet Take 20 mEq by mouth daily.   . Probiotic Product (ALIGN) 4 MG CAPS Take 4 mg by mouth at bedtime.  . Rivaroxaban (XARELTO) 20 MG TABS tablet Take 20 mg by mouth daily with supper.  Marland Kitchen. rOPINIRole (REQUIP) 1 MG tablet Take 1 mg by mouth daily.   Marland Kitchen. rOPINIRole (REQUIP) 2 MG tablet Take 2 mg by mouth at bedtime.  . simvastatin (ZOCOR) 10 MG tablet Take 10 mg by mouth daily.  . tamsulosin (FLOMAX) 0.4 MG CAPS capsule Take 0.4 mg by mouth daily.  Marland Kitchen. tiotropium (SPIRIVA) 18 MCG inhalation capsule Place 18 mcg into inhaler and inhale daily.  . traZODone (DESYREL) 100 MG tablet Take 100 mg by mouth at bedtime.    Past Medical History  Diagnosis Date  . Thyroid disease   . Depression   . Hypertension   . Lumbar disc disease   . Cervical disc disease   . Dyslipidemia   . History of ETOH abuse     dependence with withdrawl seizures  . MI, acute, non ST segment elevation   . Sleep apnea   . Neuropathy   . Anemia   . GERD (gastroesophageal reflux disease)   . Diabetes mellitus without complication   . Restless leg syndrome   . Unspecified psychosis   . CHF (congestive heart failure)   . COPD (chronic obstructive pulmonary disease)   . Atrial fibrillation   . Acute respiratory failure   . Hyperlipidemia     Past Surgical History   Procedure Laterality Date  . Cervical disc surgery  03/2011    [facility]  . Thyroidectomy    . Tonsillectomy    . Replacement total knee      right knee  . Cardiac catheterization  05/2011    [facility] by Dr Mariah MillingGollan  . Coronary angioplasty  05/2011    stent  . Cardioversion    . Colonoscopy      Social History  reports that she has been smoking Cigarettes.  She has a 40.5 pack-year smoking history. She has never used smokeless tobacco. She reports that she does not drink alcohol or use illicit drugs.  Family History family history includes Cancer in her father; Hepatitis in her  brother; Stroke in her mother.   Review of Systems  Constitutional: Negative.   HENT: Negative.   Eyes: Negative.   Respiratory: Positive for shortness of breath.   Cardiovascular: Positive for leg swelling.       Abdominal bloating  Gastrointestinal: Negative.   Endocrine: Negative.   Musculoskeletal: Positive for back pain, arthralgias and gait problem.  Skin: Negative.   Allergic/Immunologic: Negative.   Neurological: Negative.   Hematological: Negative.   Psychiatric/Behavioral: Negative.   All other systems reviewed and are negative.  BP 116/64 mmHg  Pulse 67  Ht  (1.676 m)  Wt 250 lb (113.399 kg)  BMI 40.37 kg/m2  Physical Exam  Constitutional: She is oriented to person, place, and time. She appears well-developed and well-nourished.  HENT:  Head: Normocephalic.  Nose: Nose normal.  Mouth/Throat: Oropharynx is clear and moist.  Eyes: Conjunctivae are normal. Pupils are equal, round, and reactive to light.  Neck: Normal range of motion. Neck supple. No JVD present.  Cardiovascular: Normal rate, regular rhythm, S1 normal, S2 normal, normal heart sounds and intact distal pulses.  Exam reveals no gallop and no friction rub.   No murmur heard. Trace pitting edema to below the knees bilaterally  Pulmonary/Chest: Effort normal and breath sounds normal. No respiratory distress. She has no  wheezes. She has no rales. She exhibits no tenderness.  Abdominal: Soft. Bowel sounds are normal. She exhibits no distension. There is no tenderness.  Musculoskeletal: Normal range of motion. She exhibits edema. She exhibits no tenderness.  Lymphadenopathy:    She has no cervical adenopathy.  Neurological: She is alert and oriented to person, place, and time. Coordination normal.  Skin: Skin is warm and dry. No rash noted. No erythema.  Psychiatric: She has a normal mood and affect. Her behavior is normal. Judgment and thought content normal.    Assessment and Plan  Nursing note and vitals reviewed.

## 2014-10-10 NOTE — Assessment & Plan Note (Signed)
Maintaining normal sinus rhythm. No changes to her medications 

## 2014-10-10 NOTE — Patient Instructions (Signed)
Your weight is higher, baseline weight 245 pounds or less Please increase the lasix up to 40 mg twice a day for weight >245 Take with potassium 20 meq in the AM and 10 meq in the PM  For weight less than 245 pounds, take lasix 20 mg twice a day With potassium 20 meq in the AM  Please call us if you have new issues that need to be addressed before your next appt.  Your physician wants you to follow-up in: 6 months.  You will receive a reminder letter in the mail two months in advance. If you don't receive a letter, please call our office to schedule the follow-up appointment.

## 2015-01-09 ENCOUNTER — Inpatient Hospital Stay
Admit: 2015-01-09 | Disposition: A | Discharge status: Skilled Nursing Facility | Payer: Self-pay | Attending: Internal Medicine | Admitting: Internal Medicine

## 2015-01-09 LAB — CBC WITH DIFFERENTIAL/PLATELET
BASOS PCT: 0.2 %
Basophil #: 0 10*3/uL (ref 0.0–0.1)
EOS ABS: 0.1 10*3/uL (ref 0.0–0.7)
EOS PCT: 0.5 %
HCT: 43 % (ref 35.0–47.0)
HGB: 13.3 g/dL (ref 12.0–16.0)
LYMPHS ABS: 0.8 10*3/uL — AB (ref 1.0–3.6)
Lymphocyte %: 5.6 %
MCH: 27.5 pg (ref 26.0–34.0)
MCHC: 30.9 g/dL — ABNORMAL LOW (ref 32.0–36.0)
MCV: 89 fL (ref 80–100)
Monocyte #: 0.9 x10 3/mm (ref 0.2–0.9)
Monocyte %: 6.4 %
NEUTROS PCT: 87.3 %
Neutrophil #: 11.8 10*3/uL — ABNORMAL HIGH (ref 1.4–6.5)
Platelet: 278 10*3/uL (ref 150–440)
RBC: 4.85 10*6/uL (ref 3.80–5.20)
RDW: 15.8 % — ABNORMAL HIGH (ref 11.5–14.5)
WBC: 13.5 10*3/uL — ABNORMAL HIGH (ref 3.6–11.0)

## 2015-01-09 LAB — URINALYSIS, COMPLETE
Bilirubin,UR: NEGATIVE
GLUCOSE, UR: NEGATIVE mg/dL (ref 0–75)
Ketone: NEGATIVE
Nitrite: NEGATIVE
PH: 7 (ref 4.5–8.0)
Protein: 100
RBC,UR: 250 /HPF (ref 0–5)
SPECIFIC GRAVITY: 1.011 (ref 1.003–1.030)
Squamous Epithelial: 70
WBC UR: 9143 /HPF (ref 0–5)

## 2015-01-09 LAB — BASIC METABOLIC PANEL
ANION GAP: 11 (ref 7–16)
BUN: 37 mg/dL — ABNORMAL HIGH
CREATININE: 1.64 mg/dL — AB
Calcium, Total: 9 mg/dL
Chloride: 96 mmol/L — ABNORMAL LOW
Co2: 30 mmol/L
EGFR (African American): 36 — ABNORMAL LOW
GFR CALC NON AF AMER: 31 — AB
Glucose: 140 mg/dL — ABNORMAL HIGH
POTASSIUM: 4.4 mmol/L
Sodium: 137 mmol/L

## 2015-01-09 LAB — PHOSPHORUS: Phosphorus: 7.8 mg/dL — ABNORMAL HIGH

## 2015-01-09 LAB — COMPREHENSIVE METABOLIC PANEL
ALT: 14 U/L
ANION GAP: 7 (ref 7–16)
AST: 16 U/L
Albumin: 3.8 g/dL
Alkaline Phosphatase: 111 U/L
BUN: 34 mg/dL — ABNORMAL HIGH
Bilirubin,Total: 0.2 mg/dL — ABNORMAL LOW
CALCIUM: 8.6 mg/dL — AB
CREATININE: 1.8 mg/dL — AB
Chloride: 94 mmol/L — ABNORMAL LOW
Co2: 33 mmol/L — ABNORMAL HIGH
EGFR (African American): 32 — ABNORMAL LOW
EGFR (Non-African Amer.): 28 — ABNORMAL LOW
GLUCOSE: 277 mg/dL — AB
Potassium: 6.1 mmol/L — ABNORMAL HIGH
SODIUM: 134 mmol/L — AB
Total Protein: 8.7 g/dL — ABNORMAL HIGH

## 2015-01-09 LAB — MAGNESIUM: Magnesium: 2 mg/dL

## 2015-01-09 LAB — PROTIME-INR
INR: 1.8
Prothrombin Time: 20.9 secs — ABNORMAL HIGH

## 2015-01-09 LAB — TROPONIN I: Troponin-I: <0.03 ng/mL

## 2015-01-09 LAB — LACTIC ACID, PLASMA: LACTIC ACID, VENOUS: 1.6 mmol/L

## 2015-01-10 LAB — BASIC METABOLIC PANEL
Anion Gap: 12 (ref 7–16)
BUN: 41 mg/dL — ABNORMAL HIGH
CHLORIDE: 93 mmol/L — AB
CREATININE: 1.78 mg/dL — AB
Calcium, Total: 8.7 mg/dL — ABNORMAL LOW
Co2: 29 mmol/L
EGFR (African American): 32 — ABNORMAL LOW
EGFR (Non-African Amer.): 28 — ABNORMAL LOW
GLUCOSE: 168 mg/dL — AB
POTASSIUM: 3.8 mmol/L
Sodium: 134 mmol/L — ABNORMAL LOW

## 2015-01-10 LAB — CBC WITH DIFFERENTIAL/PLATELET
BASOS PCT: 0.2 %
Basophil #: 0 10*3/uL (ref 0.0–0.1)
Eosinophil #: 0 10*3/uL (ref 0.0–0.7)
Eosinophil %: 0 %
HCT: 36.3 % (ref 35.0–47.0)
HGB: 11.7 g/dL — AB (ref 12.0–16.0)
LYMPHS ABS: 0.6 10*3/uL — AB (ref 1.0–3.6)
Lymphocyte %: 4.7 %
MCH: 28 pg (ref 26.0–34.0)
MCHC: 32.3 g/dL (ref 32.0–36.0)
MCV: 87 fL (ref 80–100)
MONO ABS: 1 x10 3/mm — AB (ref 0.2–0.9)
MONOS PCT: 8.4 %
NEUTROS ABS: 10.7 10*3/uL — AB (ref 1.4–6.5)
Neutrophil %: 86.7 %
PLATELETS: 289 10*3/uL (ref 150–440)
RBC: 4.19 10*6/uL (ref 3.80–5.20)
RDW: 15 % — ABNORMAL HIGH (ref 11.5–14.5)
WBC: 12.4 10*3/uL — AB (ref 3.6–11.0)

## 2015-01-10 LAB — TSH: THYROID STIMULATING HORM: 0.187 u[IU]/mL — AB

## 2015-01-10 LAB — MAGNESIUM: Magnesium: 1.7 mg/dL

## 2015-01-10 LAB — LIPID PANEL
CHOLESTEROL: 132 mg/dL
HDL: 25 mg/dL — AB
LDL CHOLESTEROL, CALC: 61 mg/dL
TRIGLYCERIDES: 232 mg/dL — AB
VLDL CHOLESTEROL, CALC: 46 mg/dL — AB

## 2015-01-10 LAB — T4, FREE: Free Thyroxine: 1.53 ng/dL — ABNORMAL HIGH

## 2015-01-10 LAB — PHOSPHORUS: PHOSPHORUS: 3.6 mg/dL

## 2015-01-10 LAB — HEMOGLOBIN A1C: Hemoglobin A1C: 7.2 % — ABNORMAL HIGH

## 2015-01-11 LAB — CBC WITH DIFFERENTIAL/PLATELET
BANDS NEUTROPHIL: 3 %
HCT: 37.1 % (ref 35.0–47.0)
HGB: 12 g/dL (ref 12.0–16.0)
Lymphocytes: 8 %
MCH: 27.8 pg (ref 26.0–34.0)
MCHC: 32.3 g/dL (ref 32.0–36.0)
MCV: 86 fL (ref 80–100)
Metamyelocyte: 1 %
Monocytes: 8 %
Platelet: 296 10*3/uL (ref 150–440)
RBC: 4.31 10*6/uL (ref 3.80–5.20)
RDW: 14.9 % — ABNORMAL HIGH (ref 11.5–14.5)
SEGMENTED NEUTROPHILS: 80 %
WBC: 18.1 10*3/uL — AB (ref 3.6–11.0)

## 2015-01-11 LAB — BASIC METABOLIC PANEL
Anion Gap: 11 (ref 7–16)
BUN: 50 mg/dL — ABNORMAL HIGH
CALCIUM: 8.6 mg/dL — AB
Chloride: 93 mmol/L — ABNORMAL LOW
Co2: 32 mmol/L
Creatinine: 1.22 mg/dL — ABNORMAL HIGH
EGFR (African American): 51 — ABNORMAL LOW
EGFR (Non-African Amer.): 44 — ABNORMAL LOW
Glucose: 106 mg/dL — ABNORMAL HIGH
POTASSIUM: 3.5 mmol/L
SODIUM: 136 mmol/L

## 2015-01-11 LAB — URINE CULTURE

## 2015-01-12 LAB — BASIC METABOLIC PANEL
Anion Gap: 7 (ref 7–16)
BUN: 39 mg/dL — AB
CHLORIDE: 96 mmol/L — AB
CREATININE: 1.07 mg/dL — AB
Calcium, Total: 8.4 mg/dL — ABNORMAL LOW
Co2: 34 mmol/L — ABNORMAL HIGH
EGFR (African American): >60
GFR CALC NON AF AMER: 52 — AB
GLUCOSE: 128 mg/dL — AB
POTASSIUM: 3.1 mmol/L — AB
Sodium: 137 mmol/L

## 2015-01-12 LAB — CBC WITH DIFFERENTIAL/PLATELET
BASOS ABS: 0 10*3/uL (ref 0.0–0.1)
Basophil %: 0.3 %
EOS PCT: 2.2 %
Eosinophil #: 0.3 10*3/uL (ref 0.0–0.7)
HCT: 36.5 % (ref 35.0–47.0)
HGB: 11.5 g/dL — ABNORMAL LOW (ref 12.0–16.0)
LYMPHS PCT: 11.7 %
Lymphocyte #: 1.6 10*3/uL (ref 1.0–3.6)
MCH: 27.1 pg (ref 26.0–34.0)
MCHC: 31.5 g/dL — ABNORMAL LOW (ref 32.0–36.0)
MCV: 86 fL (ref 80–100)
MONOS PCT: 14.9 %
Monocyte #: 2 x10 3/mm — ABNORMAL HIGH (ref 0.2–0.9)
NEUTROS PCT: 70.9 %
Neutrophil #: 9.6 10*3/uL — ABNORMAL HIGH (ref 1.4–6.5)
PLATELETS: 279 10*3/uL (ref 150–440)
RBC: 4.24 10*6/uL (ref 3.80–5.20)
RDW: 15.4 % — AB (ref 11.5–14.5)
WBC: 13.5 10*3/uL — AB (ref 3.6–11.0)

## 2015-01-12 LAB — CLOSTRIDIUM DIFFICILE(ARMC)

## 2015-01-12 LAB — VANCOMYCIN, TROUGH: VANCOMYCIN, TROUGH: 15 ug/mL

## 2015-01-13 LAB — CBC WITH DIFFERENTIAL/PLATELET
Basophil #: 0 10*3/uL (ref 0.0–0.1)
Basophil %: 0.1 %
EOS PCT: 1.1 %
Eosinophil #: 0.1 10*3/uL (ref 0.0–0.7)
HCT: 36.5 % (ref 35.0–47.0)
HGB: 11.4 g/dL — ABNORMAL LOW (ref 12.0–16.0)
LYMPHS PCT: 11.6 %
Lymphocyte #: 1.5 10*3/uL (ref 1.0–3.6)
MCH: 27.1 pg (ref 26.0–34.0)
MCHC: 31.2 g/dL — ABNORMAL LOW (ref 32.0–36.0)
MCV: 87 fL (ref 80–100)
MONOS PCT: 13.7 %
Monocyte #: 1.7 x10 3/mm — ABNORMAL HIGH (ref 0.2–0.9)
NEUTROS PCT: 73.5 %
Neutrophil #: 9.3 10*3/uL — ABNORMAL HIGH (ref 1.4–6.5)
PLATELETS: 267 10*3/uL (ref 150–440)
RBC: 4.19 10*6/uL (ref 3.80–5.20)
RDW: 14.7 % — AB (ref 11.5–14.5)
WBC: 12.7 10*3/uL — AB (ref 3.6–11.0)

## 2015-01-13 LAB — BASIC METABOLIC PANEL
Anion Gap: 9 (ref 7–16)
BUN: 27 mg/dL — AB
CREATININE: 0.94 mg/dL
Calcium, Total: 8.4 mg/dL — ABNORMAL LOW
Chloride: 98 mmol/L — ABNORMAL LOW
Co2: 31 mmol/L
EGFR (Non-African Amer.): >60
Glucose: 176 mg/dL — ABNORMAL HIGH
Potassium: 3.7 mmol/L
SODIUM: 138 mmol/L

## 2015-01-13 LAB — MAGNESIUM: Magnesium: 1.9 mg/dL

## 2015-01-13 LAB — URINE CULTURE

## 2015-01-14 LAB — CULTURE, BLOOD (SINGLE)

## 2015-01-15 LAB — BASIC METABOLIC PANEL
Anion Gap: 6 — ABNORMAL LOW (ref 7–16)
BUN: 22 mg/dL — AB
CO2: 32 mmol/L
Calcium, Total: 9.1 mg/dL
Chloride: 100 mmol/L — ABNORMAL LOW
Creatinine: 0.86 mg/dL
EGFR (African American): >60
Glucose: 93 mg/dL
POTASSIUM: 4.1 mmol/L
SODIUM: 138 mmol/L

## 2015-01-15 LAB — CBC WITH DIFFERENTIAL/PLATELET
BASOS PCT: 0.4 %
Basophil #: 0 10*3/uL (ref 0.0–0.1)
EOS PCT: 1.7 %
Eosinophil #: 0.2 10*3/uL (ref 0.0–0.7)
HCT: 35.9 % (ref 35.0–47.0)
HGB: 11.1 g/dL — ABNORMAL LOW (ref 12.0–16.0)
LYMPHS PCT: 14.4 %
Lymphocyte #: 1.8 10*3/uL (ref 1.0–3.6)
MCH: 26.7 pg (ref 26.0–34.0)
MCHC: 30.9 g/dL — ABNORMAL LOW (ref 32.0–36.0)
MCV: 87 fL (ref 80–100)
MONO ABS: 1.9 x10 3/mm — AB (ref 0.2–0.9)
Monocyte %: 15.6 %
Neutrophil #: 8.4 10*3/uL — ABNORMAL HIGH (ref 1.4–6.5)
Neutrophil %: 67.9 %
Platelet: 314 10*3/uL (ref 150–440)
RBC: 4.14 10*6/uL (ref 3.80–5.20)
RDW: 14.9 % — ABNORMAL HIGH (ref 11.5–14.5)
WBC: 12.4 10*3/uL — AB (ref 3.6–11.0)

## 2015-01-23 NOTE — Discharge Summary (Signed)
PATIENT NAME:  [name], [name] MR#:  [id number] DATE OF BIRTH:  28-Mar-1941  DATE OF ADMISSION:  05/15/2012 DATE OF DISCHARGE:  05/18/2012   DISCHARGE DISPOSITION: [facility]   PRIMARY DOCTOR: Lorie Phenix, MD   DISCHARGE DIAGNOSES: 1. Acute respiratory failure secondary to narcotic overuse.  2. Obstructive sleep apnea.  3. Metabolic encephalopathy because of elevated CO2.  4. Proteus mirabilis urinary tract infection.  5. Hypothyroidism.  6. Hypertension.  7. History of diastolic heart failure.  8. Hyperlipidemia.  9. Diabetes mellitus, type II.  10. Anxiety psychosis and depression.  11. Chronic back pain and leg pain.  12. Restless leg syndrome.  13. Depression. 14. Chronic obstructive pulmonary disease.   DISCHARGE MEDICATIONS:  1. Cymbalta 60 mg p.o. b.i.d.  2. Multivitamin 1 tablet daily.  3. Synthroid 150 mcg daily.  4. Aspirin 325 mg p.o. daily.  5. Plavix 75 mg daily. 6. Metoprolol 25 mg half tablet daily. 7. Florastor 250 mg p.o. b.i.d.  8. Prilosec 20 mg daily. 9. Lidoderm patch apply to the back daily and remove at night.  10. Lasix 20 mg daily.  11. KCL 10 mEq p.o. daily.  12. Spiriva 18 mcg inhalation daily. 13. Glucophage 500 one half tablet daily. Please do not give if the patient is not eating. 14. Requip 1 mg p.o. 2 times a day at 9 a.m. and 1 p.m.  15. Zocor 20 mg daily.  16. Seroquel 200 mg at bedtime. 17. Imodium as needed. 18. DuoNebs every six hours as needed.  19. Requip 1 mg tablet 1.5 once a day at bedtime.  20. The patient was taking Percocet 2 tablets every four hours. I changed it, 5/500. She can take one in the morning and one at night. I explained to her when she takes too much pain medication that can cause respiratory depression and she will have respiratory failure and also metabolic encephalopathy.   NEW MEDICATIONS: 1. Milk of Magnesia as needed for constipation along with Colace.  2. She is also on ceftriaxone, Rocephin, 1 gram  IV daily. That is for Proteus urinary tract infection. She received four days of IV antibiotic but she needs another six days and also repeat urine culture after she finishes antibiotics.   3. Magnesium 200 mg p.o. b.i.d.   OXYGEN: 2 liters by nasal cannula.    DIET: Low sodium, low fat, ADA diet.   CONSULTATIONS: None.   HOSPITAL COURSE:  1. The patient is a 74 year old female with multiple medical problems who came in because of decreased responsiveness and oxygen sats were 70% when she came. She actually had no pneumonia on x-ray but she got multiple doses of Narcan because she was taking Percocet for her back pain. The patient was admitted to ICU for respiratory depression because of narcotic overuse. The patient was on BiPAP for a few hours. After that the patient's mental status improved. She was started on any Rocephin. Chest x-ray did not show any infiltrates. We had to stop her pain medications and Seroquel and other narcotics because of her respiration. The patient's mental status improved and also started to eat. Her hypoxia resolved. Right now she is saturating at 92 to 93% on room air and she is able to converse nicely without any trouble. The patient started Percocet but I told her to take it less frequently and avoid taking it for mild pain. The patient understands that. She was taking it for back pain and leg pains. The patient also had ABG  when she came which showed pH 7.19, pCO2 78, and 85.5% on 2 liters. The patient's repeat ABG showed pH 7.33, pCO2 57, pO2 84, sats 95.5 on BiPAP. She was able to come off the BiPAP and tolerated nasal cannula and mental status also improved. Metabolic encephalopathy secondary to CO2 elevation also improved.  2. Urinary tract infection. The patient's urine culture showed Proteus sensitive to Rocephin but resistant to all p.o. antibiotics, Cipro and also Levaquin. She will be needing Rocephin. She finished four days and she needs another six days and then  repeat urine cultures.  3. Hypomagnesemia. On admission her magnesium was very low on multiple occasions around 1.6 so she received IV magnesium and started on p.o. magnesium. She had a repeat magnesium drawn yesterday morning and it was 2. She will be going to the nursing home with p.o. magnesium.  4. Diabetes mellitus, type II. The patient was started on diet. She is on metformin 250 mg daily. That can be continued. Her renal function is stable. Creatinine is 1.12 and the patient's GFR is around 50. She can continue that.  5. Chronic neck pain and back pain. As I mentioned, she can take Percocet sparingly.  6. History of diastolic heart failure. No exacerbation here. She can continue her Lasix and beta-blockers and continue her Zocor as well.  7. History of restless leg syndrome. Continue Requip.  8. History of hypothyroidism. She can continue Synthroid.   TIME SPENT ON DISCHARGE PREPARATION: More than 30 minutes. [facility] will [facility].   ____________________________ Katha HammingSnehalatha Ishaq Maffei, MD sk:drc D: 05/18/2012 13:29:28 ET T: 05/18/2012 15:04:19 ET JOB#: [id number]  cc: Katha HammingSnehalatha Kervens Roper, MD, <Dictator> Leo GrosserNancy J. Maloney, MD Katha HammingSNEHALATHA Ainsleigh Kakos MD ELECTRONICALLY SIGNED 06/02/2012 14:45

## 2015-01-23 NOTE — H&P (Signed)
PATIENT NAME:  [name], [name] MR#:  [id number] DATE OF BIRTH:  25-Aug-1941  DATE OF ADMISSION:  05/15/2012  PRIMARY CARE PHYSICIAN: Dr. Lorie PhenixNancy Maloney.   CHIEF COMPLAINT: Decreased responsiveness and oxygen desaturation.   HISTORY OF PRESENT ILLNESS: Ms. [name] is [name] 74 year old Caucasian female, nursing home resident, noticed at night that she had decreased responsiveness and her oxygen saturation went down to 70%. The nurses applied oxygen supply via cannula until EMS arrived when they shifted to BiPAP treatment. The patient was transferred to the Emergency Department. Evaluation here was consistent with acute respiratory failure and CO2 narcosis. The patient while she is here and on BiPAP treatment, she started to wake up. It is likely that her polypharmacy and Percocet component had contributed to her respiratory failure in addition that suspicion for obstructive sleep apnea is also raised given her obesity as well. The patient was admitted to the Intensive Care Unit for further evaluation and treatment.   REVIEW OF SYSTEMS: Ten point system review was not obtainable due to the patient's lethargy and lack of participation. Right now she is starting to improve to wake up, but is not giving meaningful history.   PAST MEDICAL HISTORY:  1. Last admission here was in September 2012. At that time she had altered mental status attributed to polypharmacy, baclofen and Klonopin use as well.  2. History of paroxysmal atrial fibrillation. 3. Systemic hypertension. 4. Congestive heart failure based on diastolic dysfunction, but normal systolic function. Her last echocardiogram was in August 2012 showing ejection fraction of 55%.  5. Hypothyroidism, on replacement therapy.  6. Diabetes mellitus, type II. 7. Hyperlipidemia. 8. Obesity. 9. Past history of alcohol abuse.  10. Anxiety and psychosis.   SOCIAL HABITS: Unobtainable due to the patient's lethargy.   SOCIAL HISTORY: The patient is [name] resident of  local nursing home.   FAMILY HISTORY: Unobtainable due to the patient's decreased responsiveness.   ADMISSION MEDICATIONS:  1. Zocor 20 mg [name] day. 2. Synthroid 150 mcg once [name] day.  3. Spiriva once [name] day. 4. Seroquel 200 mg once [name] day. 5. Requip 1 mg twice [name] day at 9:00 [name].m. and 1:00 p.m. and also 1.5 mg at bedtime.  6. Prilosec 20 mg once [name] day.  7. Potassium chloride 10 mEq once [name] day. 8. Plavix 75 mg [name] day.  9. Percocet 5/325, two tablets three times [name] day and also another tablet whenever needed. 10. Nitroglycerin 0.4 mg q.5 minutes p.r.n.  11. Multivitamin once [name] day. 12. Metoprolol 25 mg 1/2 tablet once [name] day.  13. Lidoderm patch once [name] day. 14. Lasix 20 mg [name] day.  15. Imodium p.r.n.  16. Glucophage 500 mg taking 1/2 tablet once [name] day. 17. Florastor 250 mg 1 capsule twice [name] day. 18. Cymbalta 60 mg twice [name] day.  19. Artificial tears two drops to affected eye whenever needed.   ALLERGIES: Amiodarone.   PHYSICAL EXAMINATION:  VITAL SIGNS: Blood pressure 118/56, respiratory rate 20, pulse 78, temperature 97.1, oxygen saturation 97%.   GENERAL APPEARANCE: Elderly, morbidly obese lady lying in bed, treated with BiPAP.   HEAD: No pallor. No icterus. No cyanosis.   ENT: Hearing was normal. When the patient is aroused, she will respond with her head or arm movement. Nasal mucosa, lips, tongue were unremarkable.   EYES: Normal eyelids and conjunctiva. Pupils about 4 to 5 mm, equal and reactive to light. Examination of the eyes was inadequate due to the patient's noncooperation.   NECK: Supple. Trachea  at midline. No thyromegaly. No cervical lymphadenopathy. No masses.   HEART: Normal S1, S2. No S3, S4. No murmur. No gallop. No carotid bruits.   RESPIRATORY: Normal breathing pattern without use of accessory muscles. No rales. No wheezing.   ABDOMEN: Soft without tenderness. She is morbidly obese. No hepatosplenomegaly. No masses. No hernias.   SKIN: No ulcers. No subcutaneous  nodules.   MUSCULOSKELETAL: No joint swelling. No clubbing.   NEUROLOGIC: Cranial nerves II through XII are intact. No focal motor deficits. She is moving her four extremities.   PSYCHIATRIC: I could not assess well due to the patient's decreased responsiveness.   LABORATORY, DIAGNOSTIC, AND RADIOLOGICAL DATA: Chest x-ray showed poor inspiratory effort. No consolidation, no effusion. EKG showed normal sinus rhythm at [name] rate of 76 per minute. Otherwise unremarkable EKG. Blood sugar 144, BUN 22, creatinine 1.1, sodium 142, potassium 4.4. Total CPK 88 with troponin less than 0.02. CBC showed white count of 11,000, hemoglobin 12, hematocrit 39, platelet count 233. Urinalysis showed +3 leukocyte esterase, white blood cells 186. Bacteria +1. ABG showed pH of 7.19, pCO2 78, pO2 63, base excess -0.6.   ASSESSMENT:  1. Acute respiratory failure likely secondary to her medications, in particular the Percocet with possible contribution from potential underlying obstructive sleep apnea.  2. Altered mental status and lethargy secondary to CO2 narcosis. 3. Urinary tract infection.  4. Possible underlying obstructive sleep apnea.  5. Hypothyroidism. We need to check her TSH to ensure adequate replacement.  6. Systemic hypertension.  7. History of diastolic congestive heart failure with normal ejection fraction at 55% by echocardiogram in 2012.  8. Hyperlipidemia.  9. Mild diabetes mellitus, type II. 10. Anxiety disorder, psychosis and depression.   PLAN:  1. Will admit the patient to the Intensive Care Unit.  2. Continue treatment with BiPAP and she started to improve.  3. Repeat arterial blood gas while she is on BiPAP.  4. Urine for culture and sensitivity and start empiric antibiotic using Rocephin 1 gram daily.  5. Check TSH to ensure that hypothyroidism is not adding to the problem.  6. Discontinue Percocet and resume the rest of the home medications.  7. Neurologic check-ups and examinations and  continuous follow-up.  8. I could not assess whether the patient has [name] LIVING WILL or not as she is not responding well for the questions. However, on her last admission here in September 2012, her code status was FULL CODE.   TIME SPENT EVALUATING THIS PATIENT INCLUDING REVIEWING MEDICAL RECORDS: More than one hour.   ____________________________ Carney Corners. Rudene Re, MD amd:ap D: 05/15/2012 06:29:10 ET T: 05/15/2012 10:47:53 ET JOB#: [id number]  cc: Carney Corners. Rudene Re, MD, <Dictator> Leo Grosser, MD Karolee Ohs Dala Dock MD ELECTRONICALLY SIGNED 05/15/2012 22:41

## 2015-01-25 LAB — URINE CULTURE

## 2015-01-26 NOTE — Consult Note (Signed)
PATIENT NAME:  [name], [name] MR#:  [id number] DATE OF BIRTH:  1940-11-27  CARDIOLOGY CONSULTATION REPORT  DATE OF CONSULTATION:  01/30/2013  CONSULTING PHYSICIAN:  Madolyn Frieze. Doyne Ellinger, MD  The patient is a pleasant 74 year old female with a past medical history of coronary artery disease, paroxysmal atrial fibrillation, hypertension, diabetes, hyperlipidemia, COPD, obstructive sleep apnea who I am asked to evaluate for atrial fibrillation. The patient does have a history of coronary artery disease. She had a cardiac catheterization in August of 2012 related to elevated enzymes. She apparently had an occluded right coronary artery. She also was noted to have a 90% circumflex and a 75% OM 1. The patient had PCI of tandem lesions in the circumflex using a Promus drug-eluting stent. The patient's last echocardiogram was performed in May of 2013. At that time she was noted to have normal LV function. There was grade 1 diastolic dysfunction. There was mild left atrial enlargement and mildly elevated pulmonary pressures. The patient does have a history of paroxysmal atrial fibrillation/flutter. She was last seen by Dr. Mariah Milling on 01/13/2013.   She was admitted on 04/26 with a decreased level of consciousness, by her report. The H and P states that she was also short of breath, but the patient has no recollection of this. She does not remember the events leading to her admission, and denies shortness of breath, chest pain, palpitations or syncope. When she arrived she was noted to be in atrial fibrillation with rapid ventricular response. She was given IV Cardizem and converted spontaneously to sinus rhythm. She was also noted to be hypoxic, and she was started on BiPAP, with improvement. She has also been diuresed. She is now in sinus rhythm, and cardiology was asked to evaluate.   Her medications at present include Tylenol, Wellbutrin XL, cholecalciferol, duloxetine 60 mg p.o. b.i.d., fluconazole inhaler, Lasix  20 mg IV q.12h., subcutaneous heparin,  levofloxacin  500 mg IV q.24 hours. She takes Synthroid 0.075 mg p.o. daily. She is on Requip, Zocor 20 mg p.o. at bedtime. She is also on Seroquel 50 mg p.o. at bedtime.   She has an allergy to AMIODARONE.   Her past medical history is significant for diabetes mellitus, hypertension, and hyperlipidemia. She has a history of hypothyroidism. She also has a history of COPD and obstructive sleep apnea. She has a history of atrial flutter as well as coronary artery disease, and has had prior PCI as outlined in the HPI. She also has a history of diastolic congestive heart failure. She has had prior polyps. She has had a prior tonsillectomy, back surgery and right knee replacement. She has also had previous abdominal surgery for colonic polyps.   SOCIAL HISTORY: She smokes. She denies alcohol use.   Her family history is positive for coronary artery disease.   REVIEW OF SYSTEMS: She denies any headaches, fevers, chills, productive cough, hemoptysis, dysphagia, odynophagia, melena or hematochezia. There is no dysuria or hematuria. There is no rashes or seizure activity. There is no orthopnea, PND, or pedal edema. Note, she does not ambulate. She lives in a nursing home and is mobile via a wheelchair.   Her physical exam shows a blood pressure of 148/76. Her temperature is 97.9. Her pulse is 81. She is well-developed and somewhat obese. She is no acute distress at present.   Her skin is warm and dry. She is not to be depressed. No peripheral clubbing. Her back is normal.   HEENT is normal, with normal eyelids.  Her neck is supple, with a normal upstroke bilaterally. There is a right carotid bruit. There is no jugular venous distention. I cannot appreciate thyromegaly.   Her chest shows minimal crackles at the bases.   Cardiovascular reveals a regular rate and rhythm, with normal S1 and S2. There is a 2/6 systolic murmur at the left sternal border.   Abdominal  exam nontender. Positive bowel sounds. No hepatosplenomegaly. No masses appreciated. There is no abdominal bruit.   Her femoral pulses are not palpated. Her extremities show trace edema. There are chronic skin changes, and there are 2+ dorsalis pedis pulses bilaterally.   Her neurologic exam is grossly intact.   Her troponins have been negative. Digoxin level is less than 0.1.   Her BNP was elevated on admission. Her sodium is 136, with a potassium of 4.5. BUN and creatinine are 34 and 1.11. Glucose is 155.   Liver functions were normal. Albumin was 2.7. Her initial BNP was 15,685.   Her white blood cells are 10.7, hemoglobin 11.2, hematocrit 35, platelet  count is 238. Blood gas 7.23, pCO2 76, pO2 89, and bicarbonate 32. Saturations 96%.   Chest x-ray on admission showed right lower lobe airspace disease concerning for atelectasis versus pneumonia.   Urinalysis showed white blood cells and leukocyte esterase.   Cultures are pending at this time.   Her electrocardiogram on admission showed atrial fibrillation at a rate of 125. The axis was normal. There were nonspecific ST changes. She did convert to sinus rhythm with Cardizem   DIAGNOSES:  1.  Paroxysmal atrial fibrillation: The patient has a history of paroxysmal atrial fibrillation. On presentation she was in atrial fibrillation, but has converted spontaneously to sinus rhythm.  I would recommend resuming her metoprolol for rate control. We will begin at 12.5 mg p.o. b.i.d. and increase as tolerated. We will plan to repeat her echocardiogram to assess left ventricular function. I will also repeat a thyroid-stimulating hormone. She has multiple embolic risk factors including diabetes and hypertension. She also has a history of diastolic congestive heart failure. There is some risk with anticoagulation, given her relatively immobile state and occasional falls in the past when transferring to a wheelchair. However I think she would most  likely benefit from anticoagulation long-term. I will review this with Dr. Mariah Milling and we will make a final decision tomorrow morning. However it may be worthwhile to discontinue her Plavix and continue aspirin 81 mg daily given her history of stent. We would then begin apixaban 5 mg p.o. b.i.d.  2.  Coronary artery disease: The patient has ruled out for a myocardial infarction and has not had chest pain. I would recommend discontinuing her Plavix and continue with aspirin 81 mg daily, as outlined under atrial fibrillation. She will continue on her statin.  Urinary tract infection: Continue antibiotics, with management per primary care.  3.  Diabetes mellitus: Continue preadmission medications with management per primary care.  4.  Hypertension: We will plan to resume her metoprolol for atrial fibrillation, which will also help with her blood pressure. She would most likely benefit from an ACE inhibitor long-term, but I will leave this to primary care.  5.  Hyperlipidemia: continue statin.  6.  Acute diastolic congestive heart failure: Most likely related to her atrial fibrillation. Her volume status appears to be improving. Continue with gentle diuresis, and she will most likely need Lasix 20 mg daily as an outpatient, which she was on prior to this admission.  7.  History  of respiratory failure and altered mental status: Management per primary care.     ____________________________ Madolyn FriezeBrian S. Jens Somrenshaw, MD bsc:dm D: 01/30/2013 12:41:59 ET T: 01/30/2013 13:09:29 ET JOB#: [id number]  cc: Madolyn FriezeBrian S. Jens Somrenshaw, MD, <Dictator> Lewayne BuntingBRIAN S Richad Ramsay MD ELECTRONICALLY SIGNED 03/17/2013 11:47

## 2015-01-26 NOTE — H&P (Signed)
PATIENT NAME:  [name], Jarelly A MR#:  [id number] DATE OF BIRTH:  [birth year]  DATE OF ADMISSION:  01/29/2013  REFERRING PHYSICIAN:  Si Raiderhristine Braud, MD  PRIMARY CARE PHYSICIAN: Lorie PhenixNancy Maloney, M.D.   CHIEF COMPLAINT: Shortness of breath.   HISTORY OF PRESENT ILLNESS: This is a 74 year old female who is a resident at [facility] for the last 2 years, nonambulatory at baseline using a wheelchair.  The patient was sent for shortness of breath, was noticed to be saturating 77% at the nursing home. In the ED, the patient was noticed to be in atrial fibrillation with RVR. As well, she was hypotensive with systolic blood pressure in the low 90s. The patient was given IV Cardizem 10 mg IV push, where she converted after that to normal sinus rhythm, but blood pressure remained on the lower side.  The patient as well was found to be in acute respiratory failure with her ABG  showing elevated CO2 level and decreased oxygen level as well, where she was started on BiPAP, where her oxygen saturation improved after that. The patient as well appears to be in volume overload as she is having significant lower extremity edema and having elevated pro-BNP as well, her chest x-ray did show evidence of volume overload, but she did not receive any Lasix in the ED he is to her soft blood pressure. The patient was started on Rocephin in the nursing home, IM, Rocephin for urinary tract infection. Her urinalysis did show significant UTI where the patient was given empirically in the ED vancomycin and Zosyn in the ED.  The patient denies any chest pain, denies any cough, any productive sputum, fever or chills.  Most of the history was obtained from the daughter over the phone. Hospitalist service was requested to admit the patient for further management of her acute respiratory failure and urinary tract infection.   PAST MEDICAL HISTORY: 1.  History of obstructive sleep apnea.  2.  Hypothyroidism.  3.  Systemic  hypertension.  4.  Diabetes mellitus, type II.  5.  Hyperlipidemia.  6.  Diastolic congestive heart failure with last ejection fraction 2012 showing 55%.  7.  Obesity.  8.  History of depression.  9.  History of COPD. 10.  History of anxiety and psychosis.  11.  History of remote alcohol abuse.   PAST SURGICAL HISTORY:  1.  History of cardiac stent.  2.  Right knee surgery.  3.  Appendectomy.  4.  Tonsillectomy.   SOCIAL HISTORY: The patient is a resident at [facility] in a nursing home. Has history of remote alcohol abuse. Smokes 1/2 pack to his per day in the past.   FAMILY HISTORY: Significant for alcohol abuse in the family.   ALLERGIES: AMIODARONE.   HOME MEDICATIONS: 1.  Percocet as needed.  2.  Cymbalta 60 mg oral p.o. b.i.d.  3.  Zocor 20 mg oral daily.  4.  Requip 1 mg oral 2 times a day.  5.  DuoNeb as needed.  6.  Seroquel 50 mg at bedtime.  7.  Rocephin IM 1 gram daily.  8.  Lasix 60 mg oral daily.  9.  Flow starts 50 mg oral 2 times a day.  10.  Potassium chloride 30 mEq oral daily.  11.  Wellbutrin 150 mg oral daily.  12.  Synthroid 75 mcg oral daily.  13.  Vitamin D3 5000 units oral daily.   REVIEW OF SYSTEMS:   CONSTITUTIONAL:  The patient denies any fever or  chills. Has generalized weakness and fatigue.  EYES: Denies blurry vision, double vision, inflammation.  ENT: Denies tinnitus, ear pain, hearing loss.   RESPIRATORY: Complains of cough, nonproductive, complains of shortness of breath on nasal cannula at bedtime. Has history of COPD. CARDIOVASCULAR: Denies chest pain, arrhythmia, palpitations, syncope. Has lower extremity edema.  GASTROINTESTINAL: Denies nausea, vomiting, diarrhea, abdominal pain, hematemesis, rectal bleed or melena.  GENITOURINARY: Denies dysuria, hematuria, hematuria, renal colic. ENDOCRINE:  Denies polyuria, polydipsia heat or cold intolerance.  HEMATOLOGY: Denies anemia, easy bruising, bleeding diathesis.  INTEGUMENTARY:  Denies acne, rash or skin lesions.  MUSCULOSKELETAL: Ambulates only with the wheelchair, has limited activity. Has restless leg syndrome.  NEUROLOGICAL: Denies CVA, TIAs, seizures, headache, ataxia, vertigo.  PSYCHIATRIC: Has history of depression, alcohol abuse and anxiety.   PHYSICAL EXAMINATION: VITAL SIGNS: Temperature 98.5, pulse 68, respiratory rate 22, blood pressure 102/55, saturating 97% on BiPAP.  GENERAL: Morbidly obese female asleep, looks comfortable in bed in no apparent distress.  HEENT: Head normocephalic. Pupils equal, reactive to light. Pink conjunctivae. Anicteric sclerae. Moist oral mucosa.  NECK: Supple. No thyromegaly. No JVD.  CHEST: Fair air entry bilaterally. No wheezing, rales, rhonchi. Has bibasilar crackles.  CARDIOVASCULAR: S1, S2 heard. No rubs, murmurs or gallops.  ABDOMEN: Obese, soft, nontender, nondistended. Bowel sounds present. EXTREMITIES:  Has +2 edema bilaterally. No clubbing. No cyanosis.  PSYCHIATRIC: The patient awake and alert x 2 to 3.  NEUROLOGIC: Cranial nerves grossly intact. No focal deficits.  SKIN: Normal skin turgor. Warm and dry.   PERTINENT LABORATORY AND DIAGNOSTIC DATA:  Glucose 155 pro-BNP 58,686, BUN 34, creatinine 1.11, sodium 136 potassium 4.5, chloride 101, troponin less than 0.02. White blood cells 10.7, hemoglobin 11.2, hematocrit 35, platelets 238, INR 1.1. Urinalysis showing trace bacteria, +3 leukocyte esterase with 164 white blood cells. ABG is showing pH of 7.27, pCO2 of 67, pO2 of 59.  Initial EKG showing atrial fibrillation with RVR at a rate of 125.   ASSESSMENT AND PLAN: 1.  Acute respiratory failure, combined respiratory failure, hypercarbic and hypoxic, much improved on BiPAP. This is most likely related to her acute diastolic congestive heart failure as well her atrial fibrillation with rapid ventricular rate initially was contributing to this.  As well she is known to have history of obstructive sleep apnea and chronic  obstructive pulmonary at baseline, with her body habitus as well restrictive disease as well.  2.  Diastolic congestive heart failure. The patient appears to be in acute decompensated diastolic congestive heart failure at this point.  It is unclear when was the last echo done as the patient was seen recently by cardiology. So will hold on reordering echo to assess for ejection fraction and function until a.m. when patient is seen by the cardiologist as she might of have recent echo done as an outpatient.  Her blood pressure is on the lower side so will hold on diuresis of the patient at this point. I will check daily weights, strict ins and outs and will consult cardiology.  3.  Atrial fibrillation with rapid ventricular rate, currently converted to normal sinus rhythm after Cardizem injection, heart rate controlled. We will consult cardiology regarding recommendation for anticoagulation.  4.  Urinary tract infection. We will start the patient on levofloxacin and will follow on the urine culture.  5.  Hypothyroidism. Continue with Synthroid.  6. Systemic hypertension. Blood pressure is on the lower side. We will monitor.  No antihypertensive medications currently. 7.  Hyperlipidemia. Continue with statin.  8.  History of depression. Continue with Cymbalta.  9. Chronic obstructive pulmonary disease. No wheezing. Does not appear to be in chronic obstructive pulmonary disease exacerbation. We will continue with DuoNeb as needed.  10.  Deep vein thrombosis prophylaxis. Subcutaneous heparin.  11.  CODE STATUS: Discussed with the daughter she is not unaware of the patient's code status.  She will try to get her Living Will in the morning. Meanwhile, she will be kept full code.   Total time spent on admission and patient care: 65 minutes.    ____________________________ Starleen Arms, MD dse:ct D: 01/29/2013 03:59:17 ET T: 01/29/2013 07:31:15 ET JOB#: [id number]  cc: Starleen Arms, MD,  <Dictator> Conleigh Heinlein Teena Irani MD ELECTRONICALLY SIGNED 01/30/2013 0:21

## 2015-01-26 NOTE — H&P (Signed)
PATIENT NAME:  [name], [name] MR#:  [id number] DATE OF BIRTH:  November 16, 1940  DATE OF ADMISSION:  12/31/2012  PRIMARY CARE PHYSICIAN: Lorie Phenix.   REFERRING PHYSICIAN: Bayard Males.   CHIEF COMPLAINT: Decreased level of consciousness and altered mental status.   HISTORY OF THE PRESENT ILLNESS: The patient is a 74 year old Caucasian female, resident of [facility] nursing [facility]. She was noticed to be less responsive and got worse with time. The patient was transferred here to the Emergency Department via EMS for further evaluation. The patient is in deep sleep and does not respond to verbal communication but arousable partially for a few seconds upon shaking her. Then, she will be back to sleep. Evaluation here included CAT scan of the head, showed no acute findings. Her CBC and chemistry were unremarkable. Her TSH is mildly elevated. The patient received 1 dose of Narcan, during which the patient became more alert and communicating for about 10 minutes, then back dipping into sleep again and difficult to arouse. I ordered ABG which showed evidence of respiratory acidosis and elevated pCO2. The patient was admitted for further evaluation and treatment and monitoring. Her picture is consistent with altered mental status secondary to metabolic encephalopathy from CO2 narcosis secondary to overtreatment with narcotics. She also has underlying urinary tract infection.   REVIEW OF SYSTEMS: A 10-point system review is unobtainable due to the patient's altered mental status. She does not respond to questions, but I was able to ask her a question just for a few seconds when she woke up. She understands that she came to the hospital and she stated "it is difficult for me to stay awake." Then, she is back sleeping again. She denied having any pain anywhere and no focal weakness.   PAST MEDICAL HISTORY: Last admission to this hospital was in August of 2013 with a similar picture of decreased responsiveness and  that was also secondary to narcotic overdose. She also has a history of obstructive sleep apnea, hypothyroidism, systemic hypertension, diabetes mellitus type 2, hyperlipidemia, diastolic congestive heart failure which is chronic with normal systolic function. Her last echocardiogram was in August of 2012 showing ejection fraction of 55%. Obesity, past history of alcohol abuse, anxiety, psychosis, history of depression and chronic obstructive pulmonary disease.   PAST SURGICAL HISTORY: Cardiac stenting, right knee surgery, thyroidectomy, tonsillectomy.   SOCIAL HABITS: Unobtainable from the patient due to the patient's lethargy.   SOCIAL HISTORY: The patient is now a resident of [facility].   FAMILY HISTORY: Again, I cannot obtain family history information from the patient due to her lethargy and extreme sleepiness.   MEDICATION LIST: Tylox or oxycodone with acetaminophen 5/500 twice a day. Seroquel 75 mg. It is unclear from the nursing home notes how frequent she gets that. In the past, it used to be 200 mg a day. Cymbalta 60 mg twice a day. Vitamin D3 50,000 units once a month. Magnesium oxide 200 mg twice a day. Requip 1 mg twice a day. Oxygen at night 2 liters. Wellbutrin XL 150 mg once a day. Zocor 20 mg a day. Spiriva 1 inhalation once a day. Synthroid 175 mcg once a day. Neurontin 100 mg once a day. Plavix 75 mg a day. Potassium chloride extended release 10 mEq once a day. Aspirin 325 mg once a day. Glucophage 250 mg once a day. Lasix 20 mg a day. Metoprolol tartrate 12.5 mg once a day. Multivitamin once a day. Protonix 40 mg a day.   ALLERGIES:  AMIODARONE.    PHYSICAL EXAMINATION:  VITAL SIGNS: Blood pressure 112/52. Respiratory rate 20; however there are periods of apnea noted. Pulse 90. Temperature 98.9. Oxygen saturation was 97%.  GENERAL APPEARANCE: Elderly female sleeping in bed in no acute distress.  HEAD AND NECK: No pallor. No icterus. No cyanosis. Ear examination:  Hearing was normal when she is aroused. No visible lesions, no ulcers, no discharge. Examination of the nose was normal without discharge. No bleeding. No ulcers. Oropharyngeal examination was limited due to patient uncooperation. No visible lesions, no oral thrush. Eye examination revealed normal eyelids and conjunctiva, although the conjunctivae, both of them, appear congested. Pupils were constricted, could not see reactivity to light. Neck is supple. Trachea at midline. No masses. No lymphadenopathies.  HEART: Normal S1, S2. Distant heart sounds. No murmur was appreciated. No gallop. No carotid bruits.  RESPIRATORY: Normal breathing pattern. Intermittently, she will have periods of apnea. No rales. No wheezing.  ABDOMEN: Soft without tenderness. No hepatosplenomegaly. No masses. No hernias.  SKIN: No ulcers. No subcutaneous nodules.  MUSCULOSKELETAL: No joint swelling. No clubbing.  NEUROLOGIC: Cranial nerves II through XII were intact. No focal motor deficit. The patient can be aroused after shaking her but that would be for a brief period of time. She is able to move her upper and lower extremities with no focal weakness.  PSYCHIATRIC: The patient is very sleepy and cannot obtain adequate psych questions and answers.   LABORATORY FINDINGS: CAT scan of the head showed no acute findings. There is mild cerebral and cerebellar atrophy. Serum glucose 168, BUN 23, creatinine 1.02, sodium 138, potassium 4.9. Liver function tests were normal and liver transaminases as well with the exception her albumin is low at 2.7. Troponin less than 0.02. Her TSH is slightly elevated at 6.5. CBC showed white count of 10,000, hemoglobin 12, hematocrit 37, platelet count 354. Urinalysis showed +3 leukocyte esterase, 153 white blood cells. Arterial blood gas showed a pH of 7.32, pCO2 66, pO2 85 and this is on FiO2 of 28%.   ASSESSMENT:  1. Altered mental status.  2. Metabolic encephalopathy secondary to CO2 narcosis,  precipitated by narcotics and possibly additional factor from obstructive sleep apnea.  3. Acute respiratory failure 4. Obstructive sleep apnea 5. Urinary tract infection.  6. Systemic hypertension.  7. Paroxysmal atrial fibrillation.  8. Diabetes mellitus, type 2.  9. Chronic diastolic congestive heart failure.  10. Obesity.  11. Hypothyroidism.  12. Hyperlipidemia.  13. Anxiety, depression and psychosis by history.   PLAN: Neuro checks frequently to follow up on her condition. Hold all medications and keep the patient also n.p.o. until she is fully awake. Will try CPAP. Monitor patient responsiveness. Urine for culture and sensitivity. Meanwhile, will start her on intravenous ciprofloxacin for her urinary tract infection.   Time spent in evaluating this patient took more than 55 minutes, including reviewing her medical records. The patient is not fully awake to get information about Living Will or about her code status.    ____________________________ Carney CornersAmir M. Rudene Rearwish, MD amd:gb D: 12/31/2012 06:56:29 ET T: 12/31/2012 07:09:56 ET JOB#: [id number]  cc: Carney CornersAmir M. Rudene Rearwish, MD, <Dictator> Zollie ScaleAMIR M Erryn Dickison MD ELECTRONICALLY SIGNED 01/01/2013 3:38

## 2015-01-26 NOTE — Discharge Summary (Signed)
PATIENT NAME:  [name], [name] MR#:  [id number] DATE OF BIRTH:  [birth year]  DATE OF ADMISSION:  01/29/2013 DATE OF DISCHARGE:    ADDENDUM:  To discharge summary dictated by Dr. Mordecai MaesSanchez on May 1.  The patient is ready for discharge to skilled facility. Her CPAP has been titrated. Dr. Meredeth IdeFleming recommends to prescribe auto CPAP, setting anywhere from 5 to 20. The patient is tolerating current settings. She tolerated settings of 15 cm of water on her CPAP. She will follow up with Dr. Meredeth IdeFleming as outpatient to get follow up auto titration done as outpatient. The patient otherwise is hemodynamically stable and ready for discharge.   ____________________________ [name], MD sap:ce D: 02/04/2013 10:55:17 ET T: 02/04/2013 11:46:45 ET JOB#: [id number]  cc: Trevian Hayashida [name]. Allena KatzPatel, MD, <Dictator> Willow OraSONA [name] Amro Winebarger MD ELECTRONICALLY SIGNED 02/22/2013 15:19

## 2015-01-26 NOTE — Discharge Summary (Signed)
PATIENT NAME:  [name], [name] MR#:  [id number] DATE OF BIRTH:  02/10/1941  DATE OF ADMISSION:  01/29/2013 DATE OF DISCHARGE:    ADDENDUM  FINAL DISCHARGE MEDICATIONS:  1.  Tylenol 650 q.4 p.r.n.  2.  Zofran 4 mg q.6 p.r.n.  3.  Duloxetine 60 mg b.i.d.  4.  Simvastatin 20 mg at bedtime.  5.  Requip 1 mg b.i.d.  6.  Quetiapine 50 mg at bedtime.  7.  Bupropion XL 150 mg daily.  8.  Levothyroxine 0.075 mg p.o. daily.  9.  Vitamin D3, 1000 units daily.  10.  Spiriva 1 capsule inhalation daily.  11.  Flovent HFA 220 mcg inhaler 1 puff b.i.d.  12.  Aspirin 81 mg daily.  13.  Lopressor 12.5 mg b.i.d.  14.  Lasix 40 mg daily.  15.  Percocet 5/325, 1 to 2 q.6 p.r.n.  16.  MiraLax 17 grams orally b.i.d.  17.  Cardizem 30 mg q.6 hourly.  18.  Xarelto 20 mg q. 5:00 p.m.  19.  Linezolid 600 mg b.i.d. for 1 more day.  20.  Levaquin 750 mg for 1 more day.   ____________________________ Jearl KlinefelterSona [name]. Allena KatzPatel, MD sap:jm D: 02/04/2013 14:44:44 ET T: 02/04/2013 16:35:38 ET JOB#: [id number]  cc: Dillion Stowers [name]. Allena KatzPatel, MD, <Dictator> Willow OraSONA [name] Shuan Statzer MD ELECTRONICALLY SIGNED 02/22/2013 15:19

## 2015-01-26 NOTE — Discharge Summary (Signed)
PATIENT NAME:  [name], [name] MR#:  [id number] DATE OF BIRTH:  [birth year]  DATE OF ADMISSION:  12/31/2012 DATE OF DISCHARGE:  01/04/2013  CHIEF COMPLAINT: Altered mental status, decreased level of consciousness.   PRIMARY CARE PHYSICIAN: Lorie PhenixNancy Maloney, MD   DISCHARGE DIAGNOSES: 1.  Metabolic encephalopathy, secondary to urinary tract infection and opiates/polypharmacy.  2.  Obstructive sleep apnea.  3.  Hypothyroidism.  4.  Hypertension.  5.  Diabetes.  6.  Hyperlipidemia.  7.  Diastolic congestive heart failure.  8.  Obesity.  9.  History of alcohol abuse.  10.  Anxiety.  11.  Psychosis history.  12.  History of depression.  13.  History of chronic obstructive pulmonary disease.   DISCHARGE MEDICATIONS: 1.  Cymbalta 60 mg 2 times a day. 2.  Multivitamin 1 tab daily. 3.  Aspirin 325 mg daily. 4.  Metoprolol tartrate 25 mg 1/2 tab once a day. 5.  Florastor 250 mg 2 times a day. 6.  Lasix 20 mg daily. 7.  Spiriva 18 mcg 1 cap daily inhaled. 8.  Glucophage 500 mg 1/2 tab once a day.  9.  Requip 1 mg 1-1/2 tabs once a day at bedtime. 10.  Zocor 20 mg at bedtime. 11.  Artificial Tears 2 drops to each affected eye every 4 hours as needed.  12.  DuoNebs 3 mL 4 times a day. 13.  Senna 50/8.6 mg 2 times a day. 14.  Protonix 40 mg daily. 15.  Neurontin 100 mg once a day. 16.  Potassium chloride 10 milliequivalents daily. 17.  Synthroid 175 mcg daily.  18.  Vitamin D3 50,000 international units once a month. 19.  Wellbutrin XL 150 mg every 24 hours.  20.  Claritin 10 mg once a day as needed. 21.  Lidoderm patch p.r.n. to knees once a day. 22.  Maalox 30 mL 4 times a day as needed for constipation. 23.  Tylenol 650 mg 3 times a day as needed for pain. 24.  Requip 1 mg 2 times a day at 9:00 a.m. and 1:00 p.m.  25.  Acetaminophen/oxycodone 500/5 mg 1 cap 2 times a day with meals as needed only and please do not make this a standing medication.  26.  Quetiapine 100 mg once a day at  bedtime. 27.  Amoxicillin 875 mg 1 tab every 12 hours until done.   DIET: Low sodium, ADA diet.   ACTIVITY: As tolerated.   DISCHARGE INSTRUCTIONS:  Please follow with PCP.  CPAP at night for your obstructive sleep apnea.  DISPOSITION: Back to [facility].  SIGNIFICANT LABS AND IMAGING: Initial BUN 23, creatinine 1.02 and albumin 2.7, otherwise LFTs within normal limits. TSH was 6.57 and free thyroxine was 0.86. Initial WBC 10.7.   Urine cultures grew Enterococcus faecium.  UA was positive with 3+ leukocyte esterase and 153 WBC.   Initial ABG showed pH of 7.32 and pCO2 of 66 which corrected to 7.36 and pCO2 of 58 on the same day.   CT of the brain without contrast on arrival showing cerebral atrophy without acute abnormalities.   HISTORY OF PRESENT ILLNESS AND HOSPITAL COURSE: For full details of H and P, please see the dictation on March 20th by Dr. Rudene Rearwish, but briefly this is a 74 year old morbidly obese female who lives at [facility], is on multiple medications, came in for the above chief complaint. She was noted to have elevated TSH slightly, but was confused, not responding to verbal communications, was partially arousable and was  given some Narcan which made her to be more alert, but the patient went back to sleep with difficulty to arouse and ABG showed some mild pCO2 retention. She was admitted to the hospitalist service. Urine cultures were sent as she was noted to have a positive UA.  She was noted to have metabolic encephalopathy as well which was likely secondary to combination of CO2 narcosis precipitated by narcotics and possibly polypharmacy as well as the UTI.  Her antibiotics were adjusted as she was initially on a fluoroquinolone and the UTI was noted to be as above and currently she is on amoxicillin. Her thyroid medications were adjusted. Her mentation is back to normal and we have made narcotics on an as need basis only and decreased the dose of Seroquel. At this  point she will be discharged back to her facility. The patient is FULL CODE.   TOTAL TIME SPENT: 35 minutes.  ____________________________ Traci Eaton, MD sa:sb D: 01/04/2013 14:34:21 ET T: 01/04/2013 14:53:11 ET JOB#: [id number]  cc: Traci Eaton, MD, <Dictator> Traci Eaton MD ELECTRONICALLY SIGNED 01/05/2013 16:46

## 2015-01-27 NOTE — Consult Note (Signed)
PATIENT NAME:  Traci [name], Traci [name] MR#:  [id number] DATE OF BIRTH:  [birth year]  DATE OF CONSULTATION:  01/30/2014  REQUESTING PHYSICIAN:  Dr. Angelica Ranavid Hower CONSULTING PHYSICIAN:  Sheppard Plumberimothy E. Thelma Bargeaks, MD  DATE OF CONSULTATION: 01/30/2014.   REASON FOR CONSULTATION: Management of pain secondary to traumatic rib fractures.   I have personally seen and examined the patient. I have reviewed her chest x-rays.   HISTORY OF PRESENT ILLNESS: This is [name] who has been [name] resident of [name] for [name] considerable period of time. She states she is unable to walk secondary to [name] neuropathy because of her diabetes. She has been taken care of at home, but recently, her son passed away and she was unable to be cared for and was transferred to [name] facility. [name] However, the pain got increasingly worse and then about 2 weeks ago she fell off the bed and landed on her left side. Then, the pain began to get much worse after that. She recently was brought into the hospital because she could not tolerate the pain and some chest x-rays were made which revealed what appears to be some chronic rib fractures on the left side. I have independently reviewed those films and I do not see any complicating features such as pneumothorax or pleural effusion on them. There is [name] thick callus around the bone fragments and, therefore, they appear to be chronic in nature.   PHYSICAL EXAMINATION:  GENERAL: She is [name] morbidly obese white female in no distress.  RESPIRATORY:  Her lungs were very distant bilaterally.  CARDIOVASCULAR:  Her heart was regular without murmurs, although again it was very difficult to hear any heart sounds. She had minimal tenderness to palpation along the left anterior and lateral chest  wall.  ABDOMEN: Her abdomen was soft and nontender. It was impossible to feel any masses in her belly. CHEST:   There was no external trauma appreciated on the chest wall and there is no bruising or hematoma formation. There was no erythema.   I have independently reviewed the patient's films and I believe that these rib fractures are chronic.  She states that she is unable to obtain any narcotics at the nursing facility where she is currently residing because of [name] history of chronic alcoholism in the past. I am unaware of the nursing policies at this facility, but I would recommend that we continue managing her with narcotics as needed. I see no indications for any surgical intervention and would not recommend any plating or screws for the rib fractures. At the present time, I have little to offer except for medical management of her pain. One might consider [name] to rule out any possible pathology in that location accounting for the significant pain issues that she describes.   Thank you very much for allowing me to participate in her care.    ____________________________ Sheppard Plumberimothy E. Thelma Bargeaks, MD teo:dd D: 01/30/2014 17:05:14 ET T: 01/30/2014 18:31:32 ET JOB#: [id number]  cc: Marcial Pacasimothy E. Thelma Bargeaks, MD, <Dictator> Jasmine DecemberIMOTHY E Raeford Brandenburg MD ELECTRONICALLY SIGNED 02/03/2014 15:59

## 2015-01-27 NOTE — Discharge Summary (Signed)
Dates of Admission and Diagnosis:  Date of Admission 29-Jan-2014   Date of Discharge 07-Feb-2014   Admitting Diagnosis abdominal pain   Final Diagnosis severe abdominal pain- CT shows some mass- MRI doesn't- Could not do Colonoscopy- sugggested as Out pt. Severe chronic constipation. Subacute rib fracture- pain. Chronic pains. DM Htn A fib- came with digoxin toxicity- stopped- rate controlled, on Xarelto. Ch diastolic CHF Morbid obasity. UTI Possible aspiration pneumonia causing sepsis- improved.    Chief Complaint/History of Present Illness A 74 year old Caucasian female with a history of  hypertension, diabetes, hyperlipidemia, congestive heart failure, ejection fraction of 55%, presenting with rib pain. She describes one week???s duration of left-sided chest pain, mid-axillary line, at the bottom of ribs. She described the pain as sharp, eight out of 10 in intensity, worse with movements as well as deep breathing. No relieving factors. She had a fall out of her bed preceding her symptoms. She denies any other symptoms including chest pain, palpitations, or shortness of breath.   Allergies:  Lisinopril: Other  amiodarone: Unknown  Hepatic:  25-Apr-15 23:01   Bilirubin, Total 0.5  Alkaline Phosphatase 100 (45-117 NOTE: New Reference Range 08/26/13)  SGPT (ALT) 17  SGOT (AST) 15  Total Protein, Serum 8.2  Albumin, Serum  3.1  TDMs:  25-Apr-15 23:01   Digoxin, Serum  2.9 (Therapeutic range for digoxin in patients with atrial fibrillation: 0.8 - 2.0 ng/mL. In patients with congestive heart failure a therapeutic range of 0.5 - 0.8 ng/mL is suggested as higher levels are associated with an increased risk of toxicity without clear evidence of enhanced efficacy. Digoxin toxicity is commonly associated with serum levels > 2.0 ng/mL but may occur with lower levels, including those in the therapeutic range. Blood samples should be obtained 6-8 hours after administration to  assure a reasonable volume of distribution.)  27-Apr-15 04:19   Digoxin, Serum 2.03 (Therapeutic range for digoxin in patients with atrial fibrillation: 0.8 - 2.0 ng/mL. In patients with congestive heart failure a therapeutic range of 0.5 - 0.8 ng/mL is suggested as higher levels are associated with an increased risk of toxicity without clear evidence of enhanced efficacy. Digoxin toxicity is commonly associated with serum levels > 2.0 ng/mL but may occur with lower levels, including those in the therapeutic range. Blood samples should be obtained 6-8 hours after administration to assure a reasonable volume of distribution.)  Oncology:  30-Apr-15 05:17   Carcinoembryonic Antigen (CEA)  12.5 (Roche ECLIA methodology       Nonsmokers  <3.9                                                     Smokers     <5.6            [facility]            No: [id number]           [address], [place], [place] [address]           Lindon Romp, MD         [id number] Result(s) reported on 03 Feb 2014 at 11:54AM.)  Carcinoembryonic Antigen ========== TEST NAME ==========  ========= RESULTS =========  = REFERENCE RANGE =  CARCINOEMBRYONIC AB   Carbohydrate Antigen 19-9  36 (Roche ECLIA methodology  [facility]            No: [id number]           [address], [place], [place] [address]           Lindon Romp, MD         [id number] Result(s) reported on 03 Feb 2014 at 11:54AM.)  CA 27.29 (Serial Monitoring) 25.0 [facility]/ACS methodology            [facility]            No: [id number]           [address], [place], [place] [address]           Lindon Romp, MD         [phone] Result(s) reported on 03 Feb 2014 at 11:54AM.)  CA 27.29 ========== TEST NAME ==========  ========= RESULTS =========  = REFERENCE RANGE =  CA 27.29   Routine Chem:  25-Apr-15 23:01   Glucose, Serum  170  BUN  62  Creatinine (comp)  2.66   Sodium, Serum  133  Potassium, Serum  5.4  Chloride, Serum 101  CO2, Serum 25  Calcium (Total), Serum 8.9  Osmolality (calc) 288  eGFR (African American)  20  eGFR (Non-African American)  17 (eGFR values <51mL/min/1.73 m2 may be an indication of chronic kidney disease (CKD). Calculated eGFR is useful in patients with stable renal function. The eGFR calculation will not be reliable in acutely ill patients when serum creatinine is changing rapidly. It is not useful in  patients on dialysis. The eGFR calculation may not be applicable to patients at the low and high extremes of body sizes, pregnant women, and vegetarians.)  Anion Gap 7  Result Comment DIGOXIN - RESULTS VERIFIED BY REPEAT TESTING.  - NOTIFIED OF CRITICAL VALUE  - C/RACHEL HAYDEN AT 0010 01/29/14.PMH  - READ-BACK PROCESS PERFORMED.  Result(s) reported on 29 Jan 2014 at 12:14AM.  B-Type Natriuretic Peptide [facility])  [phone] (Result(s) reported on 28 Jan 2014 at 11:33PM.)  Lipase 91 (Result(s) reported on 28 Jan 2014 at 11:25PM.)  26-Apr-15 12:55   Creatinine (comp)  2.29  27-Apr-15 04:19   Creatinine (comp)  1.72    14:11   Creatinine (comp)  1.62  28-Apr-15 04:54   Creatinine (comp) 1.22  29-Apr-15 04:52   Creatinine (comp) 0.81  03-May-15 10:09   Creatinine (comp) 0.92  Cardiac:  25-Apr-15 23:01   Troponin I < 0.02 (0.00-0.05 0.05 ng/mL or less: NEGATIVE  Repeat testing in 3-6 hrs  if clinically indicated. >0.05 ng/mL: POTENTIAL  MYOCARDIAL INJURY. Repeat  testing in 3-6 hrs if  clinically indicated. NOTE: An increase or decrease  of 30% or more on serial  testing suggests a  clinically important change)  CK, Total 40 (26-192 NOTE: NEW REFERENCE RANGE  11/07/2013)  CPK-MB, Serum 1.2 (Result(s) reported on 28 Jan 2014 at 11:33PM.)  Routine Hem:  25-Apr-15 23:01   WBC (CBC)  12.0  RBC (CBC) 3.98  Hemoglobin (CBC)  11.7  Hematocrit (CBC) 36.1  Platelet Count (CBC) 292 (Result(s) reported on 28 Jan 2014  at 11:18PM.)  MCV 91  MCH 29.4  MCHC 32.4  RDW  16.7  30-Apr-15 05:17   WBC (CBC)  13.1  RBC (CBC) 3.93  Hemoglobin (CBC)  11.3  Hematocrit (CBC) 35.9  Platelet Count (CBC) 280  MCV 91  MCH 28.8  MCHC  31.6  RDW  16.9  Neutrophil % 83.1  Lymphocyte %  5.4  Monocyte % 10.8  Eosinophil % 0.3  Basophil % 0.4  Neutrophil #  10.9  Lymphocyte #  0.7  Monocyte #  1.4  Eosinophil # 0.0  Basophil # 0.1 (Result(s) reported on 02 Feb 2014 at 05:51AM.)  04-May-15 05:02   WBC (CBC)  16.3  RBC (CBC)  3.66  Hemoglobin (CBC)  10.6  Hematocrit (CBC)  33.2  Platelet Count (CBC) 307  MCV 91  MCH 28.8  MCHC  31.8  RDW  16.6  Neutrophil % 81.1  Lymphocyte % 5.5  Monocyte % 12.5  Eosinophil % 0.6  Basophil % 0.3  Neutrophil #  13.2  Lymphocyte #  0.9  Monocyte #  2.0  Eosinophil # 0.1  Basophil # 0.0 (Result(s) reported on 06 Feb 2014 at 05:24AM.)   PERTINENT RADIOLOGY STUDIES: LabUnknown:    01-May-15 16:15, MRI Abdomen WWO  PACS Image   MRI:  MRI Abdomen WWO   REASON FOR EXAM:    liver mass  COMMENTS:       PROCEDURE: MR  - MR ABDOMEN WO/W CONTRAST  - Feb 03 2014  4:15PM     CLINICAL DATA:  Evaluate liver mass    EXAM:  MRI ABDOMEN WITHOUT AND WITH CONTRAST    TECHNIQUE:  Multiplanar multisequence MR imaging of the abdomen was performed  both before and after the administration of intravenous contrast.    CONTRAST:  20 cc of MultiHance  COMPARISON:  None.    FINDINGS:  Exam detail is diminished secondary to respiratory motion artifact.  There are smallbilateral pleural effusions.    There are areas of "loss of signal" on the out of phase images  within the right hepatic lobe compatible with focal fatty  deposition. No abnormal enhancing liver lesions identified. Stones  are identified within the gallbladder. These measure up to 6 mm.  There is no gallbladder wall thickening or pericholecystic fluid.  The common bile duct measures 9 mm. No  choledocholithiasis  identified. There is mild atrophy of the pancreatic parenchyma. The  spleen is unremarkable. There is no pancreatic mass identified. The  spleen is on unremarkable.  The right adrenal gland is normal. There is asymmetric enlargement  of the left adrenal gland. Bilateral renal cysts are noted.    The abdominal aorta has a normal caliber.  There is no aneurysm.    Normal signal throughout the bone marrow.     IMPRESSION:  1. Fatty deposition within the right hepatic lobe accounts for the  areas of low attenuation within the liver seen on recent CT scan. No  worrisome mass identified.  2. Gallstones.  3. Renal cysts  4. Asymmetric enlargement of the left adrenal gland.  Electronically Signed    By: Kerby Moors M.D.    On: 02/03/2014 17:09         Verified By: Angelita Ingles, M.D.,  CT:    28-Apr-15 17:22, CT Abdomen Pelvis WO for Stone  CT Abdomen Pelvis WO for Stone   REASON FOR EXAM:    left flank pain.  COMMENTS:       PROCEDURE: CT  - CT ABDOMEN /PELVIS WO (STONE)  - Jan 31 2014  5:22PM     CLINICAL DATA:  Flank pain.    EXAM:  CT ABDOMEN AND PELVIS WITHOUT CONTRAST    TECHNIQUE:  Multidetector CT imaging of the abdomen and pelvis was performed  following the standard protocol without IV contrast.    COMPARISON:  US RENAL KIDNEY dated 01/30/2014; CT ABD-PELV W/O CM  dated 02/23/2013    FINDINGS:  Ill-defined lucency noted throughout the liver. This could be  infectious or malignant and related to metastatic disease. Primary  hepatic tumor could also present in this fashion. MRI can be  obtained for further evaluation. The spleen is normal. Pancreas is  normal. Gallbladder may contain sludge and what appears to be a  small gallstone. No biliary distention.    Left adrenal enlargement noted consistent with adrenal hyperplasia.  Right adrenal is unremarkable. Bilateral renal enlargement,  particularly on the right noted. Bilateral  periureteral streaking  noted. Mild pelvocaliectasis noted. No obstructing ureteral stone is  definitely identified. These findings could be related to bilateral  pyelonephritis. Approximate 2 cm simple cyst anterior aspect  midportion right kidney. Probable hyper dense cyst measuring  approximately 8 mm posterior aspect right mid kidney. 2 cm complex  lesion left upper renal pole. Approximately 2 cm bilateral adnexal  cysts are noted. These most likely ovarian cysts. Calcifications in  uterus consistent with fibroid. No free pelvic fluid.    No significant adenopathy. Aorta normal caliber. Severe aortoiliac  atherosclerotic vascular disease. No significant hernia. Large  panniculus.    What appears to be the appendix is normal. Large amount stool is  noted throughout the colon particularly in the rectum with prominent  rectal and sigmoid colonic distention. These findings are consistent  with impaction and constipation. No evidence of small-bowel  obstruction. No free air. No mesenteric masses.    Bilateral pleural effusions. Basilar atelectasis. Mild cardiomegaly.  Coronary artery disease. Extensive lumbosacral degenerative change  and postsurgical change. Erosive endplate changes noted at multiple  lower thoracic vertebral disc spaces. Diskitis cannot be excluded.     IMPRESSION:  1. Diffuse severe ill-defined lucencies noted throughout the liver.  This could be infectious or malignant in etiology. This could be  related to primary or metastatic disease.  2. Gallstones with gallbladder sludge. No gallbladder or biliary  distention.  3. Bilateral renal enlargement particularly on the right. Perirenal  fat streaking and mild pelvocaliectasis without obstructing lesion  identified. These findings suggest bilateral pyelonephritis,  particular on the right.  4. 8 mm hyperdense cyst right kidney. 2 cm complex lesion left upper  renal pole.  5. Bilateral small ovarian cysts.   Calcified uterine fibroid.  6. Rectosigmoid fecal impaction with constipation.  7. Prominent bilateral pleural effusion with basilar atelectasis.  8. Coronary artery disease.  Cardiomegaly.  9. Erosive endplate changes noted over multiple lower thoracic  vertebral disc spaces. Diskitis cannot be excluded.      Electronically Signed    By: Marcello Moores  Register    On: 01/31/2014 17:40     Verified By: Osa Craver, M.D., MD   Pertinent Past History:  Pertinent Past History Obstructive sleep apnea, hypothyroidism, hypertension, diabetes, hyperlipidemia, diastolic congestive heart failure, ejection fraction 55%, COPD.   Hospital Course:  Hospital Course * Hypotension- Likely septic? may be due to Atelectasis / infiltrate    resolved now- much better. WBCs stable, BP stable.    Changed Abx to augmentin. will give total 5 days. * Digoxin toxicity with bradycardia, improved hold digoxin, resumed cardizem and Metoprolol,  Continue anticoagulation with Xarelto.started amiodarone per Dr. Rockey Situ. HR under control. xarelto was stopped fro Colonoscopy but as it is cancled- restarted. * Rib fracture: pain control, increased gabapentin dose. cont incentive spirometry. pain management by Dilaudid.   due to severe pain - added Fentanyl  patch. part of pain may also be due to constipation- on bowel regimen.   Called Palliative care for pain management. now on oxycontin + percocet much better today. * Hyperkalemia: improved. * Acute kidney injury due to ATN:  improved. * Urinary tract infection with pyelonephritis:  finished 7 days of rocephin. * Venous thromboembolism prophylaxis. cont Xarelto- stopped for colonoscopy- given lovenox meanwhile- now restarted. *  Liver mass- may be malignant? Elevated tumor markers.MRI is negative for any mass.     GI planning to do colonoscopy - insufficient prep- suggest to do as out pt. * constipation- on bowel regiemen- given relistor - no help, soap suds enema  and manual dis-impection - also had half gallon colonoscopy prep liquid- had 3 good bowel movements in last 24 hrs.  * h/o diastolic CHF: stable. decrease lasix to $Remove'20mg'FENvwoY$  daily due to low side BP. *   DM: SL. hold metformin. * COPD: stable.   Condition on Discharge Stable   Code Status:  Code Status Full Code   DISCHARGE INSTRUCTIONS HOME MEDS:  Medication Reconciliation: Patient's Home Medications at Discharge:     Medication Instructions  ondansetron 4 mg oral tablet, disintegrating  1 tab(s) orally every 6 hours nausea   aspirin 81 mg oral tablet  1 tab(s) orally once a day for atrial fib   latanoprost ophthalmic 0.005% ophthalmic solution  1 drop(s) to each eye once a day (at bedtime) for glaucoma   flomax 0.4 mg oral capsule  1 cap(s) orally once a day for urinary retention   protonix 40 mg oral delayed release tablet  1 tab(s) orally once a day in the afternoon for GERD   spiriva 18 mcg inhalation capsule  1 puff(s) inhaled once a day for COPD/respiratory failure   simvastatin 20 mg oral tablet  1 tab(s) orally once a day (at bedtime) for hypercholesterolemia   duloxetine 60 mg oral delayed release capsule  1 cap(s) orally 2 times a day for depression   albuterol 2.5 mg/3 ml (0.083%) inhalation solution  3 milliliter(s) inhaled every 4 hours, As Needed for copd/resp failure   duoneb 2.5 mg-0.5 mg/3 ml inhalation solution  3 milliliter(s) inhaled every 6 hours, As Needed for congestion   advair diskus 250 mcg-50 mcg inhalation powder  1 puff(s) inhaled 2 times a day for COPD   amiodarone 200 mg oral tablet  1 tab(s) orally once a day for a-fib   eucerin - topical cream  Apply topically to bilateral lower extremities once a day (at bedtime) for skin care   artificial tears preserved ophthalmic solution  1 drop(s) to each eye every 6 hours, As Needed for k-sicca   bupropion 150 mg/24 hours oral tablet, extended release  1 tab(s) orally once a day for depression   multivitamin  1  tab(s) orally once a day for supplement   requip 1 mg oral tablet  1 tab(s) orally 2 times a day   singulair 10 mg oral tablet  1 tab(s) orally once a day for allergic rhinitis   synthroid 200 mcg (0.2 mg) oral tablet  1 tab(s) orally once a day (in the morning) for hypothyroidism   trimethoprim 100 mg oral tablet  1 tab(s) orally once a day for chronic infection   vitamin d2 50,000 intl units oral capsule  1 cap(s) orally every 4 weeks (28 days) for nutritional supplement   xarelto 20 mg oral tablet  1 tab(s) orally once a day for anticoagulant   zanaflex 4 mg  oral capsule  1 cap(s) orally every 8 hours, As Needed for muscle spasms   acetaminophen-oxycodone 325 mg-5 mg oral tablet  1 tab(s) orally 1 to 4 times a day, As Needed, pain , As needed, pain   gabapentin 300 mg oral capsule  1 cap(s) orally 3 times a day   metoprolol tartrate 25 mg oral tablet  1 tab(s) orally once a day   furosemide 20 mg oral tablet  1 tab(s) orally once a day   oxycodone 15 mg oral tablet, extended release  1 tab(s) orally every 12 hours   docusate sodium 100 mg oral capsule  1 cap(s) orally 2 times a day , As needed, constipation   polyethylene glycol 3350 oral powder for reconstitution  17 gram(s) orally once a day, As Needed, constipation , As needed, constipation   amoxicillin-clavulanate 875 mg-125 mg oral tablet  1 tab(s) orally every 12 hours x 3 days    STOP TAKING THE FOLLOWING MEDICATION(S):    acetaminophen 325 mg oral tablet: 2 tab(s) orally every 4 hours, As Needed for pain  melatonin 1 mg oral tablet: 1 tab(s) orally once a day (at bedtime) for sleep imodium a-d 2 mg oral tablet: 1 tab(s) orally every 6 hours, As Needed - for Diarrhea magnesium oxide 400 mg oral tablet: 1 tab(s) orally once a day for supplement cholecalciferol 50,000 intl units oral capsule: 1 cap(s) orally once a month cardizem cd 120 mg/24 hours oral capsule, extended release: 1 cap(s) orally once a day for atrial  fibrillation biofreeze 0.2%-3.5% topical gel: Apply topically to both knees 3 times a day digoxin 125 mcg (0.125 mg) oral tablet: 1 tab(s) orally once a day (in the morning) for rate control glucophage 500 mg oral tablet: 0.5 tab(s) (250 mg) orally once a day (in the morning) for diabetes mellitus januvia 50 mg oral tablet: 1 tab(s) orally once a day for diabetes mellitus keflex 500 mg oral capsule: 1 cap(s) orally 4 times a day for 7 days for infection potassium chloride 20 meq oral tablet, extended release: 1 tab(s) orally once a day for supplement sodium chloride nasal - nasal spray: 1 spray(s) nasal once a day (at bedtime) for congestion spironolactone 25 mg oral tablet: 0.5 tab(s) (12.5 mg) orally once a day for diuretic tylenol 325 mg oral tablet: 2 tab(s) (650 mg) orally every 4 hours, As Needed - for Pain/fever vitamin c 500 mg oral tablet: 1 tab(s) orally 2 times a day for supplement  Physician's Instructions:  Home Oxygen? Yes   Oxygen delivery at home: 2L  Nasal Cannula   Diet Low Sodium  Low Fat, Low Cholesterol  Carbohydrate Controlled (ADA) Diet   Activity Limitations As tolerated   Return to Work Not Applicable   Time frame for Follow Up Appointment 1-2 weeks  Gi clinic.   Electronic Signatures: Vaughan Basta (MD)  (Signed [phone] 22:27)  Authored: ADMISSION DATE AND DIAGNOSIS, CHIEF COMPLAINT/HPI, Allergies, PERTINENT LABS, PERTINENT RADIOLOGY STUDIES, PERTINENT PAST HISTORY, HOSPITAL COURSE, DISCHARGE INSTRUCTIONS HOME MEDS, PATIENT INSTRUCTIONS   Last Updated: 10-May-15 22:27 by Vaughan Basta (MD)

## 2015-01-27 NOTE — Consult Note (Signed)
Apparently, patient has been sipping bowel prep all night and this AM even though patient SHOULD HAVE BEEN NPO after MN.. Only drank 1/2 of prep. Pt not cleaned out. Therefore, we cannot do the colonoscopy today. I am not available either Tues or Wed. Will sign off. Make sure patient is on daily laxatives to help her with constipation bowel prep. Pt can f/u in office for follow up. Can resume lovenox. Thank you.    Electronic Signatures: Lutricia Feilh, Messiah Ahr (MD) (Signed on 04-May-15 10:36)  Authored   Last Updated: 04-May-15 10:40 by Lutricia Feilh, Jaskiran Pata (MD)

## 2015-01-27 NOTE — Consult Note (Signed)
74 y/o Morbid Obesity (BMI=41.7) with long standing hisxtory of chronic pain. History is significant for chronic Low Back Pain and Left Lower Extremity pain, recently (2wks ago) exacerbated by fall. She has Osteoarthrosis with known right total knee replacement and back Surgery. X-rays show possible chronic left rib fractures of 7th, 8th & 9th rib, however, she did not complain to me about this left flank pain. She indicates having Diabetic peripheral neuropathy and chronic abdominal pain, as well as the above mentioned Left sided Low Back Pain and Hip Pain. She takes Percocet 5/325 1 tab PO TID, every day. She indicates that it does not eliminate the pain, but does take the edge off of it. Complicating factors to interventional pain management include anticoagulant therapy with Xarelto, and diabetes, which also can be a problem with oral or injectable steroids. Complicating factors for analgesic pahrmacotherapy include renal insuficiency (avoid NSAIDS), Sleep apnea (high risk of respiratory depression with opioids), and DM (hyperglycemia with steroids), among other things. At the time of my evaluation, she indicated her pain to be under control and her current regimen to be adequate, except that the meds wore off after 3 hours. A current problem with her regimen also seems to be the apparent opioid-induced constipation. Her primary complaint seems to be Left sided Low Back Pain and hip pain, since the fall. She denies any x-rays of the area.  Pain Assessment:Acute on Chronic Left sided Low Back Pain status post fall 2 weeks ago.Chronic Low Back Pain/Lumbago (724.2/M54.5)Lumbosacral Osteoarthritic Arthropathy/SpondylosisLumbar Degenerative Disc Disease (722.52/M51.36)Secondary Myofascial Pain Syndrome (729.1/M60.9)Left Hip PainLeft Hip Joint Degenerative Joint DiseaseLeft Hip ArthropathyLeft Hip Osteoarthritis  Plan: 1. X-rays of Lumbar and Thoracic Spine, Sacroiliac Joint, and Hip Joint.Continue current  analgesic medication regiment.Regular outpatient referral to Pain Clinic.Consider Relistor 12mg /0.286ml [place] qd for treatment of opioid-induced constipation, as long as there is no mechanical bowel obstruction. Lower dose by 1/2, to 6mg /0.773ml if CrCl is less than 30 ml/min. Long-term recomendations: Bring BMI down below 30.   Electronic Signatures: [name], [name] (MD) (Signed on 28-Apr-15 20:49)  Authored   Last Updated: 29-Apr-15 07:50 by [name], [name] (MD)

## 2015-01-27 NOTE — Consult Note (Signed)
PATIENT NAME:  [name], [name] MR#:  [id number] DATE OF BIRTH:  1941/09/10  DATE OF CONSULTATION:  02/02/2014  ATTENDING PHYSICIAN:  Dr. Imogene Burn  CONSULTING PHYSICIAN:  Rodman Key, NP  [name] is a 74 year old Caucasian female who has a significant past medical history of hypertension, diabetes mellitus, hyperlipidemia, congestive heart failure with an ejection fraction of 55%. She is known to me, as well as Dr. Bluford Kaufmann. She presented to [facility] Emergency Room with a complaint of chest pain and lower back pain. According to patient, she is a nursing home resident and has been for the past 3 years. States that a month ago that she fell, she, unfortunately, lacerated her toes, required sutures. She is pretty much bed bound or in a wheelchair and has been for a while. She states that since this fall she has been experiencing left-sided abdominal pain radiating around to her back. She does suffer with constipation as the reason in which she has been seen in our office. Unless she receives laxatives or actually gets disimpacted, her bowels do not move on a regular basis. Her last bowel movement was yesterday. She states that she was disimpacted but that did not seem to help. Some nausea. No vomiting. History of reflux, not well controlled and does experience heartburn.  No rectal bleeding.  No melena.  She feels she has had some weight loss, but she is unable to elaborate the amount. No difficulty voiding.  GI consultation was requested for the concern of findings on CT scan of abdomen and pelvis, which was without contrast on the 28th, was done in followup after renal ultrasound. CT scan findings:  An ill-defined lucency noted throughout the liver, possibly infectious versus malignant. Primary hepatic tumor could also present in this fashion. MRI should be considered. Spleen is normal. Pancreas is normal. Gallbladder may contain sludge. What appears to be a small gallstone. No biliary distention.  Left adrenal  enhancement is noted consistent with adrenal hyperplasia. Right adrenal is unremarkable. Bilateral renal enhancement, particularly on the right. Bilateral periureteral streaking is noted. She does have a 2 cm simple cyst of right kidney. Also has a 2 cm complex lesion in the renal pole renal pole, approximately 2 cm bilateral adnexal cysts are noted as well. Appears most likely ovarian cyst. Calcifications in the uterus is consistent with fibroids. No free pelvic fluid. Appendix appears to be normal. There is a large amount of stool that is noted throughout the colon, particularly in the rectum with prominent rectal and sigmoid colonic distention consistent with impaction and constipation. Bilateral pleural effusions are noted, as well as mild cardiomegaly.   The patient did have a CT scan of abdomen and pelvis with contrast and it was done on 06/02/2014. The liver exhibits no focal mass nor ductal dilatation. Again, constipation was noted and there subjective hazy enhancement of both kidneys. I would put it did not appear that the CT scan which was done on 01/31/2014 was compared to the CT scan which was done August 28. It looks like it was only compared to 02/23/2013.   PAST MEDICAL HISTORY: Hypertension, diabetes mellitus, hyperlipidemia, congestive heart failure, chronic constipation, deep venous thrombosis, MI, alcohol abuse, depression, bronchitis, asthma.  PAST SURGICAL HISTORY: Bone graft 2012, thyroidectomy, back surgery, knee surgery, right knee replacement, back surgery again, and tonsillectomy.    FAMILY HISTORY: Father stomach cancer, stroke.   SOCIAL HISTORY: Remote tobacco use, history of alcohol abuse, currently a resident at nursing home.  REVIEW OF  SYSTEMS:  CONSTITUTIONAL: Denies any unusual headaches, fevers, chills.  EYES: No blurred vision, double vision.  EARS, NOSE, THROAT: No epistaxis, hearing loss.  PULMONARY: Significant for cough, shortness of breath.  CARDIOVASCULAR: No  chest pain, but shortness of breath with exertion.  GASTROINTESTINAL: See HPI.  GENITOURINARY: Denies any hematuria, dysuria.  ENDOCRINE: No heat or cold intolerance.  MUSCULOSKELETAL: Chronic back pain.  SKIN: No lesions. No rashes.  NEUROLOGICAL: Significant for generalized weakness. No history of CVA or TIA.  PSYCHIATRIC: Significant for depression.   PHYSICAL EXAMINATION: VITAL SIGNS: Temperature is 98.3 with a pulse of 75, respirations 18, blood pressure 97/63 with a pulse ox of 95% on room air.  GENERAL: Well-developed, well-nourished 74 year old Caucasian female, no acute distress but does appear kind of uncomfortable and lying on her side, probably in the fact that she has a decubitus ulcer.  HEENT: Normocephalic, atraumatic. Pupils equal and reactive to light. Conjunctivae clear. Sclerae anicteric.  NECK: Supple. Trachea midline. No lymphadenopathy or thyromegaly.  PULMONARY: Symmetric rise and fall of chest. Clear to auscultation anteriorly.  CARDIOVASCULAR: Regular rhythm, S1, S2. No murmurs, no gallops.  ABDOMEN: Large, distended. Bowel sounds in 4 quadrants. Difficult to truly assess evidence of hepatosplenomegaly given body habitus size of involving abdomen.  RECTAL: Deferred.  MUSCULOSKELETAL: No contractures, clubbing. EXTREMITIES: No edema.  PSYCHIATRIC: Alert and oriented x 4. Memory grossly intact.  NEUROLOGICAL: No gross neurological deficits.   LABORATORY AND DIAGNOSTICS: All laboratory/diagnostic studies were reviewed with myself during her hospitalization. CT scan findings do not correlate with the finding of LFTs being elevated. Albumin is low at 3.1. Digoxin level elevated at 2.9. Indication for admission: Digoxin toxicity. White count has remained elevated during hospitalization between a range of 16.1 to 12.0. Alpha-fetoprotein tumor marker is 1.6, which is within normal range.  Renal ultrasound revealed bilateral renal cysts arising from the upper pole of the  right kidney as noted on CT scan.  Chest:  She has numerous x-rays which have been done during her hospitalization. Chest, PA and lateral the 28th, suggested interval development of congestive heart failure with interstitial edema and bilateral pulmonary effusions.   IMPRESSION: 1.  Digoxin toxicity. 2.  Abnormal gastrointestinal x-ray finding, possibly concerning for hepatic malignancy, renal cysts.  3.  Hypertension.  4.  Obesity. 5.  Chronic constipation.   PLAN: The patient's presentation will be discussed with Dr. Lutricia FeilPaul Oh. Addendum to follow with care plan recommendations.  These services provided by Rodman Keyawn S. Harrison, MS, APRN, [facility], FNP under collaborative agreement with Lutricia FeilPaul Oh, M.D.     ____________________________ Rodman Keyawn S. Harrison, NP dsh:ce D: 02/02/2014 14:51:26 ET T: 02/02/2014 17:22:32 ET JOB#: [id number]  cc: Rodman Keyawn S. Harrison, NP, <Dictator> Rodman KeyAWN S HARRISON MD ELECTRONICALLY SIGNED 02/09/2014 7:41

## 2015-01-27 NOTE — H&P (Signed)
PATIENT NAME:  [name], [name] MR#:  [id number] DATE OF BIRTH:  08/03/1941  DATE OF ADMISSION:  06/02/2014  PRIMARY CARE PHYSICIAN:  Traci Hashimoto, MD, at [facility].   CHIEF COMPLAINT: Increasing lethargy, weakness, and fever of 102.8.   HISTORY OF PRESENT ILLNESS: [name] history is obtained from ER physician, EMS notes and old records. The patient currently is intubated on the ventilator unable to give any history or review of systems. [name] is a 74 year old morbidly obese Caucasian female with past medical history of hypertension, diabetes, atrial fibrillation on Xarelto; chronic diastolic congestive heart failure, morbid obesity, and UTIs in the past, comes to the Emergency Room after she was found to have temperature of 102.8. She was started on Rocephin, Cipro and Flagyl at [facility] today. The patient was lethargic requiring oxygen and her sats were 85% to 92% on 3 liters. She was very weak and having shortness of breath, was brought to the Emergency Room, where she was put on BiPAP initially in the Emergency Room. Her condition continued to worsen to the point she became more lethargic and was unable to recognize anybody.  Given her increased work of breathing and fatigability with increased work of breathing, patient got intubated and placed on a ventilator. She is going to be receiving vancomycin and Zosyn. She received a dose of IV Solu-Medrol 125 mg in the Emergency.   PAST MEDICAL HISTORY:   1.  Chronic obstructive pulmonary disease/asthma.  2.  Obstructive sleep apnea.  3.  Hypothyroidism.  4.  Hypertension.  5.  Diabetes.  6.  Hyperlipidemia.  7.  Diastolic congestive heart failure with EF of 55% on echocardiogram in May 2015.  8.  COPD.  9.  History of tobacco abuse.  10.  The patient is currently wheelchair-bound for ambulation.  11.  Morbid obesity.  12.  Coronary artery disease.  13.  Paroxysmal atrial flutter/fibrillation on p.o. Xarelto.   ALLERGIES: INCLUDE  LISINOPRIL, AMIODARONE.   FAMILY HISTORY: Positive for hypertension and diabetes.   REVIEW OF SYSTEMS:  Not obtainable.   SOCIAL HISTORY: She is a resident at [facility] and there is history of smoking listed in her old records.   MEDICATIONS: Are:  1.  Advair 250/50 one puff b.i.d.  2.  Albuterol inhaler daily as needed every 4 hours.  3.  Amiodarone 200 mg daily.  4.  Artificial tears 1 drop to each eye every 6 hours.  5.  Aspirin 81 mg daily.  6.  Benadryl 25 mg every 6 hours as needed.  7.  Bupropion 150 mg extended release p.o. daily.  8.  Cipro 500 mg p.o. b.i.d. that was started 06/02/2014.  9.  Amoxicillin 500 mg 1 tablet 3 times a day for 10 days that was started 06/02/2014, reason unknown.  10.  Duloxetine 60 mg b.i.d.  11.  Duo Nebs 3 mL every 6 hours as needed.  12.  Eucerin topical cream apply to affected area.  13.  Flomax 0.4 mg p.o. daily.  14.  Lasix 20 mg daily.  15.  Glucophage 500 mg 1/2 tablet daily.  16.  Guaifenesin 400 mg every 8 hours as needed.  17.  Januvia 50 mg p.o. daily.  18.  Melatonin 1 mg p.o. at bedtime.  19.  Metoprolol tartrate 25 mg p.o. daily.  20.  MiraLax 17 grams every 24 hours as needed.  21.  Multivitamin p.o. daily.  22.  Neurontin 100 mg p.o. daily.  23.  Novolin R  subcutaneous on Monday, Wednesday and Friday per sliding scale.  24.  Protonix 40 mg p.o. daily.  25.  Oxycodone 5 mg every 4 hours as needed.  26.  K-Dur 20 mEq p.o. b.i.d.  27.  Requip 1 mg p.o. b.i.d.  28.  Rocephin 2 grams x 1 intramuscularly given on 06/02/2014.  29.  Simvastatin 20 mg daily at bedtime.  30.  Singulair 10 mg daily.  31.  Spiriva 1 puff inhalation daily.  32.  Synthroid 200 mcg p.o. daily.  33.  Trimethoprim 100 mg p.o. daily for chronic urinary tract infections.   34.  Tylenol 325 mg 2 tablets every 6 hours as needed.  35.  Vitamin C 500 mg b.i.d.  36.  Vitamin D2 of 50,000 international units p.o. once a month.  37.  Xarelto 20 mg daily.   38.  Zanaflex 4 mg every 8 hours as needed for muscle spasms.   PHYSICAL EXAMINATION: VITAL SIGNS: Her pulse is 101, last blood pressure recorded in the right arm was 135/57.  GENERAL: The patient is currently intubated on the vent, her sats are more than 92% on current vent setting; she is morbidly obese.  HEENT: Pupils sluggishly reacting to light. Oral mucosa is moist. Endotracheal tube is present.  NECK: Supple. No JVD. No carotid bruit.  RESPIRATORY: Distant breath sounds. No rales, rhonchi, respiratory distress or labored breathing.  CARDIOVASCULAR:  Both the heart sounds are normal.  ABDOMEN: Obese, soft, nontender. No organomegaly, distended, secondary to obesity.  EXTREMITIES: Very feeble pedal pulses present, cannot appreciate femoral pulses secondary to severe obesity; the patient has chronic venous stasis changes in her legs bilaterally.  NEUROLOGIC: The patient is intubated on the vent.  SKIN: Warm and dry.   DIAGNOSTIC DATA:  Chest x-ray shows fluid in the right minor fissure, partially visible lower spinal fusion and hardware. No consultation identified. No pneumothorax. PH is 7.25, pCO2 is 43, pO2 of 104, with bicarbonate of 18.9, saturations of 96%, on current ventilator settings. The patient has lactic acid of 2.1, white count of 5.4, H and H is 12.2 and 38.5; creatinine is 1.69, sodium is 134, glucose is 150, BUN is 30, B-type natriuretic peptide is 2093, troponin is 0.02, magnesium is 1.4.   ASSESSMENT AND PLAN: A 74 year old [name] with history of asthma, chronic obstructive pulmonary disease, obstructive sleep apnea, hypertension, diabetes, comes in with:  1.  Sepsis. Source could be respiratory versus urinary tract infection. Patient has history of recurrent urinary tract infection as well. She had temperature of 102.8, with hypotension in the Emergency Room initially and was initially somewhat tachycardic. The patient is intubated on the ventilator. We will cover  with broad-spectrum antibiotics with vancomycin and Zosyn; pulmonary consultation with [name] for ventilator management; follow blood cultures, sputum cultures and urine cultures. Infectious disease consultation if needed.  2.  Acute hypoxic respiratory failure secondary to chronic obstructive pulmonary disease exacerbation versus congestive heart failure diastolic. Patient does not appear to be in heart failure at this time. She had decreased air entry, it seems like more so of chronic obstructive pulmonary disease exacerbation. We will continue, patient is already on the ventilator, we will give Ventolin oral puffs through the ventilator per protocol; [name] to help manage ventilator. Continue Solu-Medrol around the clock, and follow clinically, and wean the ventilator as tolerated.  3.  Acute renal failure. Creatinine of 1.69, we will continue intravenous fluids for hydration. Monitor ins and outs and creatinine closely.  4.  Morbid obesity with obstructive sleep apnea and oxygen at the facility. We will continue ventilator for now and monitor clinical progress.  5.  History of coronary artery disease status post cardiac stents in the past. We will give her aspirin per rectally daily.   6.   Deep venous thrombosis prophylaxis, subcutaneous heparin t.i.d.  7.  Hypomagnesemia, replete intravenously with intravenous magnesium.  8.  Gastrointestinal prophylaxis. We will give her Zantac intravenous b.i.d.  9.  this was discussed with the patient's daughter Jeronimo GreavesDebbie Adams, the critical nature of illness was explained to her. She understands. The patient is a full code at present.   CRITICAL TIME SPENT:  Was 50 minutes.    ____________________________ Wylie HailSona A. Allena KatzPatel, MD sap:nt D: 06/02/2014 19:12:56 ET T: 06/02/2014 20:07:11 ET JOB#: [id number]  cc: Alba Kriesel A. Allena KatzPatel, MD, <Dictator> Leo GrosserNancy J. Maloney, MD Willow OraSONA A Jamarian Jacinto MD ELECTRONICALLY SIGNED 06/13/2014 14:56

## 2015-01-27 NOTE — Discharge Summary (Signed)
PATIENT NAME:  [name], [name] MR#:  [id number] DATE OF BIRTH:  1940-11-09  DATE OF ADMISSION:  06/02/2014 DATE OF DISCHARGE:  06/08/2014   PRIMARY CARE PHYSICIAN: Leo Grosser, MD   DISCHARGE DIAGNOSES:  1. Sepsis and septic shock.  2. Urinary tract infection, extended-spectrum beta-lactamase Escherichia coli.  3. Acute hypoxic respiratory failure secondary to chronic obstructive pulmonary disease exacerbation.  4. Acute on chronic diastolic congestive heart failure.  5. Acute renal failure.  6. Coronary artery disease.  7. Paroxysmal atrial fibrillation. 8. Anemia.  9. Morbid obesity with obstructive sleep apnea.   CONDITION: Stable.   CODE STATUS: Full code.   HOME MEDICATIONS: Please refer to the medication reconciliation list. The patient will be given Augmentin 875 mg/125 mg oral tablets 1 tablet q.12 hours for 5 days.   DIET: Low-sodium, low-cholesterol, ADA diet.   ACTIVITY: As tolerated.   FOLLOWUP CARE: Follow up with PCP within 1 to 2 weeks. Follow up with Dr. Sampson Goon within 1 to 2 weeks.   REASON FOR ADMISSION: Increasing lethargy, weakness and a fever of 102.8.   HOSPITAL COURSE: The patient is a 74 year old Caucasian female with multiple medical history, who was sent from [facility] to ED due to increasing lethargy, weakness and fever. The patient's temperature was 102.8 and was treated with antibiotics in [facility], but the patient's O2 saturation dropped to 85% to 92% on 3 liters. The patient was very weak and had shortness of breath and was brought to ED. She was put on BiPAP initially, but since her condition was worsening, the patient was intubated. The patient got vancomycin, Zosyn and Solu-Medrol in ED. For detailed history and physical examination, please refer to the admission note dictated by Dr. Enedina Finner. On admission date, the patient's chest x-ray showed fluid in the right minor fissure. No consolidation, no pneumothorax. The  pH of 7.25, pCO2 of 43, pO2 of 104, bicarbonate 18.9. WBC 5.4, hemoglobin 12.2. Creatinine 1.69, sodium 134, BUN 30 and magnesium 1.4. Troponin less than 0.02.   1. Sepsis and septic shock. The patient was admitted to CCU, and after admission, the patient developed hypotension and tachycardia. The patient was treated with vancomycin and Zosyn for sepsis and septic shock and UTI. Since the patient's urinalysis showed UTI and urine culture showed ESBL and E. coli, Dr. Sampson Goon suggested to change antibiotics to meropenem IV. He suggested to change to p.o. Augmentin until September 7th after discharge. The patient still has leukocytosis at 16, which is possibly due to steroid.  2. Acute hypoxic respiratory failure secondary to chronic obstructive pulmonary disease exacerbation. The patient was initially treated with Solu-Medrol and then changed to prednisone with nebulizer treatment. The patient was extubated and transferred to the floor. The patient's symptoms have much improved.  Respiratory failure improved.  3. Acute on chronic diastolic congestive heart failure possibly due to fluid overload. Since the patient had septic shock, the patient was treated with IV fluid support. However, the patient developed shortness of breath. Chest x-ray showed congestive heart failure, mild edema, so the patient has been treated with Lasix 20 mg p.o. daily. Symptoms have much improved. Lung sounds are clear now. No leg edema.  4. Acute renal failure, possibly due to septic shock and sepsis causing acute tubular necrosis. The patient's renal function has much improved after IV fluid support. Creatinine decreased to normal range.  5. For history of coronary artery disease, morbid obesity, the patient has been treated with aspirin. 6. Diabetes.  The patient was treated with sliding scale. Blood sugar is controlled.  7. Anemia is stable.  8. The patient has a history of paroxysmal atrial fibrillation and was continued with  Xarelto and amiodarone.   The patient has no complaints. Her vital signs are stable. She is clinically stable and will be discharged to nursing home today.   I discussed the patient's discharge plan with the patient, nurse, case Production designer, theatre/television/filmmanager and Child psychotherapistsocial worker.   TIME SPENT: About 43 minutes.   ____________________________ Shaune PollackQing Delana Manganello, MD qc:lb D: 06/08/2014 11:23:48 ET T: 06/08/2014 11:43:30 ET JOB#: [id number]  cc: Shaune PollackQing Dante Roudebush, MD, <Dictator> Shaune PollackQING Mario Coronado MD ELECTRONICALLY SIGNED 06/08/2014 14:16

## 2015-01-27 NOTE — Consult Note (Signed)
   General Aspect deleted document. See dictated note.    Lisinopril: Other  amiodarone: Unknown  Electronic Signatures: Lorine BearsArida, Davaughn Hillyard (MD)  (Signed 27-Apr-15 09:16)  Authored: General Aspect/Present Illness, Home Medications, Allergies   Last Updated: 27-Apr-15 09:16 by Lorine BearsArida, Hatsumi Steinhart (MD)

## 2015-01-27 NOTE — Consult Note (Signed)
Pt seen and examined. Please see Dawn Harrison's notes. Abnormal CT of liver WITHOUT contrast. Previous CT of liver WITH contrast was normal. AFP normal. CEA pending. Pt had colonoscopy on 9/14. Had multiple polyps that were removed. No evidence of colon cancer with liver mets. If truly concerned about liver cancer, would repeat CT or MRI with contrast. LIver bx would be definitive but patient will need to hold xarelto before that can be arranged. Thanks.  Electronic Signatures: Lutricia Feilh, Chenille Toor (MD)  (Signed on 30-Apr-15 16:12)  Authored  Last Updated: 30-Apr-15 16:12 by Lutricia Feilh, Deveron Shamoon (MD)

## 2015-01-27 NOTE — Op Note (Signed)
PATIENT NAME:  [name], [name] MR#:  [id number] DATE OF BIRTH:  [birth year]  DATE OF PROCEDURE:  12/09/2013  PREOPERATIVE DIAGNOSIS: Hematuria.   POSTOPERATIVE DIAGNOSIS: Hematuria, with chronic infection and either chronic cystitis from indwelling Foley or bladder tumor.   PROCEDURE:  Cystoscopy with bladder biopsy and fulguration.   SURGEON: Richard D. Edwyna ShellHart, MD    ANESTHESIA: General.   COMPLICATIONS: None.   DESCRIPTION OF PROCEDURE: With patient sterilely prepped and draped in the supine lithotomy position, after removal of her Foley, we do [name] timeout. Once the timeout has been acknowledged by all the medical personnel in the room, we began the procedure. I inserted [name] rigid cystoscope in the bladder, 22 [place]French, with 4 oblique lens. The bladder is seen in all quadrants. The right posterolateral wall of the area appears to be either catheter damage or an early bladder tumor. This was biopsied with [name] Lowsley forceps. The area that has been biopsied deep in the muscle is fulgurated, and the patient has bladder emptied. There is no bleeding. She was sent to recovery with [name] 20 French Foley catheter in place and [name] B and O suppository in place. The B and O suppository is in the rectum. Then 30 mL of 0.5% Marcaine had been placed in the bladder after the Foley was placed. Then the left plugged for [name] few minutes to help anesthetize the bladder for postoperative comfort.    ____________________________ Caralyn Guileichard D. Edwyna ShellHart, DO rdh:mr D: 12/09/2013 15:00:34 ET T: 12/09/2013 22:41:28 ET JOB#: [id number]  cc: Caralyn Guileichard D. Edwyna ShellHart, DO, <Dictator> RICHARD D HART DO ELECTRONICALLY SIGNED 12/22/2013 17:33

## 2015-01-27 NOTE — Consult Note (Signed)
Brief Consult Note: Diagnosis: POSSIBLE LIVER MASS.   Comments: PATIENT SEEN, CHART REVIEWED, CT NON CONTRAST NON SPECIFIC, BUT CONCERNING FOR HCC OR METASTATIC CANCER. TUMOR MARKERS PENDING. CONSTIPATION MAY BE PHYSIOLOGIC BUT ALSO RAISES CONCERN RE GI MALIGNANCY. HGB BORDERLINE, LOW NORMAL BUT  UNREMARKABLE IN CHRONIC DISEASE, DIABETES, AND ACUTE INFECTION. IRON STUDIES WOULD BE MISLEADING DURING ACUTE INFECTION. LFTS NORMAL. CRE NOW DOWN TO NORMAL. SPEP AND UPEP WERE NORMAL    SUGGEST, REVIEW TUMOR MARKERS,  IF CEA HIGH AND AFP NORMAL, THEN COULD PROCEED TO PET SCAN WITH DX OF LIKELY METASTATIC GI/COLON CANCER,  OTHERWISE  MRI WITHOUT AND WITH CONTRAST LIVER .  Electronic Signatures: Gittin, RoberMarin Robertst G (MD)  (Signed 29-Apr-15 19:18)  Authored: Brief Consult Note   Last Updated: 29-Apr-15 19:18 by Marin RobertsGittin, Robert G (MD)

## 2015-01-27 NOTE — Consult Note (Signed)
Chief Complaint:  Subjective/Chief Complaint Patient continued abd pain that she says is worse with movement. She also says her neck is stiff.   VITAL SIGNS/ANCILLARY NOTES: **Vital Signs.:   03-May-15 04:55  Respirations Respirations 20    06:22  Vital Signs Type Recheck  Pulse Pulse 70  Systolic BP Systolic BP 86  Diastolic BP (mmHg) Diastolic BP (mmHg) 56  Mean BP 66  Pulse Ox % Pulse Ox % 94  Oxygen Delivery 2L   Brief Assessment:  GEN well developed, well nourished, no acute distress, obese   Respiratory normal resp effort   Gastrointestinal Normal   Gastrointestinal details normal Soft   Additional Physical Exam Alert and orientated times 3   Lab Results: Routine Hem:  01-May-15 05:02   WBC (CBC)  14.0  RBC (CBC)  3.78  Hemoglobin (CBC)  11.0  Hematocrit (CBC)  34.3  Platelet Count (CBC) 281  MCV 91  MCH 29.0  MCHC 32.0  RDW  16.6  Neutrophil % 82.2  Lymphocyte % 5.0  Monocyte % 11.7  Eosinophil % 0.6  Basophil % 0.5  Neutrophil #  11.5  Lymphocyte #  0.7  Monocyte #  1.6  Eosinophil # 0.1  Basophil # 0.1 (Result(s) reported on 03 Feb 2014 at 05:36AM.)   Assessment/Plan:  Assessment/Plan:  Assessment IDA.   Plan Patient for colonoscopy tomorrow by Dr. Bluford Kaufmannh. Nothing new to add. Dr. Bluford Kaufmannh to resume care tomorrow.   Electronic Signatures: Midge MiniumWohl, Alaysha Jefcoat (MD)  (Signed [phone]-May-15 07:37)  Authored: Chief Complaint, VITAL SIGNS/ANCILLARY NOTES, Brief Assessment, Lab Results, Assessment/Plan   Last Updated: 03-May-15 07:37 by Midge MiniumWohl, Rochester Serpe (MD)

## 2015-01-27 NOTE — H&P (Signed)
PATIENT NAME:  Traci [name], Traci [name] MR#:  [id number] DATE OF BIRTH:  01-Aug-1941  DATE OF ADMISSION:  01/29/2014  REFERRING PHYSICIAN: Dr. Zenda AlpersWebster  PRIMARY CARE PHYSICIAN: Dr. Elease HashimotoMaloney   CHIEF COMPLAINT: Rib pain.   HISTORY OF PRESENT ILLNESS: [name] 74 year old Caucasian female with [name] history of  hypertension, diabetes, hyperlipidemia, congestive heart failure, ejection fraction of 55%, presenting with rib pain. She describes one week's duration of left-sided chest pain, mid-axillary line, at the bottom of ribs. She described the pain as sharp, eight out of 10 in intensity, worse with movements as well as deep breathing. No relieving factors. She had [name] fall out of her bed preceding her symptoms. She denies any other symptoms including chest pain, palpitations, or shortness of breath.   REVIEW OF SYSTEMS:  CONSTITUTIONAL: Denies fever, fatigue, weakness.  EYES: Denies blurred vision, double vision, eye pain.  EARS, NOSE, THROAT: Denies tinnitus, ear pain, hearing loss.  RESPIRATORY: Denies cough, wheeze, shortness of breath.  CARDIOVASCULAR: Denies chest pain, palpitations, edema.  GASTROINTESTINAL: Denies nausea, vomiting, diarrhea, abdominal pain.  GENITOURINARY: Denies dysuria, hematuria.  ENDOCRINE: Denies nocturia or thyroid problems.  HEME/LYMPH: Denies easy bruising or bleeding.  SKIN: Denies rash or lesion.  MUSCULOSKELETAL: Denies pain in neck, back, shoulder, knees, hips or arthritic symptoms.  NEUROLOGIC: Denies paralysis, paresthesias.  PSYCHIATRIC: Denies anxiety or depressive symptoms.  Otherwise, full review of systems performed by me is negative.   PAST MEDICAL HISTORY: Obstructive sleep apnea, hypothyroidism, hypertension, diabetes, hyperlipidemia, diastolic congestive heart failure, ejection fraction 55%, COPD.    SOCIAL HISTORY: Current, every-day tobacco use. Remote history of alcohol usage. Denies any drug usage. Currently he is in [name] wheelchair for ambulation.   FAMILY HISTORY:  Denies any known cardiovascular or pulmonary illnesses.   ALLERGIES: AMIODARONE AND LISINOPRIL.   HOME MEDICATIONS: Include: Acetaminophen 325 mg 2 tablets every 4 hours as needed for pain, aspirin 81 mg p.o. daily, Percocet 5/325 mg every 6 hours as needed for pain, Flomax 0.4 mg p.o. daily, Cardizem 120 mg p.o. daily, digoxin 125 mcg daily, Xarelto 20 mg p.o. daily, Neurontin 100 mg p.o. daily, duloxetine 60 mg p.o. b.i.d., Glucophage 500 mg 1/2 tablet p.o. daily, simvastatin 20 mg p.o. at bedtime, Requip 1 mg p.o. b.i.d., Lopressor 25 mg p.o. b.i.d., Advair 250/50 mcg inhalation 1 puff b.i.d., albuterol 0.083% solution 3 mL every 4 hours as needed for shortness of breath, Spiriva 18 mcg inhalation 1 puff daily, Lasix 40 mg p.o. daily, Singulair 10 mg p.o. daily, magnesium oxide 400 mg p.o. daily, melatonin 1 mg p.o. at bedtime, FreshKote ophthalmic solution 1 drop to each eye 3 times daily, Latanoprost ophthalmic solution 1 drop to each eye at nighttime, Systane ophthalmic solution 1 drop to each eye b.i.d., probiotics once daily, Protonix 40 mg p.o. daily, bupropion 150 mg p.o. daily, Synthroid 200 mcg p.o. daily with cholecalciferol 50,000 international units p.o. monthly.   PHYSICAL EXAMINATION: VITAL SIGNS: Temperature 98.3, heart rate 40, respirations 20, blood pressure 114/56, saturating 92% on room air. Weight 117.9 kg, BMI 42.  GENERAL: Well-nourished, obese Caucasian female, currently in no acute distress.  HEAD: Normocephalic, atraumatic. Eyes: Pupils equal, round, and reactive to light. Extraocular movements intact. No scleral icterus. Mouth: Dry mucosal membranes. Poor dentition. No abscess noted. Ears/nose/throat: Clear without exudates. No external lesions.  NECK: Supple. No thyromegaly. No nodules. No JVD.  PULMONARY: Clear to auscultation bilaterally without wheezes, rubs, or rhonchi. No use of accessory muscles. Good respiratory effort.  CHEST: Nontender to palpation.  CARDIOVASCULAR: S1, S2, bradycardic without murmurs, rubs, or gallops. No edema. Pedal pulses 2+ bilaterally.  GASTROINTESTINAL: Soft, nontender, nondistended. No masses. Positive bowel sounds. No hepatosplenomegaly.  MUSCULOSKELETAL: No swelling, clubbing, edema. Range of motion full in all extremities.  NEUROLOGIC: Cranial nerves II through XII intact. No gross neurologic deficits. Sensation intact. Reflexes intact. SKIN: No ulcerations, lesions, rash, cyanosis. Skin warm and dry. Turgor intact.  PSYCHIATRIC: Mood and affect within normal limits. The patient is awake, alert, and oriented x3. Insight and judgment intact.   LABORATORY, DIAGNOSTIC AND RADIOLOGIC DATA: Left rib x-ray reveals mildly displaced fractures of the left 7th, 8th, and 9th ribs. They appear somewhat subacute. Remainder of laboratory data: Sodium 133, potassium 5.4, chloride 101, bicarbonate 25, BUN 62, creatinine 2.66, glucose 170, digoxin 2.9. WBC 12, hemoglobin 11.7, platelets 292,000. Urinalysis: WBCs 26, RBCs 14, leukocyte esterase 1+, nitrite negative, epithelial cells 1.   ASSESSMENT AND PLAN: [name] 74 year old Caucasian female presenting with rib pain.  1. Digoxin toxicity. She is bradycardic but symptomatic. On EKG, she has [name] junctional rhythm, heart rate in the 40s, though has somewhat improved during her time in the emergency room. Will place on telemetry, hold further digoxin, hold AV nodal agents. No indication for Digibind at this time, which would include hypotension or symptomatic arrhythmias.  2. Rib fracture. Provide pain control. Add [name] bowel regimen. Add incentive spirometry.  3. Hyperkalemia of 5.5 in the setting of acute kidney injury. Follow renal function as well as potassium levels after some IV fluid hydration.  4. Acute kidney injury. IV fluid hydration. Will also check [name] bladder scan.  5. Urinary tract infection. Antibiotic coverage with ciprofloxacin as provided in the Emergency Department. Continue this.  Follow culture data.  6. Venous thromboembolism prophylaxis. She is currently on Xarelto. Continue this.  7. Patient is [name] full code.   TIME SPENT: 45 minutes    ____________________________ Cletis Athens. Hower, MD dkh:lm D: 01/29/2014 03:05:14 ET T: 01/29/2014 05:21:41 ET JOB#: [id number]  cc: Cletis Athens. Hower, MD, <Dictator> DAVID Synetta Shadow MD ELECTRONICALLY SIGNED 01/29/2014 22:03

## 2015-01-27 NOTE — Consult Note (Signed)
Admit Diagnosis:   ACUTE KIDNEY INJURY: Onset Date: 01-Feb-2014, Status: Active, Description: ACUTE KIDNEY INJURY   DIGOXIN TOXICITY: Onset Date: 01-Feb-2014, Status: Active, Description: DIGOXIN TOXICITY      Admit Reason:   AKI (acute kidney injury) (584.9): Status: Active, Coding System: ICD9, Coded Name: Acute kidney failure, unspecified   Digoxin toxicity (972.1): Status: Active, Coding System: ICD9, Coded Name: Poisoning by cardiotonic glycosides and drugs of similar action  Home Medications: Medication Instructions Status  Keflex 500 mg oral capsule 1 cap(s) orally 4 times a day for 7 days for infection Active  ondansetron 4 mg oral tablet, disintegrating 1 tab(s) orally every 6 hours nausea Active  aspirin 81 mg oral tablet 1 tab(s) orally once a day for atrial fib Active  latanoprost ophthalmic 0.005% ophthalmic solution 1 drop(s) to each eye once a day (at bedtime) for glaucoma Active  FreshKote preserved ophthalmic solution 1 drop(s) to each eyes 3 times a day for glaucoma Active  Melatonin 1 mg oral tablet 1 tab(s) orally once a day (at bedtime) for sleep Active  Neurontin 100 mg oral capsule 1 cap(s) orally once a day for neuropathy pain Active  Flomax 0.4 mg oral capsule 1 cap(s) orally once a day for urinary retention Active  Percocet 5/325 325 mg-5 mg oral tablet 1 tab(s) orally 3 times a day Active  Imodium A-D 2 mg oral tablet 1 tab(s) orally every 6 hours, As Needed - for Diarrhea Active  magnesium oxide 400 mg oral tablet 1 tab(s) orally once a day for supplement Active  Protonix 40 mg oral delayed release tablet 1 tab(s) orally once a day in the afternoon for GERD Active  Spiriva 18 mcg inhalation capsule 1 puff(s) inhaled once a day for COPD/respiratory failure Active  simvastatin 20 mg oral tablet 1 tab(s) orally once a day (at bedtime) for hypercholesterolemia Active  DULoxetine 60 mg oral delayed release capsule 1 cap(s) orally 2 times a day for depression  Active  albuterol 2.5 mg/3 mL (0.083%) inhalation solution 3 milliliter(s) inhaled every 4 hours, As Needed for copd/resp failure Active  DuoNeb 2.5 mg-0.5 mg/3 mL inhalation solution 3 milliliter(s) inhaled every 6 hours, As Needed for congestion Active  Advair Diskus 250 mcg-50 mcg inhalation powder 1 puff(s) inhaled 2 times a day for COPD Active  cholecalciferol 50,000 intl units oral capsule 1 cap(s) orally once a month Active  amiodarone 200 mg oral tablet 1 tab(s) orally once a day for a-fib Active  Cardizem CD 120 mg/24 hours oral capsule, extended release 1 cap(s) orally once a day for atrial fibrillation Active  Eucerin - topical cream Apply topically to bilateral lower extremities once a day (at bedtime) for skin care Active  Artificial Tears preserved ophthalmic solution 1 drop(s) to each eye every 6 hours, As Needed for k-sicca Active  Biofreeze 0.2%-3.5% topical gel Apply topically to both knees 3 times a day Active  buPROPion 150 mg/24 hours oral tablet, extended release 1 tab(s) orally once a day for depression Active  digoxin 125 mcg (0.125 mg) oral tablet 1 tab(s) orally once a day (in the morning) for rate control Active  Glucophage 500 mg oral tablet 0.5 tab(s) (250 mg) orally once a day (in the morning) for diabetes mellitus Active  Januvia 50 mg oral tablet 1 tab(s) orally once a day for diabetes mellitus Active  Lasix 40 mg oral tablet 1 tab(s) orally 2 times a day for CHF Active  metoprolol tartrate 25 mg oral tablet 1 tab(s) orally  2 times a day for hypertension Active  multivitamin 1 tab(s) orally once a day for supplement Active  Percocet 5/325 325 mg-5 mg oral tablet 1 tab(s) orally once a day, As Needed - for an additional dose for Pain Active  potassium chloride 20 mEq oral tablet, extended release 1 tab(s) orally once a day for supplement Active  Requip 1 mg oral tablet 1 tab(s) orally 2 times a day Active  sodium chloride nasal - nasal spray 1 spray(s) nasal once a  day (at bedtime) for congestion Active  Singulair 10 mg oral tablet 1 tab(s) orally once a day for allergic rhinitis Active  spironolactone 25 mg oral tablet 0.5 tab(s) (12.5 mg) orally once a day for diuretic Active  Synthroid 200 mcg (0.2 mg) oral tablet 1 tab(s) orally once a day (in the morning) for hypothyroidism Active  trimethoprim 100 mg oral tablet 1 tab(s) orally once a day for chronic infection Active  Tylenol 325 mg oral tablet 2 tab(s) (650 mg) orally every 4 hours, As Needed - for Pain/fever Active  Vitamin C 500 mg oral tablet 1 tab(s) orally 2 times a day for supplement Active  Vitamin D2 50,000 intl units oral capsule 1 cap(s) orally every 4 weeks (28 days) for nutritional supplement Active  Xarelto 20 mg oral tablet 1 tab(s) orally once a day for anticoagulant Active  Zanaflex 4 mg oral capsule 1 cap(s) orally every 8 hours, As Needed for muscle spasms Active  Zofran 4 mg oral tablet 1 tab(s) orally every 6 hours, As Needed - for Nausea, Vomiting Active    Lisinopril: Other  amiodarone: Unknown  Case History and Physical Exam:  Chief Complaint Left Flank Pain & Low Back Pain   Nursing/Ancillary Notes: **Vital Signs.:   29-Apr-15 12:20  Vital Signs Type Routine  Temperature Temperature (F) 98  Temperature Source oral  Pulse Pulse 74  Respirations Respirations 20  Systolic BP Systolic BP 107  Diastolic BP (mmHg) Diastolic BP (mmHg) 69  Mean BP 81  Pulse Ox % Pulse Ox % 92  Pulse Ox Activity Level  At rest  Oxygen Delivery 4L     Routine Chem:  29-Apr-15 04:52   Glucose, Serum  169  BUN  21  Creatinine (comp) 0.81  Sodium, Serum 140  Potassium, Serum 3.6  Chloride, Serum 105  CO2, Serum 30  Calcium (Total), Serum 9.1  Anion Gap  5  Osmolality (calc) 286  eGFR (African American) >60  eGFR (Non-African American) >60 (eGFR values <19mL/min/1.73 m2 may be an indication of chronic kidney disease (CKD). Calculated eGFR is useful in patients with stable renal  function. The eGFR calculation will not be reliable in acutely ill patients when serum creatinine is changing rapidly. It is not useful in  patients on dialysis. The eGFR calculation may not be applicable to patients at the low and high extremes of body sizes, pregnant women, and vegetarians.)  Routine Hem:  29-Apr-15 04:52   WBC (CBC)  12.5  RBC (CBC) 3.90  Hemoglobin (CBC)  11.3  Hematocrit (CBC) 35.6  Platelet Count (CBC) 269  MCV 91  MCH 28.9  MCHC  31.7  RDW  17.1  Neutrophil % 81.5  Lymphocyte % 5.1  Monocyte % 12.0  Eosinophil % 0.1  Basophil % 1.3  Neutrophil #  10.2  Lymphocyte #  0.6  Monocyte #  1.5  Eosinophil # 0.0  Basophil #  0.2 (Result(s) reported on 01 Feb 2014 at 05:27AM.)   XRay:  26-Apr-15 00:46, Ribs Left Unilateral  Ribs Left Unilateral   REASON FOR EXAM:    pain following trauma  COMMENTS:       PROCEDURE: DXR - DXR RIBS LEFT UNILATERAL  - Jan 29 2014 12:46AM     CLINICAL DATA:  Left posterior chest pain, radiating anteriorly.  Status post fall.    EXAM:  LEFT RIBS - 2 VIEW    COMPARISON:  Chest radiograph performed 01/28/2013, and CT of the  chest performed 06/02/2013    FINDINGS:  There are mildly displaced fractures of the left seventh, eighth and  ninth ribs; these appear somewhat subacute or possibly chronic in  nature, though new from prior studies.    The lungs are well-aerated. Pulmonary vascularity is at the upper  limits of normal. There is no evidence of focal opacification,  pleural effusion or pneumothorax. There is elevation of the right  hemidiaphragm.    The cardiomediastinal silhouette is mildly enlarged. Thoracolumbar  spinal fusion hardware and cervical spinal fusion hardware are  partially imaged.     IMPRESSION:  1. Mildly displaced fractures of the left seventh, eighth and ninth  ribs. These appear somewhat subacute or possibly chronic in nature,  though new from prior studies.  2. Mild cardiomegaly;  elevation of the right hemidiaphragm.      Electronically Signed    By: Garald Balding M.D.    On: 01/29/2014 00:55         Verified By: JEFFREY . CHANG, M.D.,    29-Apr-15 11:06, Hip Left Complete  Hip Left Complete   REASON FOR EXAM:    Left hip pain  COMMENTS:       PROCEDURE: DXR - DXR HIP LEFT COMPLETE  - Feb 01 2014 11:06AM     CLINICAL DATA:  recent fall. poor historian    EXAM:  LEFT HIP - COMPLETE 2+ VIEW    COMPARISON:  None.    FINDINGS:  There is no evidence of hip fracture or dislocation. There are areas  mild hypertrophic spurring along the superior border of the  acetabulum and greater trochanter.     IMPRESSION:  Mild osteoarthritic changes.  No acute osseous abnormalities.      Electronically Signed    By: Margaree Mackintosh M.D.    On: 02/01/2014 12:38         Verified By: Mikki Santee, M.D., MD    29-Apr-15 11:06, Lumbar Spine With Obliques  Lumbar Spine With Obliques   REASON FOR EXAM:    Low Back Pain and Left leg pain after a fall.  COMMENTS:       PROCEDURE: DXR - DXR LUMBAR SPINE WITH OBLIQUES  - Feb 01 2014 11:06AM     CLINICAL DATA:  recent fall. poor historian    EXAM:  LUMBAR SPINE - COMPLETE 4+ VIEW    COMPARISON:  DG THORACIC SPINE 2V dated 02/01/2014; DG UGI W/ SMALL  BOWEL dated 10/04/2013    FINDINGS:  There is spinal fusion extending from the thoracolumbar through the  lumbosacral region. The hardware appears intact without evidence of  loosening or failure. No acute fractures grossly appreciated. The  bones of the lumbar spine are diffusely osteopenic. Stable  multilevel degenerative disc disease changes within the lower  thoracic extending to the thoracolumbar spine. Atherosclerotic  calcifications appreciated within the aorta. A moderate large amount  of fecal retention is appreciated.     IMPRESSION:  No evidence of acute osseous nor hardware abnormality.  Electronically Signed    By: Margaree Mackintosh M.D.     On: 02/01/2014 12:27     Verified By: Mikki Santee, M.D., MD    29-Apr-15 11:06, Sacroiliac Joints  Sacroiliac Joints   REASON FOR EXAM:    Left lower back pain after a fall.  COMMENTS:       PROCEDURE: DXR - DXR SACROILIAC JOINTS  - Feb 01 2014 11:06AM     CLINICAL DATA:  recent fall. poor historian    EXAM:  BILATERAL SACROILIAC JOINTS - 3+ VIEW    COMPARISON:  None.    FINDINGS:  The sacroiliac joints are maintained. There is mild periarticular  sclerosis and peripheral hypertrophic spurring. No acute fracture or  dislocation.     IMPRESSION:  Degenerative changes within the sacroiliac joints without acute  osseous abnormalities.      Electronically Signed    By: Margaree Mackintosh M.D.    On: 02/01/2014 12:42         Verified By: Mikki Santee, M.D., MD    29-Apr-15 11:06, Thoracic Spine AP and Lateral  Thoracic Spine AP and Lateral   REASON FOR EXAM:    Left Flank pain and back pain after recent fall.  COMMENTS:       PROCEDURE: DXR - DXR THORACIC  AP AND LATERAL  - Feb 01 2014 11:06AM     CLINICAL DATA:  Fall.    EXAM:  THORACIC SPINE - 2 VIEW    COMPARISON:  Chest x-ray 01/23/2014.    FINDINGS:  Patient has had prior cervical and thoracolumbar spine fusion.  Diffuse severe thoracic spine degenerative change present. No acute  abnormality identified. Severe degenerative change present. No  paraspinal abnormalities. Right base atelectasis and/or scarring  noted.     IMPRESSION:  1.  Prior cervical and thoracolumbar spine fusion.    2. Severe degenerative change.  No acute abnormality identified.      Electronically Signed    By: Marcello Moores  Register    On: 02/01/2014 12:33       Verified By: Osa Craver, M.D., MD    Impression Chronic Pain Syndrome (338.4/G89.4) Osteoarthrosis (715.9)  Chronic Low Back Pain/Lumbago (724.2/M54.5) Lumbosacral Osteoarthritic Arthropathy/Spondylosis Lumbar Degenerative Disc Disease  (722.52/M51.36) Secondary Myofascial Pain Syndrome (729.1/M60.9) Left Lower Extremity Pain (729.5) Left Lumbosacral Radiculopathy/Radiculitis (724.4) Left Lumbosacral Dermatomal Radicular Pain (729.2) Left Hip Pain Left Hip Joint Degenerative Joint Disease Left Hip Arthropathy Left Hip Osteoarthritis Morbid Obesity (278.01/E66.01) High Blood Pressure (Unspecified Hypertension) (401.9/I10) Atrial Fibrilation (427.31)  Xarelto Anticoagulation. 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 Bowel obstruction. Possible Liver Mets from yet undiagnosed malignancy.   Plan - Currently not a good candidate for any interventional pain management therapies. - Continue with current analgesic pharmacotherapy or consider the following option: 1. Start patient on a Fentanyl PCA (50 mcg/ml) with a dose of 1.0 ml, and a lock-out period of 10 minutes. PCA mode only, without basal rate. After 24 hours add total amount used and conver to transdermal using an IV to transdermal ratio of 1:1. 2. Consult Physical Therapy for a Transcutaneous Electrical Nerve Stimulation Trial (TENS). - Refer patient to pain clinic as an outpatient once acute medical problems are solved.   Electronic Signatures: Noberto Retort (MD)  (Signed 29-Apr-15 20:36)  Authored: Health Issues, Home Medications, Allergies, History and Physical Exam, Vital Signs, Labs, Radiology, Impression/Plan  Last Updated: 29-Apr-15 20:36 by Noberto Retort (MD)

## 2015-01-27 NOTE — Consult Note (Signed)
PATIENT NAME:  [name], [name] MR#:  [id number] DATE OF BIRTH:  December 06, 1940  DATE OF CONSULTATION:  06/05/2014  REFERRING PHYSICIAN:   Dr.  Imogene Burn    CONSULTING PHYSICIAN:  Stann Mainland. Sampson Goon, MD  REASON FOR CONSULTATION:  ESBL urinary tract infection as well as sepsis.   HISTORY OF PRESENT ILLNESS: This is a pleasant 74 year old female with history of morbid obesity, difficulty walking due to peripheral neuropathy, sleep apnea on CPAP, COPD, hypertension, diabetes, diastolic heart failure and coronary artery disease, who lives at [facility].  She reports he was in her usual state of health until a few days prior to admission, which was on August 28th. She felt like she developed the flu with some body aches and fevers. She also has a mild dry cough. She was also having some left-sided abdominal pain.  She did not have any dysuria, but did have some change in the color of her urine and odor. She reports a urinalysis was checked at the facility, but for the result came back she was febrile to 102.8 and became altered.  She had been started on Rocephin, Cipro and Flagyl.  The patient was brought to the Emergency Room where she was hypoxic and intubated. She was started on vancomycin and Zosyn as well as steroids.  Since then, she been extubated, come out of the Intensive Care Unit and is now on the floor.  She reports feeling a lot better, but is still on 2 liters of oxygen. She does still have some mild left lower quadrant abdominal pain.  She has no ongoing dysuria or hematuria.  She has been afebrile.   PAST MEDICAL HISTORY:  COPD, OSA, hypothyroidism, hypertension, diabetes, hyperlipidemia, diastolic CHF, tobacco abuse, morbid obesity, coronary artery disease, proximal atrial fibrillation.   ALLERGIES: LISINOPRIL, AMIODARONE.   SOCIAL HISTORY: She is a resident of [facility].  Prior tobacco use.   REVIEW OF SYSTEMS:  An 11 review of systems reviewed and negative except as per history of  present illness.   FAMILY HISTORY: Positive for hypertension and diabetes.  ANTIBIOTICS SINCE ADMISSION: Include: 1.  Vancomycin from August 28th x 1 dose. 2.  Zosyn, August 28th to August 30th.    3.  Meropenem begun August 30th.   OTHER MEDICATIONS: Include Wellbutrin, Cymbalta, gabapentin, levothyroxine, metoprolol, Singulair, pantoprazole, prednisone tapering down to 40 mg daily currently, simvastatin, Flomax, Zanaflex, aspirin.   PHYSICAL EXAMINATION:  VITAL SIGNS: Temperature is currently 98.6, pulse 72, blood pressure 105/50, respirations 21, sats 98% on room air. She has been afebrile for several days now.  GENERAL: She is morbidly obese, lying in bed in no acute distress on oxygen.  HEENT: Pupils equal, round, and reactive to light and accommodation. Extraocular movements are intact. Sclerae anicteric.  OROPHARYNX: Clear.  NECK: Supple.  HEART: Regular.  LUNGS: Have decreased breath sounds bilateral base.  ABDOMEN: Obese and mildly distended, mild firmness, mild tender to palpation, left lower quadrant.  EXTREMITIES: 1+ edema in bilateral lower extremities.  SKIN: No severe ulcers, just on her left heel an initial beginning ulcer.  NEUROLOGIC: She is alert and oriented x 3. Grossly nonfocal neuro exam.   LABORATORY, DIAGNOSTIC AND RADIOLOGICAL DATA: Microbiology August 28th blood cultures x 2 negative. Urine culture August 28th with 30,000 colonies of Escherichia coli pan sensitive and 20,000 colonies of ESBL producing Escherichia coli sensitive only to gentamicin and imipenem.  Follow-up urine culture August 29th showed mixed flora.  Urinalysis done August 29th showed 368 white cells.  White blood on admission was 5.4, increased to 29.5 and is currently down to 13.7.  Hemoglobin 10.9, platelets 160.   Renal function shows creatinine 1.3, down from 2.12 the day after admission, LFTs show albumin 2.3, alkaline phosphatase slightly elevated at 129, AST 47.  CK was 240.  TSH normal.    IMAGING: Chest x-ray August 28th showed findings consistent with congestive heart failure, pulmonary interstitial edema and left-sided pleural effusion.  Chest x-ray done August 31st shows left lower lobe airspace disease and cardiomegaly with mild edema.  Ultrasound of the kidneys showed no acute findings.   IMPRESSION: A 74 year old admitted with sepsis, fever to 102.8 and hypoxia as well as flulike symptoms on August 28th.  She had initially a low white count that peaked, although she did have steroids.  She is clinically much improved with 1 dose of vancomycin and Zosyn.  She was only changed to meropenem yesterday on August 30th.  She does have a low colony count of the ESBL Escherichia coli in her urine.   Multiple possible sources of her sepsis including her urine, but also pneumonia and or abdominal process such as diverticulitis with her left lower quadrant abdominal pain.  Regardless, she is much improved despite not having received treatment for specifically ESBL.   RECOMMENDATIONS:  1.  Continue meropenem but at discharge can change her to oral antibiotics to cover her pneumonia as well as possible intra-abdominal infection.  2.  Repeat a urinalysis and urine culture today.  3.  If worsens, consider CT of her abdomen.  Thank you for the consult. I will be glad to follow with you.     ____________________________ Stann Mainlandavid P. Sampson GoonFitzgerald, MD dpf:DT D: 06/05/2014 16:39:54 ET T: 06/05/2014 17:23:25 ET JOB#: [id number]  cc: Stann Mainlandavid P. Sampson GoonFitzgerald, MD, <Dictator> Adriona Kaney Sampson GoonFITZGERALD MD ELECTRONICALLY SIGNED 06/15/2014 10:23

## 2015-01-27 NOTE — Consult Note (Signed)
PATIENT NAME:  [name], [name] MR#:  [id number] DATE OF BIRTH:  November 06, 1940  DATE OF CONSULTATION:  01/30/2014  REFERRING PHYSICIAN:  Dr. Clint Guy. CONSULTING PHYSICIAN:  Muhammad A. Kirke Corin, MD  REASON FOR CONSULTATION: Digoxin toxicity.   HISTORY OF PRESENT ILLNESS: This is a 74 year old female with known history of coronary artery disease status post previous stenting, persistent atrial fibrillation with previous cardioversion, hypertension, type 2 diabetes, hyperlipidemia, diastolic heart failure and multiple presentations for mental status changes and unresponsiveness due to polypharmacy. She presented to the hospital with severe left-sided chest pain worse with movement and deep breath. She fell about two weeks ago and fractured three ribs on her side. She is currently in severe pain. She reports no relief with IV Dilaudid. She has not been eating and drinking well due to the discomfort. She was noted to be in acute renal failure with a creatinine of 2.66, digoxin  level was 2.09. ECG showed junctional bradycardia with a heart rate of 41 beats per minute. She denies significant dyspnea. No syncope or presyncope.   PAST MEDICAL HISTORY: 1. Coronary artery disease as outlined above.  2. Chronic diastolic heart failure.  3. Paroxysmal atrial fibrillation/flutter.  4. Obstructive sleep apnea.  5. Hypothyroidism.  6. Hypertension.  7. Type 2 diabetes.  8. Chronic obstructive pulmonary disease.   SOCIAL HISTORY: Remarkable for smoking. There is remote history of alcohol use. She denies any recreational drug use.   FAMILY HISTORY: Negative for premature coronary artery disease.   ALLERGIES: INCLUDE LISINOPRIL. AMIODARONE IS ALSO LISTED IN HER ALLERGIES BUT I AM NOT SURE ABOUT THE REACTION.    HOME MEDICATIONS: Include Tylenol as needed, aspirin 81 mg daily, Percocet every 6 hours as needed, Flomax 0.4 mg once daily, diltiazem 60 mg twice daily, digoxin 0.125 mg once daily, Xarelto 20 mg once daily,  Neurontin 100 mg once daily, Glucophage 500 mg once daily, simvastatin 20 mg once daily, Requip 1 mg twice daily, Lopressor 25 mg twice daily, Advair 250/50 mcg twice daily, Spiriva once daily, Lasix 40 mg daily, Singulair 10 mg daily, magnesium oxide, eyedrops, Protonix 40 mg once daily, Synthroid.   REVIEW OF SYSTEMS: A 10-point review of systems review of systems was performed. It is negative other than what is mentioned in the history of present illness.   PHYSICAL EXAMINATION: GENERAL: The patient appears to be older than her stated age. Currently she is in significant distress due to pain.  VITAL SIGNS: Temperature 97.9, pulse 60, respiratory rate 18, blood pressure 165/65. and oxygen saturation is 92% on 2 liters.  HEENT: Normocephalic, atraumatic.  NECK: No jugular venous distention or carotid bruits.  RESPIRATORY: Normal respiratory effort with no use of accessory muscles. Auscultation reveals normal breath sounds.  CARDIOVASCULAR: Normal PMI. Normal S1 and S2 with no gallops or murmurs.  ABDOMEN: Benign, nontender, and nondistended.  EXTREMITIES: No clubbing, cyanosis, or edema.  SKIN: Warm and dry with no rash.  PSYCHIATRIC: She is alert and oriented x 3. She is in severe pain.   LABORATORY AND DIAGNOSTIC DATA: Creatinine was 2.66 on presentation and improved to 1.72. BNP was 6000. Troponin was negative. Initial digoxin level was 2.09 and improved to 2.03. White cell count was 14.5. EKG showed junctional bradycardia with diffuse ST depression consistent with a digoxin effect.   IMPRESSION: 1. Digoxin toxicity.  2. History of atrial fibrillation currently in junctional rhythm due to #1.  3. Acute renal failure.  4. Rib fractures.  5. Coronary artery disease with no  evidence of acute coronary syndrome.   RECOMMENDATIONS: I agree with holding digoxin and stopping it altogether. I advised against use of digoxin in the future given her recurrent hospitalizations and risk of renal  failure in the future. I agree with stopping other AV nodal medications for now until bradycardia resolves.  We have to clarify if she is on amiodarone or not. Metoprolol can be resumed. Continue anticoagulation with Xarelto. There is no indication for a temporary pacemaker or Digibind at this time. Digoxin level seems to be improving with improved renal function.   ____________________________ Chelsea AusMuhammad A. Kirke CorinArida, MD maa:sg D: 01/30/2014 09:24:38 ET T: 01/30/2014 09:38:23 ET JOB#: [id number]  cc: Muhammad A. Kirke CorinArida, MD, <Dictator> [place] OuchMUHAMMAD A ARIDA MD ELECTRONICALLY SIGNED 02/15/2014 22:38

## 2015-01-27 NOTE — Consult Note (Signed)
Chief Complaint:  Subjective/Chief Complaint C/O left side abd pain. No BM in 2 days. Chronic hx of constipation.   VITAL SIGNS/ANCILLARY NOTES: **Vital Signs.:   01-May-15 06:11  Vital Signs Type Routine  Temperature Temperature (F) 98.4  Celsius 36.8  Temperature Source oral  Pulse Pulse 79  Respirations Respirations 18  Systolic BP Systolic BP 92  Diastolic BP (mmHg) Diastolic BP (mmHg) 60  Mean BP 70  Pulse Ox % Pulse Ox % 94  Pulse Ox Activity Level  At rest  Oxygen Delivery 4L    08:08  Vital Signs Type Recheck  Systolic BP Systolic BP 428  Diastolic BP (mmHg) Diastolic BP (mmHg) 92  Mean BP 105   Brief Assessment:  GEN no acute distress   Cardiac Regular   Respiratory clear BS   Gastrointestinal mild LLQ tenderness   Lab Results: Oncology:  29-Apr-15 04:52   AFP, Tumor Marker (Serial Monitoring) 1.6 [facility] methodology            [facility]            No: [id number]           [address] [place], [place] [address]           Lindon Romp, MD         [phone] Result(s) reported on 02 Feb 2014 at 12:49PM.)  30-Apr-15 05:17   Carcinoembryonic Antigen (CEA)  12.5 ([facility] ECLIA methodology       Nonsmokers  <3.9                                                     Smokers     <5.6            [facility]            No: [id number]           [address], [place], [place] [address]           Lindon Romp, MD         [phone] Result(s) reported on 03 Feb 2014 at 06:50AM.)  Carcinoembryonic Antigen ========== TEST NAME ==========  ========= RESULTS =========  = REFERENCE RANGE =  CARCINOEMBRYONIC AB   Carbohydrate Antigen 19-9  36 ([facility] methodology            [facility]            No: [id number]           [address], [place], [place] [address]           Lindon Romp, MD         [phone] Result(s) reported on 03 Feb 2014 at 06:50AM.)  CA 27.29 (Serial Monitoring) 25.0 Engineering geologist)  CA 27.29 ========== TEST NAME ==========  ========= RESULTS =========  = REFERENCE RANGE =  CA 27.29   Routine Chem:  29-Apr-15 04:52   BUN  21  Creatinine (comp) 0.81  Sodium, Serum 140  Potassium, Serum 3.6  Chloride, Serum 105  CO2, Serum 30  Calcium (Total), Serum 9.1  Anion Gap  5  Osmolality (calc) 286  eGFR (African American) >60  eGFR (Non-African American) >60 (eGFR values <56mL/min/1.73 m2 may be an indication of chronic kidney disease (CKD). Calculated eGFR is useful in patients with stable renal function. The  eGFR calculation will not be reliable in acutely ill patients when serum creatinine is changing rapidly. It is not useful in  patients on dialysis. The eGFR calculation may not be applicable to patients at the low and high extremes of body sizes, pregnant women, and vegetarians.)  Routine Hem:  29-Apr-15 04:52   WBC (CBC)  12.5  RBC (CBC) 3.90  Hemoglobin (CBC)  11.3  Hematocrit (CBC) 35.6  Platelet Count (CBC) 269  MCV 91  MCH 28.9  MCHC  31.7  RDW  17.1  Neutrophil % 81.5  Lymphocyte % 5.1  Monocyte % 12.0  Eosinophil % 0.1  Basophil % 1.3  Neutrophil #  10.2  Lymphocyte #  0.6  Monocyte #  1.5  Eosinophil # 0.0  Basophil #  0.2 (Result(s) reported on 01 Feb 2014 at 05:27AM.)  30-Apr-15 05:17   WBC (CBC)  13.1  RBC (CBC) 3.93  Hemoglobin (CBC)  11.3  Hematocrit (CBC) 35.9  Platelet Count (CBC) 280  MCV 91  MCH 28.8  MCHC  31.6  RDW  16.9  Neutrophil % 83.1  Lymphocyte % 5.4  Monocyte % 10.8  Eosinophil % 0.3  Basophil % 0.4  Neutrophil #  10.9  Lymphocyte #  0.7  Monocyte #  1.4  Eosinophil # 0.0  Basophil # 0.1 (Result(s) reported on 02 Feb 2014 at 05:51AM.)  01-May-15 05:02   WBC (CBC)  14.0  RBC (CBC)  3.78  Hemoglobin (CBC)  11.0  Hematocrit (CBC)  34.3  Platelet Count (CBC) 281  MCV 91  MCH 29.0  MCHC 32.0  RDW  16.6  Neutrophil % 82.2  Lymphocyte % 5.0  Monocyte % 11.7  Eosinophil % 0.6   Basophil % 0.5  Neutrophil #  11.5  Lymphocyte #  0.7  Monocyte #  1.6  Eosinophil # 0.1  Basophil # 0.1 (Result(s) reported on 03 Feb 2014 at 05:36AM.)   Assessment/Plan:  Assessment/Plan:  Assessment Chronic constipation. Elevated CEA. Hx of multiple polyps.   Plan Since prep for the last colonoscopy was poor, willing to repeat colonoscopy next week. Must stop xarelto for minimum of 48 hrs before colonoscopy can be arranged. Start miralax. Dr. [place] Norris covering this weekend. Thanks.   Electronic Signatures: Verdie Shire (MD)  (Signed [id number] 09:32)  Authored: Chief Complaint, VITAL SIGNS/ANCILLARY NOTES, Brief Assessment, Lab Results, Assessment/Plan   Last Updated: 01-May-15 09:32 by Verdie Shire (MD)

## 2015-01-27 NOTE — Consult Note (Signed)
Brief Consult Note: Diagnosis: GI consultation for the concern of hepatic malignancy.  CT scan was done without contrast during this hospitalization, but she did not have a CT scan of abdomen and pelvis with contrast done 05/2013 with finding of liver being unremarkable.  AFP-tumor marker wnl. Left sided abdominal pain.  Chronic constipation.  CT scan revealed findings concsistent with fecal impaction.  Digoxin toxicity.   Comments: Patient's presentation discussed with Dr. Lutricia FeilPaul Oh. Recommend correction of constipation.  Do not feel hepatic maligancy is of concern at this time based on comparing CT scan of abodmen and pelvis as scan was done 05/2013 and liver was unremarkable with AFP-tumor marker currently wnl.  Still pending CEA level though.  If scan of abodmen and pelvis without our constract done during this hospitalization is of concern still for malignacy recommmend proceeding with MR of abdomen to allow further imaging of liver.  Do recommend consideration of pelvic ultrasound due to finding of adnexal cysts and size of cysts in a menopausal female.  Recommend correction of chronic constipation.  Possible underlying cause of constipation.  Will continue to monitor.  Electronic Signatures: Rodman KeyHarrison, Janeth Terry S (NP)  (Signed 30-Apr-15 16:17)  Authored: Brief Consult Note   Last Updated: 30-Apr-15 16:17 by Rodman KeyHarrison, Charnetta Wulff S (NP)

## 2015-01-27 NOTE — Consult Note (Signed)
Chief Complaint:  Subjective/Chief Complaint Patient with chronic constipation and reporting left sided abdomonal pain. THe patient has had a colonoscopy in the past with a poor prep. Dr. Bluford Kaufmannh recommends a repeat colonoscopy for increased CEA and IDA.   VITAL SIGNS/ANCILLARY NOTES: **Vital Signs.:   02-May-15 04:24  Vital Signs Type Routine  Temperature Temperature (F) 98.7  Celsius 37  Temperature Source oral  Pulse Pulse 83  Respirations Respirations 20  Systolic BP Systolic BP 99  Diastolic BP (mmHg) Diastolic BP (mmHg) 67  Mean BP 77  Pulse Ox % Pulse Ox % 97  Pulse Ox Activity Level  At rest  Oxygen Delivery 2L  *Intake and Output.:   Daily 02-May-15 07:00  Grand Totals Intake:  50 Output:      Net:  50 24 Hr.:  50  IV (Secondary)      In:  50  Length of Stay Totals Intake:  6800 Output:  1700    Net:  5100   Brief Assessment:  GEN well developed, well nourished, no acute distress   Respiratory normal resp effort   Additional Physical Exam Alert and orientated times 3   Lab Results: Routine Hem:  01-May-15 05:02   WBC (CBC)  14.0  RBC (CBC)  3.78  Hemoglobin (CBC)  11.0  Hematocrit (CBC)  34.3  Platelet Count (CBC) 281  MCV 91  MCH 29.0  MCHC 32.0  RDW  16.6  Neutrophil % 82.2  Lymphocyte % 5.0  Monocyte % 11.7  Eosinophil % 0.6  Basophil % 0.5  Neutrophil #  11.5  Lymphocyte #  0.7  Monocyte #  1.6  Eosinophil # 0.1  Basophil # 0.1 (Result(s) reported on 03 Feb 2014 at 05:36AM.)   Assessment/Plan:  Assessment/Plan:  Assessment IDA. Chronic constipation. Left sided abd. pain. Increased CEA.   Plan Patient will be preped for a colonoscopy tomorrow for a procedure by Dr. Bluford Kaufmannh Monday.   Electronic Signatures: Midge MiniumWohl, Janai Brannigan (MD)  (Signed [phone]-May-15 09:34)  Authored: Chief Complaint, VITAL SIGNS/ANCILLARY NOTES, Brief Assessment, Lab Results, Assessment/Plan   Last Updated: 02-May-15 09:34 by Midge MiniumWohl, Inocente Krach (MD)

## 2015-01-27 NOTE — Consult Note (Signed)
PATIENT NAME:  [name], Marella A MR#:  [id number] DATE OF BIRTH:  Nov 25, 1940  ADDENDUM  IMPRESSION: GI consultation for the concern of hepatic malignancy.   CT scan done without contrast did reveal a concern of malignancy, but she did have a CT scan of her abdomen and pelvis with contrast, which was done in August 2014, with findings of liver being unremarkable. AFP tumor marker within normal limits during hospitalization. Left-sided abdominal pain, probably in correlation with chronic constipation. CT scan revealed findings consistent with fecal impaction. Dig toxicity.    PLAN: At this time, recommend patient to be monitored, recommend correction of chronic constipation, which will hopefully allow improvement of left-sided abdominal pain. I do not feel that hepatic malignancy is of concern at this time, but if this is still raised as a concern, certainly an MR of abdomen would allow further imaging. Still pending CEA result at this time. She has, within the last 1 to 2 years, undergone a colonoscopy as well as an upper endoscopy, and finding of colonic polyps, but otherwise no other concerns such as malignancy or masses. Will continue to monitor.   CT scan of abdomen and pelvis without contrast that was done during hospitalization does raise concern, though, for findings of 2 cm adnexal cysts bilaterally. Do recommend consideration of proceeding forward with a pelvic ultrasound, given this finding in the setting of a menopausal female.   These services provided by Rodman Keyawn S. Jolisa Intriago, MS, APRN, [facility], FNP under collaborative agreement with Dr. Lutricia FeilPaul Oh.   ____________________________ Rodman Keyawn S. Rolen Conger, NP dsh:mr D: 02/02/2014 16:16:38 ET T: 02/02/2014 19:35:32 ET JOB#: [id number]  cc: Rodman Keyawn S. Odile Veloso, NP, <Dictator> Rodman KeyAWN S Anayi Bricco MD ELECTRONICALLY SIGNED 02/09/2014 7:42

## 2015-01-27 NOTE — Consult Note (Signed)
Chief Complaint:  Subjective/Chief Complaint NO REPORTED RECENT ACUTE COMPLAINTS   VITAL SIGNS/ANCILLARY NOTES: **Vital Signs.:   01-May-15 06:11  Vital Signs Type Routine  Temperature Temperature (F) 98.4  Celsius 36.8  Temperature Source oral  Pulse Pulse 79  Respirations Respirations 18  Systolic BP Systolic BP 92  Diastolic BP (mmHg) Diastolic BP (mmHg) 60  Mean BP 70  Pulse Ox % Pulse Ox % 94  Pulse Ox Activity Level  At rest  Oxygen Delivery 4L    08:08  Vital Signs Type Recheck  Systolic BP Systolic BP 132  Diastolic BP (mmHg) Diastolic BP (mmHg) 92  Mean BP 105    11:49  Vital Signs Type Routine  Temperature Temperature (F) 98.2  Celsius 36.7  Temperature Source oral  Pulse Pulse 73  Respirations Respirations 20  Systolic BP Systolic BP 96  Diastolic BP (mmHg) Diastolic BP (mmHg) 67  Mean BP 76  Pulse Ox % Pulse Ox % 97  Pulse Ox Activity Level  At rest  Oxygen Delivery 2L   Brief Assessment:  GEN no acute distress   Respiratory normal resp effort   Lab Results: Routine Hem:  01-May-15 05:02   WBC (CBC)  14.0  RBC (CBC)  3.78  Hemoglobin (CBC)  11.0  Hematocrit (CBC)  34.3  Platelet Count (CBC) 281  MCV 91  MCH 29.0  MCHC 32.0  RDW  16.6  Neutrophil % 82.2  Lymphocyte % 5.0  Monocyte % 11.7  Eosinophil % 0.6  Basophil % 0.5  Neutrophil #  11.5  Lymphocyte #  0.7  Monocyte #  1.6  Eosinophil # 0.1  Basophil # 0.1 (Result(s) reported on 03 Feb 2014 at 05:36AM.)   Radiology Results: MRI:    01-May-15 16:15, MRI Abdomen WWO  MRI Abdomen WWO   REASON FOR EXAM:    liver mass  COMMENTS:       PROCEDURE: MR  - MR ABDOMEN WO/W CONTRAST  - Feb 03 2014  4:15PM     CLINICAL DATA:  Evaluate liver mass    EXAM:  MRI ABDOMEN WITHOUT AND WITH CONTRAST    TECHNIQUE:  Multiplanar multisequence MR imaging of the abdomen was performed  both before and after the administration of intravenous contrast.    CONTRAST:  20 cc of  MultiHance  COMPARISON:  None.    FINDINGS:  Exam detail is diminished secondary to respiratory motion artifact.  There are smallbilateral pleural effusions.    There are areas of "loss of signal" on the out of phase images  within the right hepatic lobe compatible with focal fatty  deposition. No abnormal enhancing liver lesions identified. Stones  are identified within the gallbladder. These measure up to 6 mm.  There is no gallbladder wall thickening or pericholecystic fluid.  The common bile duct measures 9 mm. No choledocholithiasis  identified. There is mild atrophy of the pancreatic parenchyma. The  spleen is unremarkable. There is no pancreatic mass identified. The  spleen is on unremarkable.  The right adrenal gland is normal. There is asymmetric enlargement  of the left adrenal gland. Bilateral renal cysts are noted.    The abdominal aorta has a normal caliber.  There is no aneurysm.    Normal signal throughout the bone marrow.     IMPRESSION:  1. Fatty deposition within the right hepatic lobe accounts for the  areas of low attenuation within the liver seen on recent CT scan. No  worrisome mass identified.  2. Gallstones.  3. Renal cysts  4. Asymmetric enlargement of the left adrenal gland.  Electronically Signed    By: Signa Kell M.D.    On: 02/03/2014 17:09         Verified By: Rosealee Albee, M.D.,   Assessment/Plan:  Assessment/Plan:  Assessment 1. LIVER ABNORMALITY LOOKS BENIGN ON MRI                                                                                                                       2. BORDERLINE ELEVATED CA 19/9 SHOULD BE REPEATED IN THE FUTURE, PANCREAS UNREMARKABLE ON MRI                  3. RENAL CYST ON U/S CONFIRMED ON MRI                                                                                                                             4.MILD ANEMIA, CAN NOT R/0 IDA BUT COLONOSCOPY IS PLANNED, OTHERWISE COMPATABLE WITH  CHRONIC                     ANEMIA, DIABETES.                                                                                                                                                                   5.ELEVATED CEA AT 12, SUSPICIOUS FOR MALIGNANCY, BUT ACUTE AND SUBACUTE GI INFLAMMATION CAN                         ELEVATE CEA. GI IS FOLLOWING AND COLONOSCOPY IS PLANNED. IF COLONOSCOPY NEGATIVE, SERIAL CEA  NEEDED, CAN ALSO BE ELEVATED IN UPPER GI OR SMALL BOWEL TUMOR OR LUNG CANCER.                                              6. SMALL OVARIAN CYSTS, AGREE THAN IN ELDERLY FEMALE , PELVIC U/S WITH TRANSVAGINAL VIEW                                SHOULD F/U ON CT FINDINGS, AND IF ANY SUSPICIOUS FINDINGS THEN ALSO CHECK CA 125   Electronic Signatures: Marin Roberts (MD)  (Signed 01-May-15 18:26)  Authored: Chief Complaint, VITAL SIGNS/ANCILLARY NOTES, Brief Assessment, Lab Results, Radiology Results, Assessment/Plan   Last Updated: 01-May-15 18:26 by Marin Roberts (MD)

## 2015-02-04 NOTE — Discharge Summary (Signed)
PATIENT NAME:  [name], [name] MR#:  [id number] DATE OF BIRTH:  04/22/1941  DATE OF ADMISSION:  01/09/2015 DATE OF DISCHARGE:  01/15/2015  DISCHARGE DIAGNOSES:  1.  Acute encephalopathy secondary to sepsis back to baseline 2.  Sepsis present on admission from urinary tract infection and/or possible aspiration pneumonia noted on CT scan of the chest. 3.  Acute respiratory failure from aspiration pneumonia. 4.  Hypothyroidism with elevated T4 and suppressed TSH, decreased the dose of Synthroid.  5.  Pneumonia.   SECONDARY DIAGNOSES:   1.  History of chronic obstructive pulmonary disease, asthma.  2.  Obstructive sleep apnea.  3.  Hypothyroidism.  4.  Hypertension.  5.  Diabetes.  6.  Hyperlipidemia.  7.  History of diastolic congestive heart failure with ejection fraction of 55% on echocardiogram in May 2015.  8.  History of tobacco abuse.  9.  Morbid obesity.  10. Coronary artery disease.  11. Paroxysmal atrial fibrillation on Xarelto.   CONSULTATIONS:  1.  Pulmonary, Dory Larsen, MD 2.  Physical therapy.   PROCEDURES AND RADIOLOGY:  1.  Chest x-ray on April 5 showed elevated right hemidiaphragm. Right mid lung and basilar atelectasis. Right hilar fullness.  2.  Chest x-ray on April 5 later in the day showed interim placement of left IJ line. Endotracheal tube and NG tube in a stable position. Right hilar mass cannot be excluded. Abdominal x-ray on April 5 showed NG tube deep at the proximal stomach.  3.  CT scan of the chest with contrast on April 5 showed right lower lobe collapse with extensive airway obstruction, questionable aspiration, superimposed pneumonia seen. CT scan of the head without contrast on April 5 showed no acute intracranial abnormality.  4.  Chest x-ray on April 9 showed extubation without any complication.  Bibasilar atelectasis.   MAJOR LABORATORY PANEL:  1.  UA on admission showed 9143 WBCs, 3+ bacteria, WBC in clumps seen, 2+ leukocyte esterase.  2.   Blood culture grew coagulase negative Staphylococcus from 1 out of 2 bottles. Urine culture had contaminated culture.  3.  Repeat urine culture on April 6 showed 5000 colonies of Escherichia coli, 8000 colonies of Providencia stuartii or ESBL. 4.  Repeat urine culture on April 7 was negative.  5.  Stool for Clostridium difficile was negative.   HISTORY AND SHORT HOSPITAL COURSE: The patient is a 74 year old female with the above-mentioned medical problems who was admitted for hypoxia and decreased responsiveness.  Was found to have acute metabolic encephalopathy. Please see Dr. Serita Grit Patel's dictated history and physical for further details. The patient was emergently intubated on April 5 by Dr. Belia Heman for respiratory failure.  The patient was intubated along with had a central line placed by Dr. Belia Heman same time as her CT chest showed pneumonia. The patient was continued on broad-spectrum antibiotic, was slowly improving on same. She was extubated on April 9. The patient was doing much better by April 11, was evaluated by physical therapy.  It was recommended short-term rehab.  She is being discharged in stable condition.   On the date of discharge her vital signs are as follows: Temperature is 98.2, heart rate 60 per minute, respirations 18 per minute, blood pressure 143/68, she was saturating 90 to 92% on room air.   PERTINENT PHYSICAL EXAMINATION ON THE DATE OF DISCHARGE: CARDIOVASCULAR: S1, S2 normal. No murmurs.  LUNGS: Clear to auscultation bilaterally. No wheezing, rales, rhonchi, or crepitation.  ABDOMEN: Soft, benign.  NEUROLOGIC: Nonfocal examination.  All other physical examination remained at baseline.  DISCHARGE MEDICATIONS:   Medication Instructions  ondansetron 4 mg oral tablet, disintegrating  1 tab(s) orally every 6 hours, As Needed - for Nausea, Vomiting   aspirin 81 mg oral tablet  1 tab(s) orally once a day for atrial fib   flomax 0.4 mg oral capsule  1 cap(s) orally once a  day for urinary retention   spiriva 18 mcg inhalation capsule  1 puff(s) inhaled once a day for COPD/respiratory failure   duloxetine 60 mg oral delayed release capsule  1 cap(s) orally 2 times a day for depression   albuterol 2.5 mg/3 ml (0.083%) inhalation solution  3 milliliter(s) inhaled every 4 hours, As Needed for copd/resp failure   duoneb 2.5 mg-0.5 mg/3 ml inhalation solution  3 milliliter(s) inhaled every 6 hours, As Needed for congestion   advair diskus 250 mcg-50 mcg inhalation powder  1 puff(s) inhaled 2 times a day for COPD   amiodarone 200 mg oral tablet  1 tab(s) orally once a day for a-fib   eucerin - topical cream  Apply topically to bilateral lower extremities once a day (at bedtime) for skin care   artificial tears preserved ophthalmic solution  1 drop(s) to each eye every 6 hours, As Needed for k-sicca   bupropion 150 mg/24 hours oral tablet, extended release  1 tab(s) orally once a day for depression   requip 1 mg oral tablet  1 tab(s) orally 2 times a day   singulair 10 mg oral tablet  1 tab(s) orally once a day for allergic rhinitis   xarelto 20 mg oral tablet  1 tab(s) orally once a day for anticoagulant   metoprolol tartrate 25 mg oral tablet  1 tab(s) orally once a day   tylenol 325 mg oral tablet  2 tab(s) orally every 6 hours, As Needed - for Fever   benadryl 25 mg oral tablet  1 tab(s) orally every 6 hours, As Needed - for Itching   vitamin d2 50,000 intl units oral capsule  1 cap(s) orally once a month on the 7th of each month   guaifenesin 400 mg oral tablet  1 tab(s) orally every 8 hours, As Needed for cough   potassium chloride 20 meq oral tablet, extended release  1 tab(s) orally once a day   glucophage 500 mg oral tablet  0.5 tab(s) orally once a day   docusate sodium 100 mg oral tablet  1 tab(s) orally every 12 hours, As Needed - for Constipation   oxycodone 5 mg oral tablet  1 tab(s) orally every 4 hours x 3 days, As Needed - for Pain   melatonin 5 mg oral  tablet  1 tab(s) orally once a day (at bedtime)   simvastatin 10 mg oral tablet  1 tab(s) orally once a day (at bedtime)   lasix 40 mg oral tablet  1 tab(s) orally 2 times a day, As Needed if weight is greater than 245   clobetasol topical 0.05% topical cream  Apply topically to affected area 2 times a day, As Needed   omeprazole 20 mg oral delayed release tablet  1 tab(s) orally once a day   potassium chloride extended release 10 meq oral tablet, extended release  1 tab(s) orally once a day for weight gain   requip 2 mg oral tablet  1 tab(s) orally once a day (at bedtime)   loperamide 2 mg oral tablet  1 tab(s) orally every 6 hours, As Needed  furosemide 20 mg oral tablet  1 tab(s) orally 2 times a day   neurontin 100 mg oral capsule  1 cap(s) orally 3 times a day   levothyroxine 125 mcg (0.125 mg) oral tablet  1 tab(s) orally once a day   fosfomycin 3 g oral powder for reconstitution  1 each orally every 3 days x 6 days    DISCHARGE DIET: Low sodium, low fat, low cholesterol, 1800 ADA.   DISCHARGE ACTIVITY: As tolerated.   DISCHARGE INSTRUCTIONS AND FOLLOWUP:  Patient was instructed to follow up with her primary care physician Dr. Lorie Phenix in 1 to 2 weeks. She will need follow up with Dr. Clydie Braun from infectious disease in 2 to 4 weeks. She will get physical therapy evaluation and management while at the facility. She was instructed to get her hypoglycemia protocol started at the facility if her sugar drops.  She was also instructed to get Narcan use if her mental status changes for opiate use.  Again, she remains a very high risk for readmission.   Total time spent on this patient was 45 minutes.   ____________________________ Ellamae Sia. Sherryll Burger, MD vss:sp D: 01/15/2015 12:00:45 ET T: 01/15/2015 12:20:19 ET JOB#: [id number]  cc: Jamir Rone S. Sherryll Burger, MD, <Dictator> Leo Grosser, MD Stann Mainland. Sampson Goon, MD Ellamae Sia [facility] MD ELECTRONICALLY SIGNED 01/18/2015 10:31

## 2015-02-04 NOTE — H&P (Signed)
PATIENT NAME:  [name], WICKWIRE MR#:  [id number] DATE OF BIRTH:  1941/01/15  DATE OF ADMISSION:  01/09/2015  PRIMARY CARE PROVIDER: Lorie Phenix, MD   EMERGENCY DEPARTMENT REFERRING PHYSICIAN:  Dr. Mayford Knife  CHIEF COMPLAINT: Decrease in responsiveness, hypoxia.   HISTORY OF PRESENT ILLNESS: The patient is a 74 year old white female with multiple medical problems including COPD, obstructive sleep apnea, hypothyroidism, hypertension, diabetes, hyperlipidemia, diastolic congestive heart failure, history of tobacco abuse, who currently resides at [facility].  She was brought in with lethargy, decrease in responsiveness.  The patient also was noted to have wheezing when EMS picked her up. Sats were in the low 90s.  The patient also, after evaluation in the ED, was noted to have acute renal failure and hyperkalemia, as well as severe urinary tract infection.  Currently, she is on the ventilator for airway protection unable to provide me with any history.   PAST MEDICAL HISTORY:  1.  History of chronic obstructive pulmonary disease, asthma.  2.  Obstructive sleep apnea.  3.  Hypothyroidism.  4.  Hypertension.  5.  Diabetes.  6.  Hyperlipidemia.  7.  History of diastolic congestive heart failure with ejection fraction of 55% on echocardiogram in May 2015.  8.  History of tobacco abuse.  9.  Morbid obesity.  10. Coronary artery disease.  11. Paroxysmal atrial fibrillation on Xarelto.   ALLERGIES: LISTED AS LISINOPRIL, AMIODARONE; HOWEVER, THE PATIENT IS ON AMIODARONE CHRONICALLY.   FAMILY HISTORY: Positive for hypertension and diabetes.   REVIEW OF SYSTEMS:  Unobtainable. The patient currently is on the ventilator.   SOCIAL HISTORY:  Resident of [facility], history of smoking in the past.   CURRENT MEDICATIONS:  Xarelto 20 mg daily, vitamin D2 50,000 international units once monthly, Tylenol 650 every 6 p.r.n., Synthroid 200 mcg daily, Spiriva 18 mcg daily, Singulair 10 daily,  simvastatin 10 daily, Requip 2 mg once a day at bedtime, Requip 1 mg at 1 tab p.o. b.i.d., potassium chloride 10 mEq daily, potassium chloride 20 mEq once a day, oxycodone 5 mg every 4 p.r.n. for pain, Zofran 4 mg every 6 p.r.n., omeprazole 20 daily, melatonin 100 at 1 tab p.o. t.i.d., MiraLax 17 grams every 24 hours as needed, metoprolol tartrate 25, 1 tab p.o. daily, melatonin 5 mg daily, loperamide 2 mg every 6 p.r.n., Lasix 40 mg 1 tab p.o. b.i.d. as needed for weight greater than 245, guaifenesin  400 at 1 tab p.o. every 8 p.r.n. for cough, Glucophage 250 mg daily, Lasix 20 at 1 tabs p.o. b.i.d., Flomax 0.4 daily, Eucerin topical cream as needed, DuoNebs every 6 hours as needed for wheezing, duloxetine 60 mg 1 tab p.o. b.i.d., Colace 100 at 1 tab p.o. every 12, Clobetasol topically to affected area, bupropion 150 daily, Benadryl 25 every 6 p.r.n., aspirin 81 mg 1 tab p.o. daily, Artificial Tears 1 drop to each eye, every 6 hours, amiodarone 200 daily, Advair Diskus 1 puff b.i.d.   PHYSICAL EXAMINATION:  GENERAL:  The patient is currently ventilated, critically ill-appearing female.  VITAL SIGNS: Temperature 97.6, pulse 74, respirations 21, blood pressure 104/91.  HEENT: Head atraumatic, normocephalic. Pupils equally round, reactive to light and accommodation. There is no conjunctival pallor. No sclerae icterus. Nasal exam shows no drainage or ulceration.  OROPHARYNX: ET tube in place    NECK: Supple without any thyromegaly.  CARDIOVASCULAR: Regular rate and rhythm. No murmurs, rubs, clicks, or gallops.  LUNGS: On the ventilator. There is some expiratory wheezes. No rhonchi or  rales.  ABDOMEN: Obese, nontender, nondistended. Positive bowel sounds x 4. No hepatosplenomegaly.  EXTREMITIES: No clubbing, cyanosis, or edema.  SKIN: No rash.  LYMPH NODES: Nonpalpable.  MUSCULOSKELETAL: There is no erythema or swelling.  VASCULAR: Good DP/PT pulses.  PSYCHIATRIC: Currently intubated and sedated.   NEUROLOGIC: No intubated and sedated.  LYMPH NODES: Nonpalpable.   LABORATORY DATA: Glucose 277, BUN 34, creatinine 1.80, sodium 134, potassium 6.1, chloride 94, CO2 of 33, calcium was 8.6, phosphorus 7.8, magnesium 2. LFTs showed a total protein of 8.7, albumin 3.8, bilirubin total 0.2, alkaline phosphatase 111, AST 16, ALT 14 troponin less than 0.03. WBC 13.5, hemoglobin 13.3, platelet count 278,000.  INR is 1.8. Urinalysis shows leukocytes 2+, WBCs 9143.  ABG: PH of 7.33, pCO2 of 57, pO2 of 231, this is on ventilator. EKG sinus tachycardia.   ASSESSMENT AND PLAN: The patient is a 74 year old white female with history of chronic obstructive pulmonary disease, diastolic congestive heart failure, coronary artery disease, paroxysmal atrial fibrillation, currently at [facility] brought in with hypoxia, decrease in responsiveness.  1.  Acute encephalopathy, suspected due to acute hypercarbic respiratory failure, due to acute hypercarbic respiratory failure suspected based on elevated bicarbonate and decreased pCO2 on her ABG.   She also has a urinary tract infection, which could be causing her acute encephalopathy.  At this time, we will treat her with aggressive IV antibiotics, vent support. Wean the vent as tolerated.  2.  Sepsis due to urinary tract infection. We will treat her with IV Vancomycin and Zosyn until further cultures are back to broaden the antibiotics down.   3.  Acute renal failure. We will hold her Lasix, give her IV fluids. 4.  Hypokalemia, suspected due to acute renal failure and potassium supplements.  We will give her IV fluids. Monitor potassium.  5.  Acute on chronic obstructive pulmonary disease exacerbation. We will treat her with nebulizers and steroids. Pulmonary consult will be obtained.  6.  Atrial fibrillation, continue Xarelto.  Although her chart says that she is allergic to amiodarone, she is on chronic amiodarone. At this time, we will continue.  7.  Hypothyroidism.  Continue Synthroid.  8.  Diabetes. We will place her on sliding scale insulin.  9.  CODE STATUS is FULL.   TIME SPENT ON HISTORY AND PHYSICAL:  65 minutes of critical care time.     ____________________________ Lacie ScottsShreyang H. Allena KatzPatel, MD shp:DT D: 01/09/2015 10:40:15 ET T: 01/09/2015 11:17:09 ET JOB#: [id number]  cc: Uliana Brinker H. Allena KatzPatel, MD, <Dictator> Charise CarwinSHREYANG H Ja Pistole MD ELECTRONICALLY SIGNED 01/10/2015 20:21

## 2015-02-04 NOTE — Consult Note (Addendum)
PATIENT NAME:  [name], FEDORCHAK MR#:  [id number] DATE OF BIRTH:  1941/01/27  INFECTIOUS DISEASE CONSULTATION  DATE OF CONSULTATION:  01/15/2015.  REQUESTING PHYSICIAN:  Dr. Delfino Lovett.   REASON FOR CONSULTATION:  ESBL Escherichia coli urinary tract infection.   HISTORY OF PRESENT ILLNESS:  A very pleasant 74 year old female with a history of COPD, obstructive sleep apnea, diabetes, diastolic CHF, who resides at [facility].  She is largely unable to walk but does get up to a chair with assistance.  She has incontinence of urine and stool and wears Depends.  She does have a history of recurrent urinary tract infections; her last one being in August 2015.  She has followed with [facility] in the past but has not seen Michiel Cowboy there in several months.  She  denies taking suppressive antibiotics and is unsure of any other workup or treatment she has had for urinary tract infections.  She was admitted April 5 with decreased responsiveness and hypoxia.  She had become increasingly lethargic.  Her saturations were in the low 90s and she had wheezing.  She was also found to be in acute renal failure and have hyperkalemia.  Urinalysis was positive.  The patient was intubated for airway protection.  She has been since that time successfully extubated and is on the floor. She is getting ready for discharge.  Urinalysis, however, came back positive for ESBL Escherichia coli.   PAST MEDICAL HISTORY:  1. COPD.  2. OSA.  3. Hypothyroidism.  4. Hypertension.  5. Diabetes.  6. Hyperlipidemia.  7. Diastolic CHF.  8. Morbid obesity.  9. History of tobacco abuse.  10.  Coronary artery disease.  11.  Paroxysmal AFib, on Xarelto.     PAST SURGICAL HISTORY:  Unobtainable.   ALLERGIES:  LISINOPRIL, AMIODARONE.   FAMILY HISTORY:  Positive for hypertension, diabetes.    REVIEW OF SYSTEMS:  Eleven systems reviewed and negative except as per HPI.   SOCIAL HISTORY:  She is a resident of [facility].  She quit smoking in the past.  She is largely immobile and incontinent.    ANTIBIOTICS SINCE ADMISSION:  Include initially Zosyn from April 4 through April 9. This was changed to meropenem.  She was also on vancomycin April 4 through the 7th.    PHYSICAL EXAMINATION:  VITAL SIGNS:  Temperature 98.2, pulse 60, blood pressure 143/68, respirations 18, saturating 90% on 2 liters.  GENERAL:  She is obese, lying in bed in no acute distress.  HEENT:  Pupils reactive.  Sclerae anicteric.  Oropharynx clear.  NECK:  Supple.  HEART:  Regular.  LUNGS:  Coarse breath sounds bilaterally.  ABDOMEN:  Soft, obese, nontender, nondistended.  EXTREMITIES:  No clubbing, cyanosis, or edema.  NEUROLOGIC:  She is alert and oriented x 3.  Grossly nonfocal neurologic exam.   LABORATORY DATA:  Urine culture April 5 grew mixed bacterial organisms greater than 100,000.  Blood cultures 1 of 2 positive for coagulase-negative staphylococcus from April 5. Repeat urine culture April 6:  Indwelling catheter had 5000 Escherichia coli, 8000 providencia, and 2000 colonies of another gram-negative rod.  The Escherichia coli was an extended-spectrum beta-lactamase inhibitor that was sensitive to gentamicin, imipenem, and ertapenem. The providencia was sensitive to ceftriaxone, imipenem, Bactrim, and cefoxitin.  The third gram-negative rod was sensitive to ceftriaxone, imipenem, Bactrim and cefoxitin.  A repeat urine culture April 7 was no growth.  Clostridium difficile April 8 was negative.  Urinalysis done initially April 5 had 9143  white cells.  White blood count on admission was 13.5 on April 5.   Currently on April 11, it is 12.4.  Hemoglobin 11.1, platelets 314,000.  Renal function shows a creatinine of 0.86, down from 1.80 on admission.  LFTs were normal on admission.   IMPRESSION:  A 74 year old female largely bedbound with multiple medical problems.  She is  incontinent of urine and stool and has a history of recurrent  urinary tract infections with prior extended-spectrum beta-lactamase in August 2015.  She was admitted septic with confusion.  She was found to have an elevated white count and markedly positive urinalysis.  Culture initially grew greater than 100,000 mixed Gram negatives.   Further testing grew Escherichia coli,  providencia, and a third gram-negative rod.  Clinically she responded with fluids and with treatment with vancomycin and Zosyn.  She was changed to meropenem based on extended-spectrum beta-lactamase finding on April 9.  Clinically she is much improved and followup urine culture is negative.   RECOMMENDATIONS:  1. I would suggest we switch her to fosfomycin for 3 total doses 3 g every 3 days.  We have asked the microscopy lab to add on fosfomycin sensitivities.  2. We will need to [facility] who [facility] her before.  3. I can see in clinic in followup in 1-2 weeks to assess response and help work on prevention of further urinary tract infections.   Thank you for the consult.  I will be glad to follow with you.    ____________________________ Stann Mainlandavid P. Sampson GoonFitzgerald, MD dpf:kc D: 01/15/2015 14:31:42 ET T: 01/15/2015 15:05:38 ET JOB#: [id number]  cc: Stann Mainlandavid P. Sampson GoonFitzgerald, MD, <Dictator> Aedon Deason Sampson GoonFITZGERALD MD ELECTRONICALLY SIGNED 02/06/2015 10:16

## 2015-03-16 ENCOUNTER — Encounter: Payer: Medicare Other | Attending: Surgery | Admitting: Surgery

## 2015-03-16 DIAGNOSIS — J449 Chronic obstructive pulmonary disease, unspecified: Secondary | ICD-10-CM | POA: Diagnosis not present

## 2015-03-16 DIAGNOSIS — F329 Major depressive disorder, single episode, unspecified: Secondary | ICD-10-CM | POA: Diagnosis not present

## 2015-03-16 DIAGNOSIS — I2510 Atherosclerotic heart disease of native coronary artery without angina pectoris: Secondary | ICD-10-CM | POA: Insufficient documentation

## 2015-03-16 DIAGNOSIS — I480 Paroxysmal atrial fibrillation: Secondary | ICD-10-CM | POA: Insufficient documentation

## 2015-03-16 DIAGNOSIS — E11621 Type 2 diabetes mellitus with foot ulcer: Secondary | ICD-10-CM | POA: Diagnosis not present

## 2015-03-16 DIAGNOSIS — F17218 Nicotine dependence, cigarettes, with other nicotine-induced disorders: Secondary | ICD-10-CM | POA: Insufficient documentation

## 2015-03-16 DIAGNOSIS — E039 Hypothyroidism, unspecified: Secondary | ICD-10-CM | POA: Diagnosis not present

## 2015-03-16 DIAGNOSIS — G4730 Sleep apnea, unspecified: Secondary | ICD-10-CM | POA: Diagnosis not present

## 2015-03-16 DIAGNOSIS — E1142 Type 2 diabetes mellitus with diabetic polyneuropathy: Secondary | ICD-10-CM | POA: Diagnosis not present

## 2015-03-16 DIAGNOSIS — L89623 Pressure ulcer of left heel, stage 3: Secondary | ICD-10-CM | POA: Insufficient documentation

## 2015-03-16 DIAGNOSIS — I10 Essential (primary) hypertension: Secondary | ICD-10-CM | POA: Insufficient documentation

## 2015-03-16 NOTE — Progress Notes (Addendum)
[name], [name] ([id number]) Visit Report for 03/16/2015 Allergy List Details Patient Name: Traci, VIGLIONE [name]. Date of Service: 03/16/2015 2:30 PM Medical Record Number: [id number] Patient Account Number: [id number] Date of Birth/Sex: 05/06/1941 (74 y.o. Female) Treating RN: Curtis Sites Primary Care Physician: Lorie Phenix Other Clinician: Referring Physician: Lorie Phenix Treating Physician/Extender: Rudene Re in Treatment: 0 Allergies Active Allergies lisinopril Allergy Notes Electronic Signature(s) Signed: 03/16/2015 4:12:17 PM By: Curtis Sites Entered By: Curtis Sites on 03/16/2015 14:32:57 [name] ([id number]) -------------------------------------------------------------------------------- Arrival Information Details Patient Name: [name]. Date of Service: 03/16/2015 2:30 PM Medical Record Number: [id number] Patient Account Number: [id number] Date of Birth/Sex: [birth year] (74 y.o. Female) Treating RN: Curtis Sites Primary Care Physician: Lorie Phenix Other Clinician: Referring Physician: Lorie Phenix Treating Physician/Extender: Rudene Re in Treatment: 0 Visit Information Patient Arrived: Wheel Chair Arrival Time: 14:28 Accompanied By: self Transfer Assistance: None Patient Identification Verified: Yes Secondary Verification Process Yes Completed: Patient Has Alerts: Yes Patient Alerts: Patient on Blood Thinner DMII xarelto, asa Electronic Signature(s) Signed: 03/16/2015 4:12:17 PM By: Curtis Sites Entered By: Curtis Sites on 03/16/2015 14:30:05 [name] ([id number]) -------------------------------------------------------------------------------- Clinic Level of Care Assessment Details Patient Name: [name]. Date of Service: 03/16/2015 2:30 PM Medical Record Number: [id number] Patient Account Number: [id number] Date of Birth/Sex: 18-Jul-1941 (74 y.o. Female) Treating RN: Curtis Sites Primary Care  Physician: Lorie Phenix Other Clinician: Referring Physician: Lorie Phenix Treating Physician/Extender: Rudene Re in Treatment: 0 Clinic Level of Care Assessment Items TOOL 1 Quantity Score  - Use when EandM and Procedure is performed on INITIAL visit 0 ASSESSMENTS - Nursing Assessment / Reassessment X - General Physical Exam (combine w/ comprehensive assessment (listed just 1 20 below) when performed on new pt. evals) X - Comprehensive Assessment (HX, ROS, Risk Assessments, Wounds Hx, etc.) 1 25 ASSESSMENTS - Wound and Skin Assessment / Reassessment  - Dermatologic / Skin Assessment (not related to wound area) 0 ASSESSMENTS - Ostomy and/or Continence Assessment and Care  - Incontinence Assessment and Management 0  - Ostomy Care Assessment and Management (repouching, etc.) 0 PROCESS - Coordination of Care X - Simple Patient / Family Education for ongoing care 1 15  - Complex (extensive) Patient / Family Education for ongoing care 0 X - Staff obtains Chiropractor, Records, Test Results / Process Orders 1 10  - Staff telephones HHA, Nursing Homes / Clarify orders / etc 0  - Routine Transfer to another Facility (non-emergent condition) 0  - Routine Hospital Admission (non-emergent condition) 0 X - New Admissions / Manufacturing engineer / Ordering NPWT, Apligraf, etc. 1 15  - Emergency Hospital Admission (emergent condition) 0 PROCESS - Special Needs  - Pediatric / Minor Patient Management 0  - Isolation Patient Management 0 Mcclaren, Clydell [name]. ([id number])  - Hearing / Language / Visual special needs 0  - Assessment of Community assistance (transportation, D/C planning, etc.) 0  - Additional assistance / Altered mentation 0  - Support Surface(s) Assessment (bed, cushion, seat, etc.) 0 INTERVENTIONS - Miscellaneous  - External ear exam 0  - Patient Transfer (multiple staff / Nurse, adult / Similar devices) 0  - Simple Staple / Suture  removal (25 or less) 0  - Complex Staple / Suture removal (26 or more) 0  - Hypo/Hyperglycemic Management (do not check if billed separately) 0 X - Ankle / Brachial Index (ABI) - do not check if billed separately 1 15 Has the patient been seen at the hospital within the last three  years: Yes Total Score: 100 Level Of Care: New/Established - Level 3 Electronic Signature(s) Signed: 03/16/2015 3:57:47 PM By: Curtis Sites Entered By: Curtis Sites on 03/16/2015 15:57:47 [name] ([id number]) -------------------------------------------------------------------------------- Encounter Discharge Information Details Patient Name: [name]. Date of Service: 03/16/2015 2:30 PM Medical Record Number: [id number] Patient Account Number: [id number] Date of Birth/Sex: Jun 28, 1941 (74 y.o. Female) Treating RN: Primary Care Physician: Lorie Phenix Other Clinician: Referring Physician: Lorie Phenix Treating Physician/Extender: Rudene Re in Treatment: 0 Encounter Discharge Information Items Discharge Pain Level: 0 Discharge Condition: Stable Ambulatory Status: Wheelchair Discharge Destination: Nursing Home Transportation: Private Auto Accompanied By: self Schedule Follow-up Appointment: Yes Medication Reconciliation completed and provided to Patient/Care No [name]: Provided on Clinical Summary of Care: 03/16/2015 Form Type Recipient Paper Patient GG Electronic Signature(s) Signed: 03/16/2015 3:58:59 PM By: Curtis Sites Previous Signature: 03/16/2015 3:40:24 PM Version By: Gwenlyn Perking Entered By: Curtis Sites on 03/16/2015 15:58:59 [name] ([id number]) -------------------------------------------------------------------------------- Lower Extremity Assessment Details Patient Name: [name]. Date of Service: 03/16/2015 2:30 PM Medical Record Number: [id number] Patient Account Number: [id number] Date of Birth/Sex: 08/01/1941 (74 y.o.  Female) Treating RN: Curtis Sites Primary Care Physician: Lorie Phenix Other Clinician: Referring Physician: Lorie Phenix Treating Physician/Extender: Rudene Re in Treatment: 0 Edema Assessment Assessed: [Left: No] [Right: No] Edema: [Left: Yes] [Right: Yes] Calf Left: Right: Point of Measurement: 32 cm From Medial Instep 41 cm 44 cm Ankle Left: Right: Point of Measurement: 11 cm From Medial Instep 26.5 cm 27.4 cm Vascular Assessment Pulses: Posterior Tibial Palpable: [Left:Yes] [Right:Yes] Dorsalis Pedis Palpable: [Left:Yes] [Right:Yes] Extremity colors, hair growth, and conditions: Extremity Color: [Left:Normal] [Right:Normal] Hair Growth on Extremity: [Left:Yes] [Right:Yes] Temperature of Extremity: [Left:Warm] [Right:Warm] Capillary Refill: [Left:< 3 seconds] [Right:< 3 seconds] Blood Pressure: Brachial: [Left:88] [Right:118] Dorsalis Pedis: 128 [Left:Dorsalis Pedis: 118] Ankle: Posterior Tibial: 110 [Left:Posterior Tibial: 98 1.08] [Right:1.00] Toe Nail Assessment Left: Right: Thick: No No Discolored: No No Deformed: No No Improper Length and Hygiene: No No Electronic Signature(s) TYMESHA, DITMORE ([id number]) Signed: 03/16/2015 4:12:17 PM By: Curtis Sites Entered By: Curtis Sites on 03/16/2015 14:58:16 [name]. ([id number]) -------------------------------------------------------------------------------- Multi Wound Chart Details Patient Name: [name]. Date of Service: 03/16/2015 2:30 PM Medical Record Number: [id number] Patient Account Number: [id number] Date of Birth/Sex: 1941/01/27 (74 y.o. Female) Treating RN: Curtis Sites Primary Care Physician: Lorie Phenix Other Clinician: Referring Physician: Lorie Phenix Treating Physician/Extender: Rudene Re in Treatment: 0 Vital Signs Height(in): 62 Pulse(bpm): 66 Weight(lbs): 250 Blood Pressure 115/61 (mmHg): Body Mass Index(BMI): 46 Temperature(F):  98.6 Respiratory Rate 20 (breaths/min): Photos: [1:No Photos] [N/[name]:N/[name]] Wound Location: [1:Left Calcaneous] [N/[name]:N/[name]] Wounding Event: [1:Pressure Injury] [N/[name]:N/[name]] Primary Etiology: [1:Pressure Ulcer] [N/[name]:N/[name]] Comorbid History: [1:Anemia, Chronic Obstructive Pulmonary Disease (COPD), Sleep Apnea, Arrhythmia, Congestive Heart Failure, Coronary Artery Disease, Hypertension, Peripheral Venous Disease, Type II Diabetes, Osteoarthritis, Neuropathy] [N/[name]:N/[name]] Date Acquired: [1:02/12/2015] [N/[name]:N/[name]] Weeks of Treatment: [1:0] [N/[name]:N/[name]] Wound Status: [1:Open] [N/[name]:N/[name]] Measurements L x W x D 2.6x3.5x0.1 [N/[name]:N/[name]] (cm) Area (cm) : [1:7.147] [N/[name]:N/[name]] Volume (cm) : [1:0.715] [N/[name]:N/[name]] % Reduction in Area: [1:0.00%] [N/[name]:N/[name]] % Reduction in Volume: 0.00% [N/[name]:N/[name]] Classification: [1:Category/Stage III] [N/[name]:N/[name]] HBO Classification: [1:Grade 1] [N/[name]:N/[name]] Exudate Amount: [1:Large] [N/[name]:N/[name]] Exudate Type: [1:Serous] [N/[name]:N/[name]] Exudate Color: [1:amber] [N/[name]:N/[name]] Wound Margin: [1:Flat and Intact] [N/[name]:N/[name]] Granulation Amount: [1:Medium (34-66%)] [N/[name]:N/[name]] Granulation Quality: Red N/[name] N/[name] Necrotic Amount: Medium (34-66%) N/[name] N/[name] Exposed Structures: Fascia: No N/[name] N/[name] Fat: No Tendon: No Muscle: No Joint: No Bone: No Limited to Skin Breakdown Epithelialization: None N/[name] N/[name]  Periwound Skin Texture: Edema: No N/[name] N/[name] Excoriation: No Induration: No Callus: No Crepitus: No Fluctuance: No Friable: No Rash: No Scarring: No Periwound Skin Maceration: Yes N/[name] N/[name] Moisture: Moist: Yes Dry/Scaly: No Periwound Skin Color: Atrophie Blanche: No N/[name] N/[name] Cyanosis: No Ecchymosis: No Erythema: No Hemosiderin Staining: No Mottled: No Pallor: No Rubor: No Temperature: No Abnormality N/[name] N/[name] Tenderness on No N/[name] N/[name] Palpation: Wound Preparation: Ulcer Cleansing: N/[name] N/[name] Rinsed/Irrigated with Saline Topical Anesthetic Applied: Other: lidocaine 4% Treatment Notes Electronic  Signature(s) Signed: 03/16/2015 4:12:17 PM By: Curtis Sites Entered By: Curtis Sites on 03/16/2015 15:09:19 [name] ([id number]) -------------------------------------------------------------------------------- Multi-Disciplinary Care Plan Details Patient Name: [name]. Date of Service: 03/16/2015 2:30 PM Medical Record Number: [id number] Patient Account Number: [id number] Date of Birth/Sex: 24-Jun-1941 (74 y.o. Female) Treating RN: Curtis Sites Primary Care Physician: Lorie Phenix Other Clinician: Referring Physician: Lorie Phenix Treating Physician/Extender: Rudene Re in Treatment: 0 Active Inactive Abuse / Safety / Falls / Self Care Management Nursing Diagnoses: Potential for falls Goals: Patient will remain injury free Date Initiated: 03/16/2015 Goal Status: Active Interventions: Assess fall risk on admission and as needed Notes: Orientation to the Wound Care Program Nursing Diagnoses: Knowledge deficit related to the wound healing center program Goals: Patient/caregiver will verbalize understanding of the Wound Healing Center Program Date Initiated: 03/16/2015 Goal Status: Active Interventions: Provide education on orientation to the wound center Notes: Pressure Nursing Diagnoses: Potential for impaired tissue integrity related to pressure, friction, moisture, and shear Goals: Patient will remain free from development of additional pressure ulcers Date Initiated: 03/16/2015 [name] ([id number]) Goal Status: Active Interventions: Assess potential for pressure ulcer upon admission and as needed Notes: Wound/Skin Impairment Nursing Diagnoses: Impaired tissue integrity Goals: Ulcer/skin breakdown will have [name] volume reduction of 30% by week 4 Date Initiated: 03/16/2015 Goal Status: Active Interventions: Assess patient/caregiver ability to perform ulcer/skin care regimen upon admission and as needed Notes: Electronic  Signature(s) Signed: 03/16/2015 4:12:17 PM By: Curtis Sites Entered By: Curtis Sites on 03/16/2015 15:09:06 [name]. ([id number]) -------------------------------------------------------------------------------- Pain Assessment Details Patient Name: [name]. Date of Service: 03/16/2015 2:30 PM Medical Record Number: [id number] Patient Account Number: [id number] Date of Birth/Sex: December 26, 1940 (74 y.o. Female) Treating RN: Curtis Sites Primary Care Physician: Lorie Phenix Other Clinician: Referring Physician: Lorie Phenix Treating Physician/Extender: Rudene Re in Treatment: 0 Active Problems Location of Pain Severity and Description of Pain Patient Has Paino Yes Site Locations Pain Location: Pain in Ulcers With Dressing Change: Yes Duration of the Pain. Constant / Intermittento Constant Pain Management and Medication Current Pain Management: Electronic Signature(s) Signed: 03/16/2015 4:12:17 PM By: Curtis Sites Entered By: Curtis Sites on 03/16/2015 14:31:18 [name] ([id number]) -------------------------------------------------------------------------------- Patient/Caregiver Education Details Patient Name: [name]. Date of Service: 03/16/2015 2:30 PM Medical Record Number: [id number] Patient Account Number: [id number] Date of Birth/Gender: 1941-02-26 (74 y.o. Female) Treating RN: Curtis Sites Primary Care Physician: Lorie Phenix Other Clinician: Referring Physician: Lorie Phenix Treating Physician/Extender: Rudene Re in Treatment: 0 Education Assessment Education Provided To: Patient Education Topics Provided Pressure: Handouts: Other: sage boots Methods: Demonstration, Explain/Verbal Responses: State content correctly Wound/Skin Impairment: Handouts: Other: wound care as ordered Methods: Demonstration, Explain/Verbal Responses: State content correctly Electronic Signature(s) Signed: 03/16/2015  4:00:22 PM By: Curtis Sites Entered By: Curtis Sites on 03/16/2015 16:00:22 [name] ([id number]) -------------------------------------------------------------------------------- Wound Assessment Details Patient Name: [name]. Date of Service: 03/16/2015 2:30 PM Medical Record Number: [id number] Patient Account Number: [id number] Date of  Birth/Sex: [birth year] (73 y.o. Female) Treating RN: Curtis Sites Primary Care Physician: Lorie Phenix Other Clinician: Referring Physician: Lorie Phenix Treating Physician/Extender: Rudene Re in Treatment: 0 Wound Status Wound Number: 1 Primary Pressure Ulcer Etiology: Wound Location: Left Calcaneous Wound Open Wounding Event: Pressure Injury Status: Date Acquired: 02/12/2015 Comorbid Anemia, Chronic Obstructive Pulmonary Weeks Of Treatment: 0 History: Disease (COPD), Sleep Apnea, Clustered Wound: No Arrhythmia, Congestive Heart Failure, Coronary Artery Disease, Hypertension, Peripheral Venous Disease, Type II Diabetes, Osteoarthritis, Neuropathy Photos Wound Measurements Length: (cm) 2.6 Width: (cm) 3.5 Depth: (cm) 0.1 Area: (cm) 7.147 Volume: (cm) 0.715 % Reduction in Area: 0% % Reduction in Volume: 0% Epithelialization: None Tunneling: No Undermining: No Wound Description Classification: Category/Stage III Foul Odor [name] Diabetic Severity (Wagner): Grade 2 Wound Margin: Flat and Intact Exudate Amount: Large Exudate Type: Serous Exudate Color: amber fter Cleansing: No Wound Bed Granulation Amount: Medium (34-66%) Exposed Structure Cheadle, Jenette [name]. ([id number]) Granulation Quality: Red Fascia Exposed: No Necrotic Amount: Medium (34-66%) Fat Layer Exposed: No Necrotic Quality: Adherent Slough Tendon Exposed: No Muscle Exposed: No Joint Exposed: No Bone Exposed: No Limited to Skin Breakdown Periwound Skin Texture Texture Color No Abnormalities Noted: No No Abnormalities Noted: No Callus:  No Atrophie Blanche: No Crepitus: No Cyanosis: No Excoriation: No Ecchymosis: No Fluctuance: No Erythema: No Friable: No Hemosiderin Staining: No Induration: No Mottled: No Localized Edema: No Pallor: No Rash: No Rubor: No Scarring: No Temperature / Pain Moisture Temperature: No Abnormality No Abnormalities Noted: No Dry / Scaly: No Maceration: Yes Moist: Yes Wound Preparation Ulcer Cleansing: Rinsed/Irrigated with Saline Topical Anesthetic Applied: Other: lidocaine 4%, Treatment Notes Wound #1 (Left Calcaneous) 1. Cleansed with: Clean wound with Normal Saline 2. Anesthetic Topical Lidocaine 4% cream to wound bed prior to debridement 3. Peri-wound Care: Skin Prep 4. Dressing Applied: Aquacel Ag 5. Secondary Dressing Applied Bordered Foam Dressing Kerlix/Conform Notes stretch netting KINYA, MEINE ([id number]) Electronic Signature(s) Signed: 03/19/2015 9:50:21 AM By: Elliot Gurney RN, BSN, Kim RN, BSN Signed: 03/19/2015 5:12:42 PM By: Curtis Sites Previous Signature: 03/16/2015 4:12:17 PM Version By: Curtis Sites Entered By: Elliot Gurney RN, BSN, Kim on 03/19/2015 09:50:21 [name] ([id number]) -------------------------------------------------------------------------------- Vitals Details Patient Name: [name]. Date of Service: 03/16/2015 2:30 PM Medical Record Number: [id number] Patient Account Number: [id number] Date of Birth/Sex: 02/21/1941 (74 y.o. Female) Treating RN: Curtis Sites Primary Care Physician: Lorie Phenix Other Clinician: Referring Physician: Lorie Phenix Treating Physician/Extender: Rudene Re in Treatment: 0 Vital Signs Time Taken: 14:31 Temperature (F): 98.6 Height (in): 62 Pulse (bpm): 66 Source: Stated Respiratory Rate (breaths/min): 20 Weight (lbs): 250 Blood Pressure (mmHg): 115/61 Source: Stated Reference Range: 80 - 120 mg / dl Body Mass Index (BMI): 45.7 Electronic Signature(s) Signed: 03/16/2015  4:12:17 PM By: Curtis Sites Entered By: Curtis Sites on 03/16/2015 14:32:40

## 2015-03-16 NOTE — Progress Notes (Addendum)
NISHIKA, PARKHURST ([id number]) Visit Report for 03/16/2015 Chief Complaint Document Details Patient Name: [name], [name] Date of Service: 03/16/2015 2:30 PM Medical Record Number: [id number] Patient Account Number: [id number] Date of Birth/Sex: [birth year] (74 y.o. Female) Treating RN: Primary Care Physician: Lorie Phenix Other Clinician: Referring Physician: Lorie Phenix Treating Physician/Extender: Rudene Re in Treatment: 0 Information Obtained from: Patient Chief Complaint Patient presents to the wound care center for a consult due non healing wound. 74 year old patient who comes with the chief complaints of having a ulcerated area on her left heel for about a month. Electronic Signature(s) Signed: 03/16/2015 3:51:19 PM By: Evlyn Kanner MD, FACS Entered By: Evlyn Kanner on 03/16/2015 15:21:59 [name] ([id number]) -------------------------------------------------------------------------------- Debridement Details Patient Name: Traci Iha A. Date of Service: 03/16/2015 2:30 PM Medical Record Number: [id number] Patient Account Number: [id number] Date of Birth/Sex: 06-12-1941 (74 y.o. Female) Treating RN: Primary Care Physician: Lorie Phenix Other Clinician: Referring Physician: Lorie Phenix Treating Physician/Extender: Rudene Re in Treatment: 0 Debridement Performed for Wound #1 Left Calcaneous Assessment: Performed By: Physician Tristan Schroeder., MD Debridement: Debridement Pre-procedure Yes Verification/Time Out Taken: Start Time: 15:11 Pain Control: Lidocaine 4% Topical Solution Level: Skin/Subcutaneous Tissue Total Area Debrided (L x 2.6 (cm) x 3.5 (cm) = 9.1 (cm) W): Tissue and other Viable, Non-Viable, Eschar, Exudate, Fibrin/Slough, Skin, Subcutaneous material debrided: Instrument: Curette Bleeding: Minimum Hemostasis Achieved: Pressure End Time: 15:14 Procedural Pain: 0 Post Procedural Pain: 0 Response to Treatment:  Procedure was tolerated well Post Debridement Measurements of Total Wound Length: (cm) 2.9 Stage: Category/Stage III Width: (cm) 5.2 Depth: (cm) 0.1 Volume: (cm) 1.184 Electronic Signature(s) Signed: 03/16/2015 3:51:19 PM By: Evlyn Kanner MD, FACS Entered By: Evlyn Kanner on 03/16/2015 15:21:22 [name] ([id number]) -------------------------------------------------------------------------------- HPI Details Patient Name: Traci Iha A. Date of Service: 03/16/2015 2:30 PM Medical Record Number: [id number] Patient Account Number: [id number] Date of Birth/Sex: 01/04/1941 (74 y.o. Female) Treating RN: Primary Care Physician: Lorie Phenix Other Clinician: Referring Physician: Lorie Phenix Treating Physician/Extender: Rudene Re in Treatment: 0 History of Present Illness Location: bilateral pressure injuries to both heels, left more than right Quality: Patient reports No Pain. Severity: Patient states wound are getting worse. Duration: Patient has had the wound for > 1 months prior to seeking treatment at the wound center Context: The wound appeared gradually over time Modifying Factors: Consults to this date include:some local care as per the nursing home staff Associated Signs and Symptoms: Patient reports having:no sensation on her feet whatsoever HPI Description: 74 year old patient who is known to have several comorbidities including Diabetes mellitus, opoid dependence, cervical spondylosis, GERD, anemia, dermatitis, general debility and pressure ulcers of both heels is here for an opinion regarding the latter. She does not check her blood sugar every day and does not know the last blood values which were done about 2 weeks ago. she has been a smoker all her life and was smoking up to a pack and a half a day but now smokes 1 packet in 3 days. PAST MEDICAL HISTORY: DM type 2, COPD, OSA, hypothyroidism, hypertension, diabetes, hyperlipidemia, CHF, morbid  obesity, previous history of tobacco abuse, coronary artery disease, paroxysmal atrial fibrillation on Xarelto. Electronic Signature(s) Signed: 03/16/2015 3:51:19 PM By: Evlyn Kanner MD, FACS Entered By: Evlyn Kanner on 03/16/2015 15:25:09 [name] ([id number]) -------------------------------------------------------------------------------- Physical Exam Details Patient Name: Traci Iha A. Date of Service: 03/16/2015 2:30 PM Medical Record Number: [id number] Patient Account Number: [id number] Date of Birth/Sex: 1941-03-29 (74 y.o. Female) Treating  RN: Primary Care Physician: Lorie Phenix Other Clinician: Referring Physician: Lorie Phenix Treating Physician/Extender: Rudene Re in Treatment: 0 Constitutional . Pulse regular. Respirations normal and unlabored. Afebrile. . Eyes Nonicteric. Reactive to light. Ears, Nose, Mouth, and Throat Lips, teeth, and gums WNL.Marland Kitchen Moist mucosa without lesions . Neck supple and nontender. No palpable supraclavicular or cervical adenopathy. Normal sized without goiter. Respiratory WNL. No retractions.. Cardiovascular Pedal Pulses WNL. ABI on the left is 1.08 on the right is 1.0. No clubbing, cyanosis or edema. Gastrointestinal (GI) Abdomen without masses or tenderness.. No liver or spleen enlargement or tenderness.. Musculoskeletal Adexa without tenderness or enlargement.. Digits and nails w/o clubbing, cyanosis, infection, petechiae, ischemia, or inflammatory conditions.. Integumentary (Hair, Skin) she has no pressure ulceration on the right heel. On the left heel she has a stage III pressure ulceration with a lot of maceration surrounding it.. No crepitus or fluctuance. No peri-wound warmth or erythema. No masses.. Electronic Signature(s) Signed: 03/16/2015 3:51:19 PM By: Evlyn Kanner MD, FACS Entered By: Evlyn Kanner on 03/16/2015 15:26:34 [name]  ([id number]) -------------------------------------------------------------------------------- Physician Orders Details Patient Name: Traci Iha A. Date of Service: 03/16/2015 2:30 PM Medical Record Number: [id number] Patient Account Number: [id number] Date of Birth/Sex: [birth year] (74 y.o. 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 o May shower with protection. - may shower with dressing on and change dressing after shower Anesthetic Wound #1 Left Calcaneous o Topical Lidocaine 4% cream applied to wound bed prior to debridement Skin Barriers/Peri-Wound Care Wound #1 Left Calcaneous o Skin Prep Primary Wound Dressing Wound #1 Left Calcaneous o Aquacel Ag - or calcium alginate with silver equivalent Secondary Dressing Wound #1 Left Calcaneous o Conform/Kerlix - and stretch netting as needed to keep dressing intact o Boardered Foam Dressing Dressing Change Frequency Wound #1 Left Calcaneous o Other: - on shower days [name], [name] ([id number]) Follow-up Appointments Wound #1 Left Calcaneous o Return Appointment in 1 week. Electronic Signature(s) Signed: 03/16/2015 3:51:19 PM By: Evlyn Kanner MD, FACS Signed: 03/16/2015 4:12:17 PM By: Curtis Sites Entered By: Curtis Sites on 03/16/2015  15:31:25 [name] ([id number]) -------------------------------------------------------------------------------- Problem List Details Patient Name: Traci Iha A. Date of Service: 03/16/2015 2:30 PM Medical Record Number: [id number] Patient Account Number: [id number] Date of Birth/Sex: Mar 03, 1941 (74 y.o. Female) Treating RN: Primary Care Physician: Lorie Phenix Other Clinician: Referring Physician: Lorie Phenix Treating Physician/Extender: Rudene Re in Treatment: 0 Active Problems ICD-10 Encounter Code Description Active Date Diagnosis E11.621 Type 2 diabetes mellitus with foot ulcer 03/16/2015 Yes E08.42 Diabetes mellitus due to underlying condition with diabetic 03/16/2015 Yes polyneuropathy L89.623 Pressure ulcer of left heel, stage 3 03/16/2015 Yes E66.01 Morbid (severe) obesity due to excess calories 03/16/2015 Yes F17.218 Nicotine dependence, cigarettes, with other nicotine- 03/16/2015 Yes induced disorders Inactive Problems Resolved Problems Electronic Signature(s) Signed: 03/16/2015 3:51:19 PM By: Evlyn Kanner MD, FACS Entered By: Evlyn Kanner on 03/16/2015 15:25:28 [name] ([id number]) -------------------------------------------------------------------------------- Progress Note Details Patient Name: Traci Iha A. Date  of Service: 03/16/2015 2:30 PM Medical Record Number: [id number] Patient Account Number: [id number] Date of Birth/Sex: [birth year] (73 y.o. Female) Treating RN: Primary Care Physician: Lorie Phenix Other Clinician: Referring Physician: Lorie Phenix Treating Physician/Extender: Rudene Re in Treatment: 0 Subjective Chief Complaint Information obtained from Patient Patient presents to the wound care center for a consult due non healing wound. 74 year old patient who comes with the chief complaints of having a ulcerated area on her left heel for about a month. History of Present Illness (HPI) The following HPI  elements were documented for the patient's wound: Location: bilateral pressure injuries to both heels, left more than right Quality: Patient reports No Pain. Severity: Patient states wound are getting worse. Duration: Patient has had the wound for > 1 months prior to seeking treatment at the wound center Context: The wound appeared gradually over time Modifying Factors: Consults to this date include:some local care as per the nursing home staff Associated Signs and Symptoms: Patient reports having:no sensation on her feet whatsoever 74 year old patient who is known to have several comorbidities including Diabetes mellitus, opoid dependence, cervical spondylosis, GERD, anemia, dermatitis, general debility and pressure ulcers of both heels is here for an opinion regarding the latter. She does not check her blood sugar every day and does not know the last blood values which were done about 2 weeks ago. she has been a smoker all her life and was smoking up to a pack and a half a day but now smokes 1 packet in 3 days. PAST MEDICAL HISTORY: DM type 2, COPD, OSA, hypothyroidism, hypertension, diabetes, hyperlipidemia, CHF, morbid obesity, previous history of tobacco abuse, coronary artery disease, paroxysmal atrial fibrillation on Xarelto. Wound History Patient presents with 2 open wounds that have been present for approximately one month or more. Patient has been treating wounds in the following manner: ointment daily. Laboratory tests have not been performed in the last month. Patient reportedly has not tested positive for an antibiotic resistant organism. Patient reportedly has not tested positive for osteomyelitis. Patient History Information obtained from Patient. Allergies [name], [name] ([id number]) lisinopril Social History Current every day smoker, Marital Status - Widowed, Alcohol Use - Never, Drug Use - No History, Caffeine Use - Never. Medical History Eyes Denies history of  Cataracts, Glaucoma, Optic Neuritis Ear/Nose/Mouth/Throat Denies history of Chronic sinus problems/congestion, Middle ear problems Hematologic/Lymphatic Patient has history of Anemia Denies history of Hemophilia, Human Immunodeficiency Virus, Lymphedema, Sickle Cell Disease Respiratory Patient has history of Chronic Obstructive Pulmonary Disease (COPD), Sleep Apnea - CPAP Denies history of Aspiration, Asthma, Pneumothorax, Tuberculosis Cardiovascular Patient has history of Arrhythmia, Congestive Heart Failure, Coronary Artery Disease, Hypertension, Peripheral Venous Disease Denies history of Angina, Deep Vein Thrombosis, Hypotension, Myocardial Infarction, Peripheral Arterial Disease, Phlebitis, Vasculitis Gastrointestinal Denies history of Cirrhosis , Colitis, Crohn s, Hepatitis A, Hepatitis B, Hepatitis C Endocrine Patient has history of Type II Diabetes Denies history of Type I Diabetes Genitourinary Denies history of End Stage Renal Disease Immunological Denies history of Lupus Erythematosus, Raynaud s, Scleroderma Integumentary (Skin) Denies history of History of Burn, History of pressure wounds Musculoskeletal Patient has history of Osteoarthritis Denies history of Gout, Rheumatoid Arthritis, Osteomyelitis Neurologic Patient has history of Neuropathy Denies history of Dementia, Quadriplegia, Paraplegia, Seizure Disorder Oncologic Denies history of Received Chemotherapy, Received Radiation Psychiatric Denies history of Anorexia/bulimia, Confinement Anxiety Patient is treated with Oral Agents. Blood sugar is not tested. Hospitalization/Surgery History - 01/05/2015, [facility], pulmonary and UTI. Medical And Surgical History Notes Gastrointestinal [name], [name] (  [id number]) diverticuitis Musculoskeletal OP Psychiatric depression Review of Systems (ROS) Constitutional Symptoms (General Health) The patient has no complaints or symptoms. Eyes Complains or has symptoms of  Glasses / Contacts - glasses. Denies complaints or symptoms of Dry Eyes, Vision Changes. Ear/Nose/Mouth/Throat The patient has no complaints or symptoms. Hematologic/Lymphatic The patient has no complaints or symptoms. Respiratory The patient has no complaints or symptoms. Cardiovascular Complains or has symptoms of LE edema. Denies complaints or symptoms of Chest pain. Gastrointestinal The patient has no complaints or symptoms. Endocrine The patient has no complaints or symptoms. Genitourinary Complains or has symptoms of Incontinence/dribbling. Denies complaints or symptoms of Kidney failure/ Dialysis. Immunological The patient has no complaints or symptoms. Integumentary (Skin) The patient has no complaints or symptoms. Musculoskeletal Complains or has symptoms of Muscle Weakness. Denies complaints or symptoms of Muscle Pain. Neurologic The patient has no complaints or symptoms. Oncologic The patient has no complaints or symptoms. Psychiatric Complains or has symptoms of Anxiety. Denies complaints or symptoms of Claustrophobia. Medications; I have reviewed her list of her medications which include: Glucophage, Synthroid, magnesium oxide, omeprazole, potassium chloride, Lasix, oxycodone, gabapentin, Cymbalta, Coreg, vitamin D, melatonin, Singulair, aspirin, amiodarone, Lopressor, 02, Colace, Advair Diskus, Zofran, Hydrocerin cream. [name], [name] ([id number]) Objective Constitutional Pulse regular. Respirations normal and unlabored. Afebrile. Vitals Time Taken: 2:31 PM, Height: 62 in, Source: Stated, Weight: 250 lbs, Source: Stated, BMI: 45.7, Temperature: 98.6 F, Pulse: 66 bpm, Respiratory Rate: 20 breaths/min, Blood Pressure: 115/61 mmHg. Eyes Nonicteric. Reactive to light. Ears, Nose, Mouth, and Throat Lips, teeth, and gums WNL.Marland Kitchen Moist mucosa without lesions . Neck supple and nontender. No palpable supraclavicular or cervical adenopathy. Normal sized without  goiter. Respiratory WNL. No retractions.. Cardiovascular Pedal Pulses WNL. ABI on the left is 1.08 on the right is 1.0. No clubbing, cyanosis or edema. Gastrointestinal (GI) Abdomen without masses or tenderness.. No liver or spleen enlargement or tenderness.. Musculoskeletal Adexa without tenderness or enlargement.. Digits and nails w/o clubbing, cyanosis, infection, petechiae, ischemia, or inflammatory conditions.. Integumentary (Hair, Skin) she has no pressure ulceration on the right heel. On the left heel she has a stage III pressure ulceration with a lot of maceration surrounding it.. No crepitus or fluctuance. No peri-wound warmth or erythema. No masses.. Wound #1 status is Open. Original cause of wound was Pressure Injury. The wound is located on the Left Calcaneous. The wound measures 2.6cm length x 3.5cm width x 0.1cm depth; 7.147cm^2 area and 0.715cm^3 volume. The wound is limited to skin breakdown. There is no tunneling or undermining noted. There is a large amount of serous drainage noted. The wound margin is flat and intact. There is medium (34-66%) red granulation within the wound bed. There is a medium (34-66%) amount of necrotic tissue within the wound bed including Adherent Slough. The periwound skin appearance exhibited: Maceration, Moist. The periwound skin appearance did not exhibit: Callus, Crepitus, Excoriation, Fluctuance, Friable, Warga, Shavy A. ([id number]) Induration, Localized Edema, Rash, Scarring, Dry/Scaly, Atrophie Blanche, Cyanosis, Ecchymosis, Hemosiderin Staining, Mottled, Pallor, Rubor, Erythema. Periwound temperature was noted as No Abnormality. Assessment Active Problems ICD-10 E11.621 - Type 2 diabetes mellitus with foot ulcer E08.42 - Diabetes mellitus due to underlying condition with diabetic polyneuropathy L89.623 - Pressure ulcer of left heel, stage 3 E66.01 - Morbid (severe) obesity due to excess calories F17.218 - Nicotine dependence,  cigarettes, with other nicotine-induced disorders This patient with diabetes mellitus type 2 and significant neuropathy of her feet has a pressure ulcer of the left heel which is a  stage III and a significant amount of macerated skin. This is also Wagner grade 2 ulceration. After sharply debriding it to healthy tissue and controlling the bleeding with pressure I'm going to recommend silver alginate dressing and an appropriate heel dressing with foam. I will also recommend stage boots to be applied to her bilaterally 24/ 7, as she has no sensation and even while she transfers she injures this area. At night when she is in bed she should wear her stage boots and also use some method to elevate her heels off the bed. She is encouraged to control her diabetes and get appropriate labs including a hemoglobin A1c to be done as soon as possible. I have spent a considerable amount of time discussing with her the need to give up smoking completely and have suggested various methods she could use along with the help from her PCP to use nicotine patches so as to help her give up the habit. She will come back and see me next week. Procedures Wound #1 Wound #1 is a Pressure Ulcer located on the Left Calcaneous . There was a Skin/Subcutaneous Tissue Debridement ([id number]) debridement with total area of 9.1 sq cm performed by Tristan Schroeder., MD. with the following instrument(s): Curette to remove Viable and Non-Viable tissue/material including Exudate, Fibrin/Slough, Eschar, Skin, and Subcutaneous after achieving pain control using Lidocaine 4% Topical Goth, Amil A. ([id number]) Solution. A time out was conducted prior to the start of the procedure. A Minimum amount of bleeding was controlled with Pressure. The procedure was tolerated well with a pain level of 0 throughout and a pain level of 0 following the procedure. Post Debridement Measurements: 2.9cm length x 5.2cm width x 0.1cm depth; 1.184cm^3  volume. Post debridement Stage noted as Category/Stage III. Plan This patient with diabetes mellitus type 2 and significant neuropathy of her feet has a pressure ulcer of the left heel which is a stage III and a significant amount of macerated skin. This is also Wagner grade 2 ulceration. After sharply debriding it to healthy tissue and controlling the bleeding with pressure I'm going to recommend silver alginate dressing and an appropriate heel dressing with foam. I will also recommend stage boots to be applied to her bilaterally 24/ 7, as she has no sensation and even while she transfers she injures this area. At night when she is in bed she should wear her stage boots and also use some method to elevate her heels off the bed. She is encouraged to control her diabetes and get appropriate labs including a hemoglobin A1c to be done as soon as possible. I have spent a considerable amount of time discussing with her the need to give up smoking completely and have suggested various methods she could use along with the help from her PCP to use nicotine patches so as to help her give up the habit. She will come back and see me next week. Electronic Signature(s) Signed: 03/19/2015 4:43:13 PM By: Evlyn Kanner MD, FACS Previous Signature: 03/16/2015 3:51:19 PM Version By: Evlyn Kanner MD, FACS Entered By: Evlyn Kanner on 03/19/2015 16:41:15 [name] ([id number]) -------------------------------------------------------------------------------- ROS/PFSH Details Patient Name: Traci Iha A. Date of Service: 03/16/2015 2:30 PM Medical Record Number: [id number] Patient Account Number: [id number] Date of Birth/Sex: 26-Apr-1941 (74 y.o. Female) Treating RN: Primary Care Physician: Lorie Phenix Other Clinician: Referring Physician: Lorie Phenix Treating Physician/Extender: Rudene Re in Treatment: 0 Information Obtained From Patient Wound History Do you currently have one or  more open woundso  Yes How many open wounds do you currently haveo 2 Approximately how long have you had your woundso one month or more How have you been treating your wound(s) until nowo ointment daily Has your wound(s) ever healed and then re-openedo No Have you had any lab work done in the past montho No Have you tested positive for an antibiotic resistant organism (MRSA, VRE)o No Have you tested positive for osteomyelitis (bone infection)o No Eyes Complaints and Symptoms: Positive for: Glasses / Contacts - glasses Negative for: Dry Eyes; Vision Changes Medical History: Negative for: Cataracts; Glaucoma; Optic Neuritis Cardiovascular Complaints and Symptoms: Positive for: LE edema Negative for: Chest pain Medical History: Positive for: Arrhythmia; Congestive Heart Failure; Coronary Artery Disease; Hypertension; Peripheral Venous Disease Negative for: Angina; Deep Vein Thrombosis; Hypotension; Myocardial Infarction; Peripheral Arterial Disease; Phlebitis; Vasculitis Genitourinary Complaints and Symptoms: Positive for: Incontinence/dribbling Negative for: Kidney failure/ Dialysis Medical History: Negative for: End Stage Renal Disease [name], [name] ([id number]) Musculoskeletal Complaints and Symptoms: Positive for: Muscle Weakness Negative for: Muscle Pain Medical History: Positive for: Osteoarthritis Negative for: Gout; Rheumatoid Arthritis; Osteomyelitis Past Medical History Notes: OP Psychiatric Complaints and Symptoms: Positive for: Anxiety Negative for: Claustrophobia Medical History: Negative for: Anorexia/bulimia; Confinement Anxiety Past Medical History Notes: depression Constitutional Symptoms (General Health) Complaints and Symptoms: No Complaints or Symptoms Ear/Nose/Mouth/Throat Complaints and Symptoms: No Complaints or Symptoms Medical History: Negative for: Chronic sinus problems/congestion; Middle ear problems Hematologic/Lymphatic Complaints  and Symptoms: No Complaints or Symptoms Medical History: Positive for: Anemia Negative for: Hemophilia; Human Immunodeficiency Virus; Lymphedema; Sickle Cell Disease Respiratory Complaints and Symptoms: No Complaints or Symptoms Medical History: Positive for: Chronic Obstructive Pulmonary Disease (COPD); Sleep Apnea - CPAP [name], [name] ([id number]) Negative for: Aspiration; Asthma; Pneumothorax; Tuberculosis Gastrointestinal Complaints and Symptoms: No Complaints or Symptoms Medical History: Negative for: Cirrhosis ; Colitis; Crohnos; Hepatitis A; Hepatitis B; Hepatitis C Past Medical History Notes: diverticuitis Endocrine Complaints and Symptoms: No Complaints or Symptoms Medical History: Positive for: Type II Diabetes Negative for: Type I Diabetes Time with diabetes: 15 years Treated with: Oral agents Blood sugar tested every day: No Immunological Complaints and Symptoms: No Complaints or Symptoms Medical History: Negative for: Lupus Erythematosus; Raynaudos; Scleroderma Integumentary (Skin) Complaints and Symptoms: No Complaints or Symptoms Medical History: Negative for: History of Burn; History of pressure wounds Neurologic Complaints and Symptoms: No Complaints or Symptoms Medical History: Positive for: Neuropathy Negative for: Dementia; Quadriplegia; Paraplegia; Seizure Disorder Oncologic [name], [name] ([id number]) Complaints and Symptoms: No Complaints or Symptoms Medical History: Negative for: Received Chemotherapy; Received Radiation Hospitalization / Surgery History Name of Hospital Purpose of Hospitalization/Surgery Date [facility] pulmonary and UTI 01/05/2015 Family and Social History Current every day smoker; Marital Status - Widowed; Alcohol Use: Never; Drug Use: No History; Caffeine Use: Never; Financial Concerns: No; Food, Clothing or Shelter Needs: No; Support System Lacking: No; Transportation Concerns: No; Advanced Directives: Yes (Not  Provided); Patient does not want information on Advanced Directives; Medical Power of Attorney: Yes - [name] (Copy provided) Physician Affirmation I have reviewed and agree with the above information. Electronic Signature(s) Signed: 03/16/2015 3:51:19 PM By: Evlyn Kanner MD, FACS Entered By: Evlyn Kanner on 03/16/2015 15:03:21 [name] ([id number]) -------------------------------------------------------------------------------- SuperBill Details Patient Name: Traci Iha A. Date of Service: 03/16/2015 Medical Record Number: [id number] Patient Account Number: [id number] Date of Birth/Sex: 06-28-1941 (74 y.o. 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 L89.623 Pressure ulcer of left heel, stage 3 E66.01 Morbid (severe) obesity due to excess calories F17.218 Nicotine dependence, cigarettes, with other nicotine-induced disorders Facility Procedures CPT4 Code Description: 60454098 99213 - WOUND CARE VISIT-LEV 3 EST PT Modifier: Quantity: 1 CPT4 Code Description: 11914782 11042 - DEB SUBQ TISSUE 20 SQ CM/< ICD-10 Description Diagnosis E11.621 Type 2 diabetes mellitus with foot ulcer L89.623 Pressure ulcer of left heel, stage 3 E08.42 Diabetes mellitus due to underlying condition with Modifier: diabetic poly Quantity: 1 neuropathy CPT4 Code Description: 95621308 99406-SMOKING CESSATION 3-10MINS ICD-10 Description Diagnosis E11.621 Type 2 diabetes mellitus with foot ulcer F17.218 Nicotine dependence, cigarettes, with other nicotin Modifier: e-induced dis Quantity: 1 orders Physician Procedures CPT4 Code Description: 6578469 99204 - WC PHYS LEVEL 4 - NEW PT ICD-10  Description Diagnosis E11.621 Type 2 diabetes mellitus with foot ulcer E08.42 Diabetes mellitus due to underlying condition with 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 ([id number]) Modifier: diabetic poly ne-induced dis Quantity: 1 neuropathy orders Electronic Signature(s) Signed: 03/16/2015 3:57:59 PM By: Curtis Sites Signed: 03/19/2015 12:20:11 PM By: Evlyn Kanner MD, FACS Previous Signature: 03/16/2015 3:51:19 PM Version By: Evlyn Kanner MD, FACS Entered By: Curtis Sites on 03/16/2015 15:57:59

## 2015-03-16 NOTE — Progress Notes (Signed)
Traci, [name] ([id number]) Visit Report for 03/16/2015 Abuse/Suicide Risk Screen Details Patient Name: [name], [name] Date of Service: 03/16/2015 2:30 PM Medical Record Number: [id number] Patient Account Number: [id number] Date of Birth/Sex: 1941/01/11 (74 y.o. Female) Treating RN: Curtis Sites Primary Care Physician: Lorie Phenix Other Clinician: Referring Physician: Lorie Phenix Treating Physician/Extender: Rudene Re in Treatment: 0 Abuse/Suicide Risk Screen Items Answer ABUSE/SUICIDE RISK SCREEN: Has anyone close to you tried to hurt or harm you recentlyo No Do you feel uncomfortable with anyone in your familyo No Has anyone forced you do things that you didnot want to doo No Do you have any thoughts of harming yourselfo No Patient displays signs or symptoms of abuse and/or neglect. No Electronic Signature(s) Signed: 03/16/2015 4:12:17 PM By: Curtis Sites Entered By: Curtis Sites on 03/16/2015 14:44:33 Traci Iha A. ([id number]) -------------------------------------------------------------------------------- Activities of Daily Living Details Patient Name: Traci Iha A. Date of Service: 03/16/2015 2:30 PM Medical Record Number: [id number] Patient Account Number: [id number] Date of Birth/Sex: May 16, 1941 (74 y.o. Female) Treating RN: Curtis Sites Primary Care Physician: Lorie Phenix Other Clinician: Referring Physician: Lorie Phenix Treating Physician/Extender: Rudene Re in Treatment: 0 Activities of Daily Living Items Answer Activities of Daily Living (Please select one for each item) Drive Automobile Not Able Take Medications Need Assistance Use Telephone Completely Able Care for Appearance Need Assistance Use Toilet Need Assistance Bath / Shower Need Assistance Dress Self Need Assistance Feed Self Completely Able Walk Need Assistance Get In / Out Bed Need Assistance Housework Need Assistance Prepare Meals Need  Assistance Handle Money Need Assistance Shop for Self Need Assistance Electronic Signature(s) Signed: 03/16/2015 4:12:17 PM By: Curtis Sites Entered By: Curtis Sites on 03/16/2015 14:45:06 [name] ([id number]) -------------------------------------------------------------------------------- Education Assessment Details Patient Name: Traci Iha A. Date of Service: 03/16/2015 2:30 PM Medical Record Number: [id number] Patient Account Number: [id number] Date of Birth/Sex: 1941/09/28 (74 y.o. Female) Treating RN: Curtis Sites Primary Care Physician: Lorie Phenix Other Clinician: Referring Physician: Lorie Phenix Treating Physician/Extender: Rudene Re in Treatment: 0 Primary Learner Assessed: Patient Learning Preferences/Education Level/Primary Language Learning Preference: Explanation, Demonstration Highest Education Level: High School Preferred Language: English Cognitive Barrier Assessment/Beliefs Language Barrier: No Translator Needed: No Memory Deficit: No Emotional Barrier: No Cultural/Religious Beliefs Affecting Medical No Care: Physical Barrier Assessment Impaired Vision: No Impaired Hearing: No Decreased Hand dexterity: No Knowledge/Comprehension Assessment Knowledge Level: Medium Comprehension Level: Medium Ability to understand written Medium instructions: Ability to understand verbal Medium instructions: Motivation Assessment Anxiety Level: Calm Cooperation: Cooperative Education Importance: Acknowledges Need Interest in Health Problems: Asks Questions Perception: Coherent Willingness to Engage in Self- Medium Management Activities: Readiness to Engage in Self- Medium Management Activities: Electronic Signature(s) PAHOLA, KIENHOLZ ([id number]) Signed: 03/16/2015 4:12:17 PM By: Curtis Sites Entered By: Curtis Sites on 03/16/2015 14:45:38 [name]  ([id number]) -------------------------------------------------------------------------------- Fall Risk Assessment Details Patient Name: Traci Iha A. Date of Service: 03/16/2015 2:30 PM Medical Record Number: [id number] Patient Account Number: [id number] Date of Birth/Sex: [birth year] (74 y.o. Female) Treating RN: Curtis Sites Primary Care Physician: Lorie Phenix Other Clinician: Referring Physician: Lorie Phenix Treating Physician/Extender: Rudene Re in Treatment: 0 Fall Risk Assessment Items FALL RISK ASSESSMENT: History of falling - immediate or within 3 months 0 No Secondary diagnosis 0 No Ambulatory aid None/bed rest/wheelchair/nurse 0 Yes Crutches/cane/walker 0 No Furniture 0 No IV Access/Saline Lock 0 No Gait/Training Normal/bed rest/immobile 0 No Weak 10 Yes Impaired 0 No Mental Status Oriented to own ability 0 Yes Electronic Signature(s) Signed: 03/16/2015 4:12:17  PM By: Curtis Sites Entered By: Curtis Sites on 03/16/2015 14:46:03 [name] ([id number]) -------------------------------------------------------------------------------- Foot Assessment Details Patient Name: Traci Iha A. Date of Service: 03/16/2015 2:30 PM Medical Record Number: [id number] Patient Account Number: [id number] Date of Birth/Sex: 10-15-1940 (74 y.o. Female) Treating RN: Curtis Sites Primary Care Physician: Lorie Phenix Other Clinician: Referring Physician: Lorie Phenix Treating Physician/Extender: Rudene Re in Treatment: 0 Foot Assessment Items Site Locations + = Sensation present, - = Sensation absent, C = Callus, U = Ulcer R = Redness, W = Warmth, M = Maceration, PU = Pre-ulcerative lesion F = Fissure, S = Swelling, D = Dryness Assessment Right: Left: Other Deformity: No No Prior Foot Ulcer: No No Prior Amputation: No No Charcot Joint: No No Ambulatory Status: Non-ambulatory Assistance Device: Wheelchair Gait: Steady Electronic  Signature(s) Signed: 03/16/2015 4:12:17 PM By: Curtis Sites Entered By: Curtis Sites on 03/16/2015 14:49:01 [name] ([id number]) -------------------------------------------------------------------------------- Nutrition Risk Assessment Details Patient Name: Traci Iha A. Date of Service: 03/16/2015 2:30 PM Medical Record Number: [id number] Patient Account Number: [id number] Date of Birth/Sex: 10-01-1941 (74 y.o. Female) Treating RN: Curtis Sites Primary Care Physician: Lorie Phenix Other Clinician: Referring Physician: Lorie Phenix Treating Physician/Extender: Rudene Re in Treatment: 0 Height (in): 62 Weight (lbs): 250 Body Mass Index (BMI): 45.7 Nutrition Risk Assessment Items NUTRITION RISK SCREEN: I have an illness or condition that made me change the kind and/or 0 No amount of food I eat I eat fewer than two meals per day 0 No I eat few fruits and vegetables, or milk products 0 No I have three or more drinks of beer, liquor or wine almost every day 0 No I have tooth or mouth problems that make it hard for me to eat 0 No I don't always have enough money to buy the food I need 0 No I eat alone most of the time 0 No I take three or more different prescribed or over-the-counter drugs a 1 Yes day Without wanting to, I have lost or gained 10 pounds in the last six 0 No months I am not always physically able to shop, cook and/or feed myself 0 No Nutrition Protocols Good Risk Protocol Moderate Risk Protocol Electronic Signature(s) Signed: 03/16/2015 4:12:17 PM By: Curtis Sites Entered By: Curtis Sites on 03/16/2015 14:46:42

## 2015-03-22 ENCOUNTER — Encounter: Payer: Medicare Other | Admitting: Surgery

## 2015-03-22 DIAGNOSIS — L89623 Pressure ulcer of left heel, stage 3: Secondary | ICD-10-CM | POA: Diagnosis not present

## 2015-03-22 NOTE — Progress Notes (Signed)
Traci, [name] ([id number]) Visit Report for 03/22/2015 Chief Complaint Document Details Patient Name: [name], [name] Date of Service: 03/22/2015 2:30 PM Medical Record Number: [id number] Patient Account Number: [id number] Date of Birth/Sex: 03-17-1941 (74 y.o. Female) Treating RN: Primary Care Physician: Lorie Phenix Other Clinician: Referring Physician: Lorie Phenix Treating Physician/Extender: Rudene Re in Treatment: 0 Information Obtained from: Patient Chief Complaint Patient presents to the wound care center for a consult due non healing wound. 74 year old patient who comes with the chief complaints of having a ulcerated area on her left heel for about a month. Electronic Signature(s) Signed: 03/22/2015 4:07:07 PM By: Traci Kanner MD, FACS Entered By: [name] on 03/22/2015 15:03:31 [name] ([id number]) -------------------------------------------------------------------------------- HPI Details Patient Name: Traci Iha A. Date of Service: 03/22/2015 2:30 PM Medical Record Number: [id number] Patient Account Number: [id number] Date of Birth/Sex: May 17, 1941 (74 y.o. Female) Treating RN: Primary Care Physician: Lorie Phenix Other Clinician: Referring Physician: Lorie Phenix Treating Physician/Extender: Rudene Re in Treatment: 0 History of Present Illness Location: bilateral pressure injuries to both heels, left more than right Quality: Patient reports No Pain. Severity: Patient states wound are getting worse. Duration: Patient has had the wound for > 1 months prior to seeking treatment at the wound center Context: The wound appeared gradually over time Modifying Factors: Consults to this date include:some local care as per the nursing home staff Associated Signs and Symptoms: Patient reports having:no sensation on her feet whatsoever HPI Description: 74 year old patient who is known to have several comorbidities including  Diabetes mellitus, opoid dependence, cervical spondylosis, GERD, anemia, dermatitis, general debility and pressure ulcers of both heels is here for an opinion regarding the latter. She does not check her blood sugar every day and does not know the last blood values which were done about 2 weeks ago. she has been a smoker all her life and was smoking up to a pack and a half a day but now smokes 1 packet in 3 days. PAST MEDICAL HISTORY: DM type 2, COPD, OSA, hypothyroidism, hypertension, diabetes, hyperlipidemia, CHF, morbid obesity, previous history of tobacco abuse, coronary artery disease, paroxysmal atrial fibrillation on Xarelto. Electronic Signature(s) Signed: 03/22/2015 4:07:07 PM By: Traci Kanner MD, FACS Entered By: [name] on 03/22/2015 15:03:37 [name] ([id number]) -------------------------------------------------------------------------------- Physical Exam Details Patient Name: Traci Iha A. Date of Service: 03/22/2015 2:30 PM Medical Record Number: [id number] Patient Account Number: [id number] Date of Birth/Sex: 26-Jun-1941 (74 y.o. Female) Treating RN: Primary Care Physician: Lorie Phenix Other Clinician: Referring Physician: Lorie Phenix Treating Physician/Extender: Rudene Re in Treatment: 0 Constitutional . Pulse regular. Respirations normal and unlabored. Afebrile. . Eyes Nonicteric. Reactive to light. Ears, Nose, Mouth, and Throat Lips, teeth, and gums WNL.Marland Kitchen Moist mucosa without lesions . Neck supple and nontender. No palpable supraclavicular or cervical adenopathy. Normal sized without goiter. Respiratory WNL. No retractions.. Cardiovascular Pedal Pulses WNL. No clubbing, cyanosis or edema. Musculoskeletal unable to walk due to her neuropathy. Digits and nails w/o clubbing, cyanosis, infection, petechiae, ischemia, or inflammatory conditions.. Integumentary (Hair, Skin) the ulcer on the left calcaneal region is fairly clean and  has some healthy granulation tissue.Marland Kitchen No crepitus or fluctuance. No peri-wound warmth or erythema. No masses.Marland Kitchen Psychiatric Judgement and insight Intact.. No evidence of depression, anxiety, or agitation.. Electronic Signature(s) Signed: 03/22/2015 4:07:07 PM By: Traci Kanner MD, FACS Entered By: [name] on 03/22/2015 15:04:25 [name] ([id number]) -------------------------------------------------------------------------------- Physician Orders Details Patient Name: Traci Iha A. Date of Service: 03/22/2015 2:30 PM Medical  Record Patient Account Number: [id number] Number: [name], RN, BSN, Treating RN: Oct 19, 1940 (73 y.o. [name] Date of Birth/Sex: Female) Other Clinician: Primary Care Physician: Lorie Phenix Treating [name] Referring Physician: Lorie Phenix Physician/Extender: Tania Ade in Treatment: 0 Verbal / Phone Orders: Yes Clinician: [name], RN, BSN, Rita Read Back and Verified: Yes Diagnosis Coding Wound Cleansing Wound #1 Left Calcaneous o Clean wound with Normal Saline. o May shower with protection. - may shower with dressing on and change dressing after shower Anesthetic Wound #1 Left Calcaneous o Topical Lidocaine 4% cream applied to wound bed prior to debridement Skin Barriers/Peri-Wound Care Wound #1 Left Calcaneous o Skin Prep Primary Wound Dressing Wound #1 Left Calcaneous o Aquacel Ag - or calcium alginate with silver equivalent Secondary Dressing Wound #1 Left Calcaneous o Conform/Kerlix - and stretch netting as needed to keep dressing intact o Boardered Foam Dressing Dressing Change Frequency Wound #1 Left Calcaneous o Other: - on shower days Follow-up Appointments Wound #1 Left Calcaneous o Return Appointment in 1 week. Electronic Signature(s) Signed: 03/22/2015 3:17:58 PM By: Elpidio Eric BSN, RN [name] ([id number]) Signed: 03/22/2015 4:07:07 PM By: Traci Kanner MD, FACS Entered By: Elpidio Eric  on 03/22/2015 15:17:58 [name] ([id number]) -------------------------------------------------------------------------------- Problem List Details Patient Name: Traci Iha A. Date of Service: 03/22/2015 2:30 PM Medical Record Number: [id number] Patient Account Number: [id number] Date of Birth/Sex: [birth year] (74 y.o. 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 Date of Service: 03/22/2015 2:30 PM Medical Record Number: [id number] Patient Account Number: [id number] Date of Birth/Sex: [birth year] (74 y.o. Female) Treating RN: Primary Care Physician: Lorie Phenix Other Clinician: Referring Physician: Lorie Phenix Treating Physician/Extender: Rudene Re in Treatment: 0 Subjective Chief Complaint Information obtained from Patient Patient presents to the wound care center for a consult due non healing wound. 74 year old patient who comes with the chief complaints of having a ulcerated area on her left heel for about a month. History of Present Illness (HPI) The  following HPI elements were documented for the patient's wound: Location: bilateral pressure injuries to both heels, left more than right Quality: Patient reports No Pain. Severity: Patient states wound are getting worse. Duration: Patient has had the wound for > 1 months prior to seeking treatment at the wound center Context: The wound appeared gradually over time Modifying Factors: Consults to this date include:some local care as per the nursing home staff Associated Signs and Symptoms: Patient reports having:no sensation on her feet whatsoever 74 year old patient who is known to have several comorbidities including Diabetes mellitus, opoid dependence, cervical spondylosis, GERD, anemia, dermatitis, general debility and pressure ulcers of both heels is here for an opinion regarding the latter. She does not check her blood sugar every day and does not know the last blood values which were done about 2 weeks ago. she has been a smoker all her  life and was smoking up to a pack and a half a day but now smokes 1 packet in 3 days. PAST MEDICAL HISTORY: DM type 2, COPD, OSA, hypothyroidism, hypertension, diabetes, hyperlipidemia, CHF, morbid obesity, previous history of tobacco abuse, coronary artery disease, paroxysmal atrial fibrillation on Xarelto. Objective Constitutional Pulse regular. Respirations normal and unlabored. Afebrile. Vitals Time Taken: 2:50 PM, Height: 62 in, Weight: 250 lbs, BMI: 45.7, Temperature: 98.4 F, Pulse: 65 Bastin, Kaveri A. ([id number]) bpm, Respiratory Rate: 20 breaths/min, Blood Pressure: 138/47 mmHg. Eyes Nonicteric. Reactive to light. Ears, Nose, Mouth, and Throat Lips, teeth, and gums WNL.Marland Kitchen Moist mucosa without lesions . Neck supple and nontender. No palpable supraclavicular or cervical adenopathy. Normal sized without goiter. Respiratory WNL. No retractions.. Cardiovascular Pedal Pulses WNL. No clubbing, cyanosis or edema. Musculoskeletal unable to  walk due to her neuropathy. Digits and nails w/o clubbing, cyanosis, infection, petechiae, ischemia, or inflammatory conditions.Marland Kitchen Psychiatric Judgement and insight Intact.. No evidence of depression, anxiety, or agitation.. Integumentary (Hair, Skin) the ulcer on the left calcaneal region is fairly clean and has some healthy granulation tissue.Marland Kitchen No crepitus or fluctuance. No peri-wound warmth or erythema. No masses.. Wound #1 status is Open. Original cause of wound was Pressure Injury. The wound is located on the Left Calcaneous. The wound measures 2.1cm length x 3.3cm width x 0.1cm depth; 5.443cm^2 area and 0.544cm^3 volume. The wound is limited to skin breakdown. There is no tunneling or undermining noted. There is a large amount of serous drainage noted. The wound margin is flat and intact. There is medium (34-66%) red granulation within the wound bed. There is a medium (34-66%) amount of necrotic tissue within the wound bed including Adherent Slough. The periwound skin appearance exhibited: Maceration, Moist. The periwound skin appearance did not exhibit: Callus, Crepitus, Excoriation, Fluctuance, Friable, Induration, Localized Edema, Rash, Scarring, Dry/Scaly, Atrophie Blanche, Cyanosis, Ecchymosis, Hemosiderin Staining, Mottled, Pallor, Rubor, Erythema. Periwound temperature was noted as No Abnormality. Assessment Active Problems ICD-10 RIYANNA, CRUTCHLEY A. ([id number]) E11.621 - Type 2 diabetes mellitus with foot ulcer E08.42 - Diabetes mellitus due to underlying condition with diabetic polyneuropathy L89.623 - Pressure ulcer of left heel, stage 3 E66.01 - Morbid (severe) obesity due to excess calories F17.218 - Nicotine dependence, cigarettes, with other nicotine-induced disorders I have asked her to continue to offload as much as possible so that she takes pressure off this area and she says she is doing that well. We will continue with silver alginate dressings and we will see her  back next w eek. She says that diabetes is under control. She continues to smoke 5-6 cigarettes a day and I have again urged her to give this up completely. Plan Wound Cleansing: Wound #1 Left Calcaneous: Clean wound with Normal Saline. May shower with protection. - may shower with dressing on and change dressing after shower Anesthetic: Wound #1 Left Calcaneous: Topical Lidocaine 4% cream applied to wound bed prior to debridement Skin Barriers/Peri-Wound Care: Wound #1 Left Calcaneous: Skin Prep Primary Wound Dressing: Wound #1 Left Calcaneous: Aquacel Ag - or calcium alginate with silver equivalent Secondary Dressing: Wound #1 Left Calcaneous: Conform/Kerlix - and stretch netting as needed to keep dressing intact Boardered Foam Dressing Dressing Change Frequency: Wound #1 Left Calcaneous: Other: - on shower days Follow-up Appointments: Wound #1 Left Calcaneous: Return Appointment in 1 week. ADEL, NEYER A. ([id number]) I have asked her to continue to offload as much as possible so that she takes pressure off this area and she says she is doing  that well. We will continue with silver alginate dressings and we will see her back next w eek. She says that diabetes is under control. She continues to smoke 5-6 cigarettes a day and I have again urged her to give this up completely. Electronic Signature(s) Signed: 03/22/2015 4:11:14 PM By: Traci Kanner MD, FACS Previous Signature: 03/22/2015 4:07:07 PM Version By: Traci Kanner MD, FACS Entered By: [name] on 03/22/2015 16:09:34 [name] ([id number]) -------------------------------------------------------------------------------- SuperBill Details Patient Name: Traci Iha A. Date of Service: 03/22/2015 Medical Record Patient Account Number: [id number] Number: [name], RN, BSN, Treating RN: [birth year] (74 y.o. 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 L89.623 Pressure ulcer of left heel, stage 3 E66.01 Morbid (severe) obesity due to excess calories F17.218 Nicotine dependence, cigarettes, with other nicotine-induced disorders Facility Procedures CPT4 Code: [id number] Description: 99213 - WOUND CARE VISIT-LEV 3 EST PT Modifier: Quantity: 1 Physician Procedures CPT4 Code Description: [id number] 99213 - WC PHYS LEVEL 3 - EST PT ICD-10 Description Diagnosis E11.621 Type 2 diabetes mellitus with foot ulcer E08.42 Diabetes mellitus due to underlying condition with L89.623 Pressure ulcer of left heel, stage 3 F17.218  Nicotine dependence, cigarettes, with other nicoti Modifier: diabetic polyn ne-induced diso Quantity: 1 europathy rders Electronic Signature(s) Signed: 03/22/2015 4:07:07 PM By: Traci Kanner MD, FACS Entered By: [name] on 03/22/2015 15:17:31

## 2015-03-22 NOTE — Progress Notes (Signed)
[name] [name], [name] [name] ([id number]) Visit Report for 03/22/2015 Arrival Information Details Patient Name: [name], [name] A. Date of Service: 03/22/2015 2:30 PM Medical Record Number: [id number] Patient Account Number: [id number] Date of Birth/Sex: 04-20-1941 (74 y.o. Female) Treating RN: [name], RN, BSN, [place] Sink Primary Care Physician: Lorie Phenix Other Clinician: Referring Physician: Lorie Phenix Treating Physician/Extender: Rudene Re in Treatment: 0 Visit Information History Since Last Visit Any new allergies or adverse reactions: No Patient Arrived: Wheel Chair Had a fall or experienced change in No Arrival Time: 14:48 activities of daily living that may affect Accompanied By: SELF risk of falls: Transfer Assistance: Manual Signs or symptoms of abuse/neglect since last No Patient Identification Verified: Yes visito Secondary Verification Process Yes Hospitalized since last visit: No Completed: Pain Present Now: No Patient Has Alerts: Yes Patient Alerts: Patient on Blood Thinner DMII xarelto, asa Electronic Signature(s) Signed: 03/22/2015 4:04:56 PM By: Elpidio Eric BSN, RN Entered By: Elpidio Eric on 03/22/2015 14:50:20 [name] ([id number]) -------------------------------------------------------------------------------- Clinic Level of Care Assessment Details Patient Name: [name] Iha A. Date of Service: 03/22/2015 2:30 PM Medical Record Number: [id number] Patient Account Number: [id number] Date of Birth/Sex: 09/04/1941 (74 y.o. Female) Treating RN: [name], RN, BSN, Rita Primary Care Physician: Lorie Phenix Other Clinician: Referring Physician: Lorie Phenix Treating Physician/Extender: Rudene Re in Treatment: 0 Clinic Level of Care Assessment Items TOOL 4 Quantity Score []  - Use when only an EandM is performed on FOLLOW-UP visit 0 ASSESSMENTS - Nursing Assessment / Reassessment X - Reassessment of Co-morbidities (includes updates in patient  status) 1 10 X - Reassessment of Adherence to Treatment Plan 1 5 ASSESSMENTS - Wound and Skin Assessment / Reassessment X - Simple Wound Assessment / Reassessment - one wound 1 5 []  - Complex Wound Assessment / Reassessment - multiple wounds 0 []  - Dermatologic / Skin Assessment (not related to wound area) 0 ASSESSMENTS - Focused Assessment []  - Circumferential Edema Measurements - multi extremities 0 []  - Nutritional Assessment / Counseling / Intervention 0 []  - Lower Extremity Assessment (monofilament, tuning fork, pulses) 0 []  - Peripheral Arterial Disease Assessment (using hand held doppler) 0 ASSESSMENTS - Ostomy and/or Continence Assessment and Care []  - Incontinence Assessment and Management 0 []  - Ostomy Care Assessment and Management (repouching, etc.) 0 PROCESS - Coordination of Care X - Simple Patient / Family Education for ongoing care 1 15 []  - Complex (extensive) Patient / Family Education for ongoing care 0 X - Staff obtains Chiropractor, Records, Test Results / Process Orders 1 10 []  - Staff telephones HHA, Nursing Homes / Clarify orders / etc 0 []  - Routine Transfer to another Facility (non-emergent condition) 0 Cardon, Ryana A. ([id number]) []  - Routine Hospital Admission (non-emergent condition) 0 []  - New Admissions / Manufacturing engineer / Ordering NPWT, Apligraf, etc. 0 []  - Emergency Hospital Admission (emergent condition) 0 X - Simple Discharge Coordination 1 10 []  - Complex (extensive) Discharge Coordination 0 PROCESS - Special Needs []  - Pediatric / Minor Patient Management 0 []  - Isolation Patient Management 0 []  - Hearing / Language / Visual special needs 0 []  - Assessment of Community assistance (transportation, D/C planning, etc.) 0 []  - Additional assistance / Altered mentation 0 []  - Support Surface(s) Assessment (bed, cushion, seat, etc.) 0 INTERVENTIONS - Wound Cleansing / Measurement X - Simple Wound Cleansing - one wound 1 5 []  - Complex Wound  Cleansing - multiple wounds 0 X - Wound Imaging (photographs - any number of wounds) 1 5 []  - Wound Tracing (  instead of photographs) 0 X - Simple Wound Measurement - one wound 1 5  - Complex Wound Measurement - multiple wounds 0 INTERVENTIONS - Wound Dressings X - Small Wound Dressing one or multiple wounds 1 10  - Medium Wound Dressing one or multiple wounds 0  - Large Wound Dressing one or multiple wounds 0  - Application of Medications - topical 0  - Application of Medications - injection 0 INTERVENTIONS - Miscellaneous  - External ear exam 0 [name], [name] ([id number])  - Specimen Collection (cultures, biopsies, blood, body fluids, etc.) 0  - Specimen(s) / Culture(s) sent or taken to Lab for analysis 0  - Patient Transfer (multiple staff / Michiel Sites Lift / Similar devices) 0  - Simple Staple / Suture removal (25 or less) 0  - Complex Staple / Suture removal (26 or more) 0  - Hypo / Hyperglycemic Management (close monitor of Blood Glucose) 0  - Ankle / Brachial Index (ABI) - do not check if billed separately 0 X - Vital Signs 1 5 Has the patient been seen at the hospital within the last three years: Yes Total Score: 85 Level Of Care: New/Established - Level 3 Electronic Signature(s) Signed: 03/22/2015 4:04:56 PM By: Elpidio Eric BSN, RN Entered By: Elpidio Eric on 03/22/2015 15:00:31 [name] ([id number]) -------------------------------------------------------------------------------- Encounter Discharge Information Details Patient Name: [name] Iha A. Date of Service: 03/22/2015 2:30 PM Medical Record Number: [id number] Patient Account Number: [id number] Date of Birth/Sex: 05/24/1941 (74 y.o. Female) Treating RN: Clover Mealy, RN, BSN, [place] Sink Primary Care Physician: Lorie Phenix Other Clinician: Referring Physician: Lorie Phenix Treating Physician/Extender: Rudene Re in Treatment: 0 Encounter Discharge Information Items Discharge Pain  Level: 0 Discharge Condition: Stable Ambulatory Status: Wheelchair Discharge Destination: Nursing Home Transportation: Other Schedule Follow-up Appointment: No Medication Reconciliation completed and provided to Patient/Care No Shawn Dannenberg: Provided on Clinical Summary of Care: 03/22/2015 Form Type Recipient Paper Patient GG Electronic Signature(s) Signed: 03/22/2015 4:04:56 PM By: Elpidio Eric BSN, RN Previous Signature: 03/22/2015 3:14:50 PM Version By: Gwenlyn Perking Entered By: Elpidio Eric on 03/22/2015 15:17:06 [name] Iha A. ([id number]) -------------------------------------------------------------------------------- Lower Extremity Assessment Details Patient Name: [name] Iha A. Date of Service: 03/22/2015 2:30 PM Medical Record Number: [id number] Patient Account Number: [id number] Date of Birth/Sex: 1941/06/19 (74 y.o. Female) Treating RN: [name], RN, BSN, [place] Sink Primary Care Physician: Lorie Phenix Other Clinician: Referring Physician: Lorie Phenix Treating Physician/Extender: Rudene Re in Treatment: 0 Edema Assessment Assessed: Kyra Searles: No] [Right: No] E[Left: dema] [Right: :] Calf Left: Right: Point of Measurement: 32 cm From Medial Instep cm cm Ankle Left: Right: Point of Measurement: 11 cm From Medial Instep cm cm Vascular Assessment Pulses: Posterior Tibial Dorsalis Pedis Palpable: [Left:Yes] Extremity colors, hair growth, and conditions: Extremity Color: [Left:Normal] Hair Growth on Extremity: [Left:No] Temperature of Extremity: [Left:Warm] Capillary Refill: [Left:< 3 seconds] Dependent Rubor: [Left:No] Blanched when Elevated: [Left:No] Lipodermatosclerosis: [Left:No] Toe Nail Assessment Left: Right: Thick: Yes Discolored: Yes Deformed: No Improper Length and Hygiene: Yes Electronic Signature(s) Signed: 03/22/2015 4:04:56 PM By: Elpidio Eric BSN, RN Frei, [place] A. ([id number]) Entered By: Elpidio Eric on 03/22/2015 14:51:45 [name] Iha A.  ([id number]) -------------------------------------------------------------------------------- Multi Wound Chart Details Patient Name: [name] Iha A. Date of Service: 03/22/2015 2:30 PM Medical Record Number: [id number] Patient Account Number: [id number] Date of Birth/Sex: 23-Jul-1941 (74 y.o. Female) Treating RN: Clover Mealy, RN, BSN, [place] Sink Primary Care Physician: Lorie Phenix Other Clinician: Referring Physician: Lorie Phenix Treating Physician/Extender: Rudene Re in Treatment: 0 Vital Signs Height(in): 62 Pulse(bpm): 65 Weight(lbs): 250  Blood Pressure 138/47 (mmHg): Body Mass Index(BMI): 46 Temperature(F): 98.4 Respiratory Rate 20 (breaths/min): Photos: [1:No Photos] [N/A:N/A] Wound Location: [1:Left Calcaneous] [N/A:N/A] Wounding Event: [1:Pressure Injury] [N/A:N/A] Primary Etiology: [1:Pressure Ulcer] [N/A:N/A] Comorbid History: [1:Anemia, Chronic Obstructive Pulmonary Disease (COPD), Sleep Apnea, Arrhythmia, Congestive Heart Failure, Coronary Artery Disease, Hypertension, Peripheral Venous Disease, Type II Diabetes, Osteoarthritis, Neuropathy] [N/A:N/A] Date Acquired: [1:02/12/2015] [N/A:N/A] Weeks of Treatment: [1:0] [N/A:N/A] Wound Status: [1:Open] [N/A:N/A] Measurements L x W x D 2.1x3.3x0.1 [N/A:N/A] (cm) Area (cm) : [1:5.443] [N/A:N/A] Volume (cm) : [1:0.544] [N/A:N/A] % Reduction in Area: [1:23.80%] [N/A:N/A] % Reduction in Volume: 23.90% [N/A:N/A] Classification: [1:Category/Stage III] [N/A:N/A] HBO Classification: [1:Grade 2] [N/A:N/A] Exudate Amount: [1:Large] [N/A:N/A] Exudate Type: [1:Serous] [N/A:N/A] Exudate Color: [1:amber] [N/A:N/A] Wound Margin: [1:Flat and Intact] [N/A:N/A] Granulation Amount: [1:Medium (34-66%)] [N/A:N/A] Granulation Quality: Red N/A N/A Necrotic Amount: Medium (34-66%) N/A N/A Exposed Structures: Fascia: No N/A N/A Fat: No Tendon: No Muscle: No Joint: No Bone: No Limited to Skin Breakdown Epithelialization: None  N/A N/A Periwound Skin Texture: Edema: No N/A N/A Excoriation: No Induration: No Callus: No Crepitus: No Fluctuance: No Friable: No Rash: No Scarring: No Periwound Skin Maceration: Yes N/A N/A Moisture: Moist: Yes Dry/Scaly: No Periwound Skin Color: Atrophie Blanche: No N/A N/A Cyanosis: No Ecchymosis: No Erythema: No Hemosiderin Staining: No Mottled: No Pallor: No Rubor: No Temperature: No Abnormality N/A N/A Tenderness on No N/A N/A Palpation: Wound Preparation: Ulcer Cleansing: N/A N/A Rinsed/Irrigated with Saline Topical Anesthetic Applied: Other: lidocaine 4% Treatment Notes Electronic Signature(s) Signed: 03/22/2015 4:04:56 PM By: Elpidio Eric BSN, RN Entered By: Elpidio Eric on 03/22/2015 14:52:05 [name] ([id number]) -------------------------------------------------------------------------------- Multi-Disciplinary Care Plan Details Patient Name: [name] Iha A. Date of Service: 03/22/2015 2:30 PM Medical Record Number: [id number] Patient Account Number: [id number] Date of Birth/Sex: May 05, 1941 (74 y.o. Female) Treating RN: [name], RN, BSN, [place] Sink Primary Care Physician: Lorie Phenix Other Clinician: Referring Physician: Lorie Phenix Treating Physician/Extender: Rudene Re in Treatment: 0 Active Inactive Abuse / Safety / Falls / Self Care Management Nursing Diagnoses: Potential for falls Goals: Patient will remain injury free Date Initiated: 03/16/2015 Goal Status: Active Interventions: Assess fall risk on admission and as needed Notes: Orientation to the Wound Care Program Nursing Diagnoses: Knowledge deficit related to the wound healing center program Goals: Patient/caregiver will verbalize understanding of the Wound Healing Center Program Date Initiated: 03/16/2015 Goal Status: Active Interventions: Provide education on orientation to the wound center Notes: Pressure Nursing Diagnoses: Potential for impaired tissue  integrity related to pressure, friction, moisture, and shear Goals: Patient will remain free from development of additional pressure ulcers Date Initiated: 03/16/2015 [name] ([id number]) Goal Status: Active Interventions: Assess potential for pressure ulcer upon admission and as needed Notes: Wound/Skin Impairment Nursing Diagnoses: Impaired tissue integrity Goals: Ulcer/skin breakdown will have a volume reduction of 30% by week 4 Date Initiated: 03/16/2015 Goal Status: Active Interventions: Assess patient/caregiver ability to perform ulcer/skin care regimen upon admission and as needed Notes: Electronic Signature(s) Signed: 03/22/2015 4:04:56 PM By: Elpidio Eric BSN, RN Entered By: Elpidio Eric on 03/22/2015 14:51:57 [name] Iha A. ([id number]) -------------------------------------------------------------------------------- Pain Assessment Details Patient Name: [name] Iha A. Date of Service: 03/22/2015 2:30 PM Medical Record Number: [id number] Patient Account Number: [id number] Date of Birth/Sex: 1941/06/24 (74 y.o. Female) Treating RN: Clover Mealy, RN, BSN, [place] Sink Primary Care Physician: Lorie Phenix Other Clinician: Referring Physician: Lorie Phenix Treating Physician/Extender: Rudene Re in Treatment: 0 Active Problems Location of Pain Severity and Description of Pain Patient Has Paino No Site Locations  Pain Management and Medication Current Pain Management: Electronic Signature(s) Signed: 03/22/2015 4:04:56 PM By: Elpidio Eric BSN, RN Entered By: Elpidio Eric on 03/22/2015 14:50:32 [name] Iha A. ([id number]) -------------------------------------------------------------------------------- Wound Assessment Details Patient Name: [name] Iha A. Date of Service: 03/22/2015 2:30 PM Medical Record Number: [id number] Patient Account Number: [id number] Date of Birth/Sex: [birth year] (74 y.o. Female) Treating RN: [name], RN, BSN, Rita Primary Care Physician:  Lorie Phenix Other Clinician: Referring Physician: Lorie Phenix Treating Physician/Extender: Rudene Re in Treatment: 0 Wound Status Wound Number: 1 Primary Pressure Ulcer Etiology: Wound Location: Left Calcaneous Wound Open Wounding Event: Pressure Injury Status: Date Acquired: 02/12/2015 Comorbid Anemia, Chronic Obstructive Pulmonary Weeks Of Treatment: 0 History: Disease (COPD), Sleep Apnea, Clustered Wound: No Arrhythmia, Congestive Heart Failure, Coronary Artery Disease, Hypertension, Peripheral Venous Disease, Type II Diabetes, Osteoarthritis, Neuropathy Photos Photo Uploaded By: Elpidio Eric on 03/22/2015 15:55:49 Wound Measurements Length: (cm) 2.1 Width: (cm) 3.3 Depth: (cm) 0.1 Area: (cm) 5.443 Volume: (cm) 0.544 % Reduction in Area: 23.8% % Reduction in Volume: 23.9% Epithelialization: None Tunneling: No Undermining: No Wound Description Classification: Category/Stage III Foul Odor Af Diabetic Severity [address]): Grade 2 Wound Margin: Flat and Intact Exudate Amount: Large Exudate Type: Serous Exudate Color: amber ter Cleansing: No Wound Bed Anstey, Nyajah A. ([id number]) Granulation Amount: Medium (34-66%) Exposed Structure Granulation Quality: Red Fascia Exposed: No Necrotic Amount: Medium (34-66%) Fat Layer Exposed: No Necrotic Quality: Adherent Slough Tendon Exposed: No Muscle Exposed: No Joint Exposed: No Bone Exposed: No Limited to Skin Breakdown Periwound Skin Texture Texture Color No Abnormalities Noted: No No Abnormalities Noted: No Callus: No Atrophie Blanche: No Crepitus: No Cyanosis: No Excoriation: No Ecchymosis: No Fluctuance: No Erythema: No Friable: No Hemosiderin Staining: No Induration: No Mottled: No Localized Edema: No Pallor: No Rash: No Rubor: No Scarring: No Temperature / Pain Moisture Temperature: No Abnormality No Abnormalities Noted: No Dry / Scaly: No Maceration: Yes Moist: Yes Wound  Preparation Ulcer Cleansing: Rinsed/Irrigated with Saline Topical Anesthetic Applied: Other: lidocaine 4%, Treatment Notes Wound #1 (Left Calcaneous) 1. Cleansed with: Clean wound with Normal Saline 4. Dressing Applied: Aquacel Ag 5. Secondary Dressing Applied Gauze and Kerlix/Conform 7. Secured with Tape Notes stretch netting Electronic Signature(s) Signed: 03/22/2015 4:04:56 PM By: Elpidio Eric BSN, RN Bezdek, Makaylia A. ([id number]) Entered By: Elpidio Eric on 03/22/2015 14:48:52 [name] ([id number]) -------------------------------------------------------------------------------- Vitals Details Patient Name: [name] Iha A. Date of Service: 03/22/2015 2:30 PM Medical Record Number: [id number] Patient Account Number: [id number] Date of Birth/Sex: [birth year] (74 y.o. Female) Treating RN: [name], RN, BSN, Rita Primary Care Physician: Lorie Phenix Other Clinician: Referring Physician: Lorie Phenix Treating Physician/Extender: Rudene Re in Treatment: 0 Vital Signs Time Taken: 14:50 Temperature (F): 98.4 Height (in): 62 Pulse (bpm): 65 Weight (lbs): 250 Respiratory Rate (breaths/min): 20 Body Mass Index (BMI): 45.7 Blood Pressure (mmHg): 138/47 Reference Range: 80 - 120 mg / dl Electronic Signature(s) Signed: 03/22/2015 4:04:56 PM By: Elpidio Eric BSN, RN Entered By: Elpidio Eric on 03/22/2015 14:50:58

## 2015-03-27 ENCOUNTER — Other Ambulatory Visit: Payer: Self-pay | Admitting: Nurse Practitioner

## 2015-03-27 DIAGNOSIS — R1310 Dysphagia, unspecified: Secondary | ICD-10-CM

## 2015-03-29 ENCOUNTER — Encounter: Payer: Medicare Other | Admitting: Surgery

## 2015-03-29 DIAGNOSIS — L89623 Pressure ulcer of left heel, stage 3: Secondary | ICD-10-CM | POA: Diagnosis not present

## 2015-03-30 NOTE — Progress Notes (Signed)
RASHONDA, WARRIOR ([id number]) Visit Report for 03/29/2015 Chief Complaint Document Details Patient Name: Traci, KOFOED A. Date of Service: 03/29/2015 2:30 PM Medical Record Number: [id number] Patient Account Number: [id number] Date of Birth/Sex: 1940-10-22 (74 y.o. Female) Treating RN: Primary Care Physician: Lorie Phenix Other Clinician: Referring Physician: Lorie Phenix Treating Physician/Extender: Rudene Re in Treatment: 1 Information Obtained from: Patient Chief Complaint Patient presents to the wound care center for a consult due non healing wound. 74 year old patient who comes with the chief complaints of having a ulcerated area on her left heel for about a month. Electronic Signature(s) Signed: 03/29/2015 5:00:54 PM By: Evlyn Kanner MD, FACS Entered By: Evlyn Kanner on 03/29/2015 15:05:47 [name] ([id number]) -------------------------------------------------------------------------------- Debridement Details Patient Name: Traci Iha A. Date of Service: 03/29/2015 2:30 PM Medical Record Number: [id number] Patient Account Number: [id number] Date of Birth/Sex: 18-Mar-1941 (74 y.o. Female) Treating RN: Curtis Sites Primary Care Physician: Lorie Phenix Other Clinician: Referring Physician: Lorie Phenix Treating Physician/Extender: Rudene Re in Treatment: 1 Debridement Performed for Wound #1 Left Calcaneous Assessment: Performed By: Physician Traci Schroeder., MD Debridement: Open Wound/Selective Debridement Selective Description: Pre-procedure Yes Verification/Time Out Taken: Start Time: 15:02 Pain Control: Lidocaine 4% Topical Solution Level: Non-Viable Tissue Total Area Debrided (L x 2.2 (cm) x 3.2 (cm) = 7.04 (cm) W): Instrument: Other : saline and gauze Bleeding: None End Time: 15:04 Procedural Pain: 0 Post Procedural Pain: 0 Response to Treatment: Procedure was tolerated well Post Debridement Measurements of Total  Wound Stage: Category/Stage III Electronic Signature(s) Signed: 03/29/2015 5:00:54 PM By: Evlyn Kanner MD, FACS Signed: 03/29/2015 5:48:32 PM By: Curtis Sites Entered By: Curtis Sites on 03/29/2015 15:04:43 Traci Iha A. ([id number]) -------------------------------------------------------------------------------- HPI Details Patient Name: Traci Iha A. Date of Service: 03/29/2015 2:30 PM Medical Record Number: [id number] Patient Account Number: [id number] Date of Birth/Sex: 14-Aug-1941 (74 y.o. Female) Treating RN: Primary Care Physician: Lorie Phenix Other Clinician: Referring Physician: Lorie Phenix Treating Physician/Extender: Rudene Re in Treatment: 1 History of Present Illness Location: bilateral pressure injuries to both heels, left more than right Quality: Patient reports No Pain. Severity: Patient states wound are getting worse. Duration: Patient has had the wound for > 1 months prior to seeking treatment at the wound center Context: The wound appeared gradually over time Modifying Factors: Consults to this date include:some local care as per the nursing home staff Associated Signs and Symptoms: Patient reports having:no sensation on her feet whatsoever HPI Description: 74 year old patient who is known to have several comorbidities including Diabetes mellitus, opoid dependence, cervical spondylosis, GERD, anemia, dermatitis, general debility and pressure ulcers of both heels is here for an opinion regarding the latter. She does not check her blood sugar every day and does not know the last blood values which were done about 2 weeks ago. she has been a smoker all her life and was smoking up to a pack and a half a day but now smokes 1 packet in 3 days. PAST MEDICAL HISTORY: DM type 2, COPD, OSA, hypothyroidism, hypertension, diabetes, hyperlipidemia, CHF, morbid obesity, previous history of tobacco abuse, coronary artery disease, paroxysmal atrial  fibrillation on Xarelto. Electronic Signature(s) Signed: 03/29/2015 5:00:54 PM By: Evlyn Kanner MD, FACS Entered By: Evlyn Kanner on 03/29/2015 15:05:54 [name] ([id number]) -------------------------------------------------------------------------------- Physical Exam Details Patient Name: Traci Iha A. Date of Service: 03/29/2015 2:30 PM Medical Record Number: [id number] Patient Account Number: [id number] Date of Birth/Sex: Dec 19, 1940 (74 y.o. Female) Treating RN: Primary Care Physician: Lorie Phenix Other Clinician: Referring Physician:  Lorie Phenix Treating Physician/Extender: Rudene Re in Treatment: 1 Constitutional . Pulse regular. Respirations normal and unlabored. Afebrile. . Eyes Nonicteric. Reactive to light. Ears, Nose, Mouth, and Throat Lips, teeth, and gums WNL.Marland Kitchen Moist mucosa without lesions . Neck supple and nontender. No palpable supraclavicular or cervical adenopathy. Normal sized without goiter. Respiratory WNL. No retractions.. Cardiovascular Pedal Pulses WNL. No clubbing, cyanosis or edema. Integumentary (Hair, Skin) minimal maceration down the calcaneal ulcer but other than that the slough can be washed out and abraded with a saline gauze.Marland Kitchen No crepitus or fluctuance. No peri-wound warmth or erythema. No masses.Marland Kitchen Psychiatric Judgement and insight Intact.. No evidence of depression, anxiety, or agitation.. Electronic Signature(s) Signed: 03/29/2015 5:00:54 PM By: Evlyn Kanner MD, FACS Entered By: Evlyn Kanner on 03/29/2015 15:06:37 [name] ([id number]) -------------------------------------------------------------------------------- Physician Orders Details Patient Name: Traci Iha A. Date of Service: 03/29/2015 2:30 PM Medical Record Number: [id number] Patient Account Number: [id number] Date of Birth/Sex: [birth year] (74 y.o. Female) Treating RN: Curtis Sites Primary Care Physician: Lorie Phenix Other  Clinician: Referring Physician: Lorie Phenix Treating Physician/Extender: Rudene Re in Treatment: 1 Verbal / Phone Orders: Yes Clinician: Curtis Sites Read Back and Verified: Yes Diagnosis Coding Wound Cleansing Wound #1 Left Calcaneous o Clean wound with Normal Saline. o May shower with protection. - may shower with dressing on and change dressing after shower Anesthetic Wound #1 Left Calcaneous o Topical Lidocaine 4% cream applied to wound bed prior to debridement Skin Barriers/Peri-Wound Care Wound #1 Left Calcaneous o Skin Prep Primary Wound Dressing Wound #1 Left Calcaneous o Aquacel Ag - or calcium alginate with silver equivalent Secondary Dressing Wound #1 Left Calcaneous o Conform/Kerlix - and stretch netting as needed to keep dressing intact o Boardered Foam Dressing Dressing Change Frequency Wound #1 Left Calcaneous o Other: - on shower days Follow-up Appointments Wound #1 Left Calcaneous o Return Appointment in 1 week. Electronic Signature(s) Signed: 03/29/2015 5:00:54 PM By: Evlyn Kanner MD, FACS Signed: 03/29/2015 5:48:32 PM By: Derry Lory, Deloria A. ([id number]) Entered By: Curtis Sites on 03/29/2015 15:06:19 [name] ([id number]) -------------------------------------------------------------------------------- Problem List Details Patient Name: Traci Iha A. Date of Service: 03/29/2015 2:30 PM Medical Record Number: [id number] Patient Account Number: [id number] Date of Birth/Sex: 1941-06-12 (74 y.o. 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 Date of Service: 03/29/2015 2:30 PM Medical Record Number: [id number] Patient Account Number: [id number] Date of Birth/Sex: 1941-06-24 (74 y.o. Female) Treating RN: Primary Care Physician: Lorie Phenix Other Clinician: Referring Physician: Lorie Phenix Treating Physician/Extender: Rudene Re in Treatment: 1 Subjective Chief Complaint Information obtained from Patient Patient presents to the wound care center for a consult due non healing wound. 74 year old patient who comes with the chief complaints of having a ulcerated area on her left heel for about a month. History of Present Illness (HPI) The following HPI elements  were documented for the patient's wound: Location: bilateral pressure injuries to both heels, left more than right Quality: Patient reports No Pain. Severity: Patient states wound are getting worse. Duration: Patient has had the wound for > 1 months prior to seeking treatment at the wound center Context: The wound appeared gradually over time Modifying Factors: Consults to this date include:some local care as per the nursing home staff Associated Signs and Symptoms: Patient reports having:no sensation on her feet whatsoever 74 year old patient who is known to have several comorbidities including Diabetes mellitus, opoid dependence, cervical spondylosis, GERD, anemia, dermatitis, general debility  and pressure ulcers of both heels is here for an opinion regarding the latter. She does not check her blood sugar every day and does not know the last blood values which were done about 2 weeks ago. she has been a smoker all her life and was smoking up to a pack and a half a day but now smokes 1 packet in 3 days. PAST MEDICAL HISTORY: DM type 2, COPD, OSA, hypothyroidism, hypertension, diabetes, hyperlipidemia, CHF, morbid obesity, previous history of tobacco abuse, coronary artery disease, paroxysmal atrial fibrillation on Xarelto. Objective Constitutional Pulse regular. Respirations normal and unlabored. Afebrile. Vitals Time Taken: 2:50 PM, Height: 62 in, Weight: 250 lbs, BMI: 45.7, Temperature: 98.8 F, Pulse: 66 [name], [name] ([id number]) bpm, Respiratory Rate: 20 breaths/min, Blood Pressure: 142/48 mmHg. Eyes Nonicteric. Reactive to light. Ears, Nose, Mouth, and Throat Lips, teeth, and gums WNL.Marland Kitchen Moist mucosa without lesions . Neck supple and nontender. No palpable supraclavicular or cervical adenopathy. Normal sized without goiter. Respiratory WNL. No retractions.. Cardiovascular Pedal Pulses WNL. No clubbing, cyanosis or edema. Psychiatric Judgement and insight Intact.. No evidence of depression, anxiety, or agitation.. Integumentary (Hair, Skin) minimal maceration down the calcaneal ulcer but other than that the slough can be washed out and abraded with a saline gauze.Marland Kitchen No crepitus or fluctuance. No peri-wound warmth or erythema. No masses.. Wound #1 status is Open. Original cause of wound was Pressure Injury. The wound is located on the Left Calcaneous. The wound measures 2.2cm length x 3.2cm width x 0.1cm depth; 5.529cm^2 area and 0.553cm^3 volume. The wound is limited to skin breakdown. There is no tunneling or undermining noted. There is a large amount of serous drainage noted. The wound margin is flat and intact. There is large (67- 100%) red granulation within the  wound bed. There is no necrotic tissue within the wound bed. The periwound skin appearance exhibited: Maceration, Moist. The periwound skin appearance did not exhibit: Callus, Crepitus, Excoriation, Fluctuance, Friable, Induration, Localized Edema, Rash, Scarring, Dry/Scaly, Atrophie Blanche, Cyanosis, Ecchymosis, Hemosiderin Staining, Mottled, Pallor, Rubor, Erythema. Periwound temperature was noted as No Abnormality. 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 ([id number]) Use Desitin around the wound edges and continue to use silver alginate for the present week. If the wound is better next week we'll probably move toward Prisma. I have urged her to completely give up smoking and she has started using a nicotine patch. She will come back and see me next week. Procedures Wound #1 Wound #1 is a Pressure Ulcer located on the Left Calcaneous . There was a Non-Viable Tissue Open Wound/Selective [id number]) debridement with total area of 7.04 sq  cm performed by Traci Schroeder., MD. with the following instrument(s): saline and gauze after achieving pain control using Lidocaine 4% Topical Solution. A time out was conducted prior to the start of the procedure. There was no bleeding. The procedure was tolerated well with a pain level of 0 throughout and a pain level of 0 following the procedure. Post Debridement Measurements: . Post debridement Stage noted as Category/Stage III. Plan Wound Cleansing: Wound #1 Left Calcaneous: Clean wound with Normal Saline. May shower with protection. - may shower with dressing on and change dressing after shower Anesthetic: Wound #1  Left Calcaneous: Topical Lidocaine 4% cream applied to wound bed prior to debridement Skin Barriers/Peri-Wound Care: Wound #1 Left Calcaneous: Skin Prep Primary Wound Dressing: Wound #1 Left Calcaneous: Aquacel Ag - or calcium alginate with silver equivalent Secondary Dressing: Wound #1 Left Calcaneous: Conform/Kerlix - and stretch netting as needed to keep dressing intact Boardered Foam Dressing Dressing Change Frequency: Wound #1 Left Calcaneous: Other: - on shower days [name], [name] ([id number]) Follow-up Appointments: Wound #1 Left Calcaneous: Return Appointment in 1 week. Use Desitin around the wound edges and continue to use silver alginate for the present week. If the wound is better next week we'll probably move toward Prisma. I have urged her to completely give up smoking and she has started using a nicotine patch. She will come back and see me next week. Electronic Signature(s) Signed: 03/29/2015 5:00:54 PM By: Evlyn Kanner MD, FACS Entered By: Evlyn Kanner on 03/29/2015 15:08:15 [name] ([id number]) -------------------------------------------------------------------------------- SuperBill Details Patient Name: Traci Iha A. Date of Service: 03/29/2015 Medical Record Number: [id number] Patient Account Number: [id number] Date of Birth/Sex: [birth year] (74 y.o. 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 L89.623 Pressure ulcer of left heel, stage 3 E66.01 Morbid (severe) obesity due to excess calories F17.218 Nicotine dependence, cigarettes, with other nicotine-induced disorders Facility Procedures CPT4 Code Description: [id number] - DEBRIDE  WOUND 1ST 20 SQ CM OR < ICD-10 Description Diagnosis E11.621 Type 2 diabetes mellitus with foot ulcer E08.42 Diabetes mellitus due to underlying condition with L89.623 Pressure ulcer of left heel, stage 3 Modifier: diabetic poly Quantity: 1 neuropathy Physician Procedures CPT4 Code Description: [id number] - WC PHYS DEBR WO ANESTH 20 SQ CM ICD-10 Description Diagnosis E11.621 Type 2 diabetes mellitus with foot ulcer E08.42 Diabetes mellitus due to underlying condition with L89.623 Pressure ulcer of left heel, stage 3 Modifier: diabetic poly Quantity: 1 neuropathy Electronic Signature(s) Signed: 03/29/2015 5:00:54 PM By: Evlyn Kanner MD, FACS Entered By: Evlyn Kanner on 03/29/2015 15:08:31

## 2015-03-30 NOTE — Progress Notes (Signed)
[name] ([id number]) Visit Report for 03/29/2015 Arrival Information Details Patient Name: [name], [name] Date of Service: 03/29/2015 2:30 PM Medical Record Number: [id number] Patient Account Number: [id number] Date of Birth/Sex: 20-Sep-1941 (74 y.o. Female) Treating RN: Curtis Sites Primary Care Physician: Lorie Phenix Other Clinician: Referring Physician: Lorie Phenix Treating Physician/Extender: Rudene Re in Treatment: 1 Visit Information History Since Last Visit Added or deleted any medications: No Patient Arrived: Wheel Chair Any new allergies or adverse reactions: No Arrival Time: 14:40 Had a fall or experienced change in No Accompanied By: self activities of daily living that may affect Transfer Assistance: None risk of falls: Patient Identification Verified: Yes Signs or symptoms of abuse/neglect since last No Secondary Verification Process Yes visito Completed: Hospitalized since last visit: No Patient Has Alerts: Yes Pain Present Now: No Patient Alerts: Patient on Blood Thinner DMII xarelto, asa Electronic Signature(s) Signed: 03/29/2015 5:48:32 PM By: Curtis Sites Entered By: Curtis Sites on 03/29/2015 14:48:41 [name] ([id number]) -------------------------------------------------------------------------------- Encounter Discharge Information Details Patient Name: Traci Iha A. Date of Service: 03/29/2015 2:30 PM Medical Record Number: [id number] Patient Account Number: [id number] Date of Birth/Sex: 1941/07/28 (74 y.o. Female) Treating RN: Curtis Sites Primary Care Physician: Lorie Phenix Other Clinician: Referring Physician: Lorie Phenix Treating Physician/Extender: Rudene Re in Treatment: 1 Encounter Discharge Information Items Discharge Pain Level: 0 Discharge Condition: Stable Ambulatory Status: Wheelchair Discharge Destination: Nursing Home Transportation: Private Auto Accompanied By:  self Schedule Follow-up Appointment: Yes Medication Reconciliation completed and provided to Patient/Care No Shaniqwa Horsman: Provided on Clinical Summary of Care: 03/29/2015 Form Type Recipient Paper Patient GG Electronic Signature(s) Signed: 03/29/2015 3:21:57 PM By: Gwenlyn Perking Entered By: Gwenlyn Perking on 03/29/2015 15:21:57 Traci Iha A. ([id number]) -------------------------------------------------------------------------------- Lower Extremity Assessment Details Patient Name: Traci Iha A. Date of Service: 03/29/2015 2:30 PM Medical Record Number: [id number] Patient Account Number: [id number] Date of Birth/Sex: 1940/11/03 (74 y.o. Female) Treating RN: Curtis Sites Primary Care Physician: Lorie Phenix Other Clinician: Referring Physician: Lorie Phenix Treating Physician/Extender: Rudene Re in Treatment: 1 Vascular Assessment Pulses: Posterior Tibial Dorsalis Pedis Palpable: [Left:Yes] Extremity colors, hair growth, and conditions: Extremity Color: [Left:Normal] Hair Growth on Extremity: [Left:No] Temperature of Extremity: [Left:Warm] Capillary Refill: [Left:< 3 seconds] Toe Nail Assessment Left: Right: Thick: Yes Discolored: No Deformed: No Improper Length and Hygiene: No Electronic Signature(s) Signed: 03/29/2015 5:48:32 PM By: Curtis Sites Entered By: Curtis Sites on 03/29/2015 14:55:54 Sharp, [name] ([id number]) -------------------------------------------------------------------------------- Multi Wound Chart Details Patient Name: Traci Iha A. Date of Service: 03/29/2015 2:30 PM Medical Record Number: [id number] Patient Account Number: [id number] Date of Birth/Sex: 12/04/1940 (74 y.o. Female) Treating RN: Curtis Sites Primary Care Physician: Lorie Phenix Other Clinician: Referring Physician: Lorie Phenix Treating Physician/Extender: Rudene Re in Treatment: 1 Vital Signs Height(in): 62 Pulse(bpm): 66 Weight(lbs):  250 Blood Pressure 142/48 (mmHg): Body Mass Index(BMI): 46 Temperature(F): 98.8 Respiratory Rate 20 (breaths/min): Photos: [1:No Photos] [N/A:N/A] Wound Location: [1:Left Calcaneous] [N/A:N/A] Wounding Event: [1:Pressure Injury] [N/A:N/A] Primary Etiology: [1:Pressure Ulcer] [N/A:N/A] Comorbid History: [1:Anemia, Chronic Obstructive Pulmonary Disease (COPD), Sleep Apnea, Arrhythmia, Congestive Heart Failure, Coronary Artery Disease, Hypertension, Peripheral Venous Disease, Type II Diabetes, Osteoarthritis, Neuropathy] [N/A:N/A] Date Acquired: [1:02/12/2015] [N/A:N/A] Weeks of Treatment: [1:1] [N/A:N/A] Wound Status: [1:Open] [N/A:N/A] Measurements L x W x D 2.2x3.2x0.1 [N/A:N/A] (cm) Area (cm) : [1:5.529] [N/A:N/A] Volume (cm) : [1:0.553] [N/A:N/A] % Reduction in Area: [1:22.60%] [N/A:N/A] % Reduction in Volume: 22.70% [N/A:N/A] Classification: [1:Category/Stage III] [N/A:N/A] HBO Classification: [1:Grade 2] [N/A:N/A] Exudate Amount: [1:Large] [N/A:N/A] Exudate Type: [1:Serous] [  N/A:N/A] Exudate Color: [1:amber] [N/A:N/A] Wound Margin: [1:Flat and Intact] [N/A:N/A] Granulation Amount: [1:Large (67-100%)] [N/A:N/A] Granulation Quality: Red N/A N/A Necrotic Amount: None Present (0%) N/A N/A Exposed Structures: Fascia: No N/A N/A Fat: No Tendon: No Muscle: No Joint: No Bone: No Limited to Skin Breakdown Epithelialization: None N/A N/A Periwound Skin Texture: Edema: No N/A N/A Excoriation: No Induration: No Callus: No Crepitus: No Fluctuance: No Friable: No Rash: No Scarring: No Periwound Skin Maceration: Yes N/A N/A Moisture: Moist: Yes Dry/Scaly: No Periwound Skin Color: Atrophie Blanche: No N/A N/A Cyanosis: No Ecchymosis: No Erythema: No Hemosiderin Staining: No Mottled: No Pallor: No Rubor: No Temperature: No Abnormality N/A N/A Tenderness on No N/A N/A Palpation: Wound Preparation: Ulcer Cleansing: N/A N/A Rinsed/Irrigated with Saline Topical  Anesthetic Applied: Other: lidocaine 4% Treatment Notes Electronic Signature(s) Signed: 03/29/2015 5:48:32 PM By: Curtis Sites Entered By: Curtis Sites on 03/29/2015 14:56:06 [name] ([id number]) -------------------------------------------------------------------------------- Multi-Disciplinary Care Plan Details Patient Name: Traci Iha A. Date of Service: 03/29/2015 2:30 PM Medical Record Number: [id number] Patient Account Number: [id number] Date of Birth/Sex: [birth year] (74 y.o. Female) Treating RN: Curtis Sites Primary Care Physician: Lorie Phenix Other Clinician: Referring Physician: Lorie Phenix Treating Physician/Extender: Rudene Re in Treatment: 1 Active Inactive Abuse / Safety / Falls / Self Care Management Nursing Diagnoses: Potential for falls Goals: Patient will remain injury free Date Initiated: 03/16/2015 Goal Status: Active Interventions: Assess fall risk on admission and as needed Notes: Orientation to the Wound Care Program Nursing Diagnoses: Knowledge deficit related to the wound healing center program Goals: Patient/caregiver will verbalize understanding of the Wound Healing Center Program Date Initiated: 03/16/2015 Goal Status: Active Interventions: Provide education on orientation to the wound center Notes: Pressure Nursing Diagnoses: Potential for impaired tissue integrity related to pressure, friction, moisture, and shear Goals: Patient will remain free from development of additional pressure ulcers Date Initiated: 03/16/2015 [name] ([id number]) Goal Status: Active Interventions: Assess potential for pressure ulcer upon admission and as needed Notes: Wound/Skin Impairment Nursing Diagnoses: Impaired tissue integrity Goals: Ulcer/skin breakdown will have a volume reduction of 30% by week 4 Date Initiated: 03/16/2015 Goal Status: Active Interventions: Assess patient/caregiver ability to perform ulcer/skin  care regimen upon admission and as needed Notes: Electronic Signature(s) Signed: 03/29/2015 5:48:32 PM By: Curtis Sites Entered By: Curtis Sites on 03/29/2015 14:56:00 [name] ([id number]) -------------------------------------------------------------------------------- Patient/Caregiver Education Details Patient Name: Traci Iha A. Date of Service: 03/29/2015 2:30 PM Medical Record Number: [id number] Patient Account Number: [id number] Date of Birth/Gender: Oct 30, 1940 (74 y.o. Female) Treating RN: Curtis Sites Primary Care Physician: Lorie Phenix Other Clinician: Referring Physician: Lorie Phenix Treating Physician/Extender: Rudene Re in Treatment: 1 Education Assessment Education Provided To: Patient Education Topics Provided Wound/Skin Impairment: Handouts: Other: how to apply sage boots correctly Methods: Demonstration, Explain/Verbal Responses: State content correctly Electronic Signature(s) Signed: 03/29/2015 3:00:15 PM By: Curtis Sites Entered By: Curtis Sites on 03/29/2015 15:00:14 [name] ([id number]) -------------------------------------------------------------------------------- Wound Assessment Details Patient Name: Traci Iha A. Date of Service: 03/29/2015 2:30 PM Medical Record Number: [id number] Patient Account Number: [id number] Date of Birth/Sex: May 03, 1941 (74 y.o. Female) Treating RN: Curtis Sites Primary Care Physician: Lorie Phenix Other Clinician: Referring Physician: Lorie Phenix Treating Physician/Extender: Rudene Re in Treatment: 1 Wound Status Wound Number: 1 Primary Pressure Ulcer Etiology: Wound Location: Left Calcaneous Wound Open Wounding Event: Pressure Injury Status: Date Acquired: 02/12/2015 Comorbid Anemia, Chronic Obstructive Pulmonary Weeks Of Treatment: 1 History: Disease (COPD), Sleep Apnea, Clustered Wound: No Arrhythmia, Congestive Heart Failure,  Coronary Artery Disease,  Hypertension, Peripheral Venous Disease, Type II Diabetes, Osteoarthritis, Neuropathy Photos Photo Uploaded By: Curtis Sites on 03/29/2015 17:42:34 Wound Measurements Length: (cm) 2.2 Width: (cm) 3.2 Depth: (cm) 0.1 Area: (cm) 5.529 Volume: (cm) 0.553 % Reduction in Area: 22.6% % Reduction in Volume: 22.7% Epithelialization: None Tunneling: No Undermining: No Wound Description Classification: Category/Stage III Diabetic Severity [address]): Grade 2 Wound Margin: Flat and Intact Exudate Amount: Large Exudate Type: Serous Exudate Color: amber Foul Odor After Cleansing: No Wound Bed Favaro, [name] ([id number]) Granulation Amount: Large (67-100%) Exposed Structure Granulation Quality: Red Fascia Exposed: No Necrotic Amount: None Present (0%) Fat Layer Exposed: No Tendon Exposed: No Muscle Exposed: No Joint Exposed: No Bone Exposed: No Limited to Skin Breakdown Periwound Skin Texture Texture Color No Abnormalities Noted: No No Abnormalities Noted: No Callus: No Atrophie Blanche: No Crepitus: No Cyanosis: No Excoriation: No Ecchymosis: No Fluctuance: No Erythema: No Friable: No Hemosiderin Staining: No Induration: No Mottled: No Localized Edema: No Pallor: No Rash: No Rubor: No Scarring: No Temperature / Pain Moisture Temperature: No Abnormality No Abnormalities Noted: No Dry / Scaly: No Maceration: Yes Moist: Yes Wound Preparation Ulcer Cleansing: Rinsed/Irrigated with Saline Topical Anesthetic Applied: Other: lidocaine 4%, Treatment Notes Wound #1 (Left Calcaneous) 1. Cleansed with: Clean wound with Normal Saline 2. Anesthetic Topical Lidocaine 4% cream to wound bed prior to debridement 3. Peri-wound Care: Barrier cream 4. Dressing Applied: Aquacel Ag 5. Secondary Dressing Applied Bordered Foam Dressing Kerlix/Conform Notes stretch netting DAQUANA, PADDOCK ([id number]) Electronic Signature(s) Signed: 03/29/2015 5:48:32 PM By: Curtis Sites Entered By: Curtis Sites on 03/29/2015 14:55:18 [name] ([id number]) -------------------------------------------------------------------------------- Vitals Details Patient Name: Traci Iha A. Date of Service: 03/29/2015 2:30 PM Medical Record Number: [id number] Patient Account Number: [id number] Date of Birth/Sex: 1941/02/10 (74 y.o. Female) Treating RN: Curtis Sites Primary Care Physician: Lorie Phenix Other Clinician: Referring Physician: Lorie Phenix Treating Physician/Extender: Rudene Re in Treatment: 1 Vital Signs Time Taken: 14:50 Temperature (F): 98.8 Height (in): 62 Pulse (bpm): 66 Weight (lbs): 250 Respiratory Rate (breaths/min): 20 Body Mass Index (BMI): 45.7 Blood Pressure (mmHg): 142/48 Reference Range: 80 - 120 mg / dl Electronic Signature(s) Signed: 03/29/2015 5:48:32 PM By: Curtis Sites Entered By: Curtis Sites on 03/29/2015 14:50:52

## 2015-04-02 ENCOUNTER — Ambulatory Visit
Admission: RE | Admit: 2015-04-02 | Discharge: 2015-04-02 | Disposition: A | Discharge status: Home / Self Care | Payer: Medicare Other | Source: Ambulatory Visit | Attending: Nurse Practitioner | Admitting: Nurse Practitioner

## 2015-04-02 DIAGNOSIS — K449 Diaphragmatic hernia without obstruction or gangrene: Secondary | ICD-10-CM | POA: Insufficient documentation

## 2015-04-02 DIAGNOSIS — R1310 Dysphagia, unspecified: Secondary | ICD-10-CM | POA: Diagnosis present

## 2015-04-02 DIAGNOSIS — K219 Gastro-esophageal reflux disease without esophagitis: Secondary | ICD-10-CM | POA: Diagnosis present

## 2015-04-06 ENCOUNTER — Encounter: Payer: Medicare Other | Attending: Surgery | Admitting: Surgery

## 2015-04-06 DIAGNOSIS — I509 Heart failure, unspecified: Secondary | ICD-10-CM | POA: Insufficient documentation

## 2015-04-06 DIAGNOSIS — E039 Hypothyroidism, unspecified: Secondary | ICD-10-CM | POA: Insufficient documentation

## 2015-04-06 DIAGNOSIS — I10 Essential (primary) hypertension: Secondary | ICD-10-CM | POA: Insufficient documentation

## 2015-04-06 DIAGNOSIS — E11621 Type 2 diabetes mellitus with foot ulcer: Secondary | ICD-10-CM | POA: Insufficient documentation

## 2015-04-06 DIAGNOSIS — E1142 Type 2 diabetes mellitus with diabetic polyneuropathy: Secondary | ICD-10-CM | POA: Insufficient documentation

## 2015-04-06 DIAGNOSIS — L89623 Pressure ulcer of left heel, stage 3: Secondary | ICD-10-CM | POA: Insufficient documentation

## 2015-04-06 DIAGNOSIS — E785 Hyperlipidemia, unspecified: Secondary | ICD-10-CM | POA: Insufficient documentation

## 2015-04-06 DIAGNOSIS — J449 Chronic obstructive pulmonary disease, unspecified: Secondary | ICD-10-CM | POA: Insufficient documentation

## 2015-04-06 DIAGNOSIS — F17218 Nicotine dependence, cigarettes, with other nicotine-induced disorders: Secondary | ICD-10-CM | POA: Insufficient documentation

## 2015-04-06 DIAGNOSIS — I2510 Atherosclerotic heart disease of native coronary artery without angina pectoris: Secondary | ICD-10-CM | POA: Insufficient documentation

## 2015-04-06 DIAGNOSIS — I480 Paroxysmal atrial fibrillation: Secondary | ICD-10-CM | POA: Insufficient documentation

## 2015-04-10 NOTE — Progress Notes (Signed)
[name], [name] ([id number]) Visit Report for 04/06/2015 Arrival Information Details Patient Name: [name], [name] Date of Service: 04/06/2015 1:00 PM Medical Record Number: [id number] Patient Account Number: [id number] Date of Birth/Sex: 1941-10-06 (74 y.o. Female) Treating RN: [name], RN, BSN, [place] Sink Primary Care Physician: SYSTEM, PCP Other Clinician: Referring Physician: Lorie Phenix Treating Physician/Extender: [name] in Treatment: 3 Visit Information History Since Last Visit Any new allergies or adverse reactions: No Patient Arrived: Wheel Chair Had a fall or experienced change in No Arrival Time: 13:04 activities of daily living that may affect Accompanied By: self risk of falls: Transfer Assistance: None Signs or symptoms of abuse/neglect since last No Patient Identification Verified: Yes visito Secondary Verification Process Yes Hospitalized since last visit: No Completed: Has Dressing in Place as Prescribed: Yes Patient Has Alerts: Yes Pain Present Now: No Patient Alerts: Patient on Blood Thinner DMII xarelto, asa Electronic Signature(s) Signed: 04/06/2015 2:48:51 PM By: Elpidio Eric BSN, RN Entered By: Elpidio Eric on 04/06/2015 13:05:12 [name] ([id number]) -------------------------------------------------------------------------------- Clinic Level of Care Assessment Details Patient Name: Traci Iha A. Date of Service: 04/06/2015 1:00 PM Medical Record Number: [id number] Patient Account Number: [id number] Date of Birth/Sex: 31-Jan-1941 (74 y.o. Female) Treating RN: [name], RN, BSN, Rita Primary Care Physician: SYSTEM, PCP Other Clinician: Referring Physician: Lorie Phenix Treating Physician/Extender: [name] in Treatment: 3 Clinic Level of Care Assessment Items TOOL 4 Quantity Score  - Use when only an EandM is performed on FOLLOW-UP visit 0 ASSESSMENTS - Nursing Assessment / Reassessment X - Reassessment of Co-morbidities  (includes updates in patient status) 1 10 X - Reassessment of Adherence to Treatment Plan 1 5 ASSESSMENTS - Wound and Skin Assessment / Reassessment X - Simple Wound Assessment / Reassessment - one wound 1 5  - Complex Wound Assessment / Reassessment - multiple wounds 0  - Dermatologic / Skin Assessment (not related to wound area) 0 ASSESSMENTS - Focused Assessment  - Circumferential Edema Measurements - multi extremities 0  - Nutritional Assessment / Counseling / Intervention 0  - Lower Extremity Assessment (monofilament, tuning fork, pulses) 0  - Peripheral Arterial Disease Assessment (using hand held doppler) 0 ASSESSMENTS - Ostomy and/or Continence Assessment and Care  - Incontinence Assessment and Management 0  - Ostomy Care Assessment and Management (repouching, etc.) 0 PROCESS - Coordination of Care X - Simple Patient / Family Education for ongoing care 1 15  - Complex (extensive) Patient / Family Education for ongoing care 0  - Staff obtains Chiropractor, Records, Test Results / Process Orders 0  - Staff telephones HHA, Nursing Homes / Clarify orders / etc 0  - Routine Transfer to another Facility (non-emergent condition) 0 [name], [name] ([id number])  - Routine Hospital Admission (non-emergent condition) 0  - New Admissions / Manufacturing engineer / Ordering NPWT, Apligraf, etc. 0  - Emergency Hospital Admission (emergent condition) 0 X - Simple Discharge Coordination 1 10  - Complex (extensive) Discharge Coordination 0 PROCESS - Special Needs  - Pediatric / Minor Patient Management 0  - Isolation Patient Management 0  - Hearing / Language / Visual special needs 0  - Assessment of Community assistance (transportation, D/C planning, etc.) 0  - Additional assistance / Altered mentation 0  - Support Surface(s) Assessment (bed, cushion, seat, etc.) 0 INTERVENTIONS - Wound Cleansing / Measurement X - Simple Wound Cleansing - one wound  1 5  - Complex Wound Cleansing - multiple wounds 0 X - Wound Imaging (photographs - any number of wounds)  1 5 []  - Wound Tracing (instead of photographs) 0 X - Simple Wound Measurement - one wound 1 5 []  - Complex Wound Measurement - multiple wounds 0 INTERVENTIONS - Wound Dressings X - Small Wound Dressing one or multiple wounds 1 10 []  - Medium Wound Dressing one or multiple wounds 0 []  - Large Wound Dressing one or multiple wounds 0 []  - Application of Medications - topical 0 []  - Application of Medications - injection 0 INTERVENTIONS - Miscellaneous []  - External ear exam 0 [name], [name] ([id number]) []  - Specimen Collection (cultures, biopsies, blood, body fluids, etc.) 0 []  - Specimen(s) / Culture(s) sent or taken to Lab for analysis 0 []  - Patient Transfer (multiple staff / Michiel Sites Lift / Similar devices) 0 []  - Simple Staple / Suture removal (25 or less) 0 []  - Complex Staple / Suture removal (26 or more) 0 []  - Hypo / Hyperglycemic Management (close monitor of Blood Glucose) 0 []  - Ankle / Brachial Index (ABI) - do not check if billed separately 0 X - Vital Signs 1 5 Has the patient been seen at the hospital within the last three years: Yes Total Score: 75 Level Of Care: New/Established - Level 2 Electronic Signature(s) Unsigned Entered ByElpidio Eric on 04/10/2015 10:37:10 Signature(s): Date(s): [name] ([id number]) -------------------------------------------------------------------------------- Lower Extremity Assessment Details Patient Name: [name], [name] Date of Service: 04/06/2015 1:00 PM Medical Record Number: [id number] Patient Account Number: [id number] Date of Birth/Sex: [birth year] (74 y.o. Female) Treating RN: [name], RN, BSN, [place] Sink Primary Care Physician: SYSTEM, PCP Other Clinician: Referring Physician: Lorie Phenix Treating Physician/Extender: [name] in Treatment: 3 Vascular Assessment Pulses: Posterior Tibial Dorsalis  Pedis Palpable: [Left:Yes] Extremity colors, hair growth, and conditions: Extremity Color: [Left:Normal] Hair Growth on Extremity: [Left:No] Temperature of Extremity: [Left:Warm] Capillary Refill: [Left:< 3 seconds] Dependent Rubor: [Left:No] Blanched when Elevated: [Left:No] Lipodermatosclerosis: [Left:No] Toe Nail Assessment Left: Right: Thick: Yes Discolored: Yes Deformed: No Improper Length and Hygiene: Yes Electronic Signature(s) Signed: 04/06/2015 2:48:51 PM By: Elpidio Eric BSN, RN Entered By: Elpidio Eric on 04/06/2015 13:09:40 Traci Iha A. ([id number]) -------------------------------------------------------------------------------- Multi Wound Chart Details Patient Name: Traci Iha A. Date of Service: 04/06/2015 1:00 PM Medical Record Number: [id number] Patient Account Number: [id number] Date of Birth/Sex: 04-13-1941 (74 y.o. Female) Treating RN: Clover Mealy, RN, BSN, [place] Sink Primary Care Physician: SYSTEM, PCP Other Clinician: Referring Physician: Lorie Phenix Treating Physician/Extender: [name] in Treatment: 3 Vital Signs Height(in): 62 Pulse(bpm): 67 Weight(lbs): 250 Blood Pressure 140/55 (mmHg): Body Mass Index(BMI): 46 Temperature(F): 98.2 Respiratory Rate 20 (breaths/min): Photos: [1:No Photos] [N/A:N/A] Wound Location: [1:Left Calcaneous] [N/A:N/A] Wounding Event: [1:Pressure Injury] [N/A:N/A] Primary Etiology: [1:Pressure Ulcer] [N/A:N/A] Comorbid History: [1:Anemia, Chronic Obstructive Pulmonary Disease (COPD), Sleep Apnea, Arrhythmia, Congestive Heart Failure, Coronary Artery Disease, Hypertension, Peripheral Venous Disease, Type II Diabetes, Osteoarthritis, Neuropathy] [N/A:N/A] Date Acquired: [1:02/12/2015] [N/A:N/A] Weeks of Treatment: [1:3] [N/A:N/A] Wound Status: [1:Open] [N/A:N/A] Measurements L x W x D 1.7x2.7x0.1 [N/A:N/A] (cm) Area (cm) : [1:3.605] [N/A:N/A] Volume (cm) : [1:0.36] [N/A:N/A] % Reduction in Area: [1:49.60%]  [N/A:N/A] % Reduction in Volume: 49.70% [N/A:N/A] Classification: [1:Category/Stage III] [N/A:N/A] HBO Classification: [1:Grade 2] [N/A:N/A] Exudate Amount: [1:Large] [N/A:N/A] Exudate Type: [1:Serous] [N/A:N/A] Exudate Color: [1:amber] [N/A:N/A] Foul Odor After [1:Yes] [N/A:N/A] Cleansing: Wittwer, [name] ([id number]) Odor Anticipated Due to No N/A N/A Product Use: Wound Margin: Flat and Intact N/A N/A Granulation Amount: Large (67-100%) N/A N/A Granulation Quality: Red N/A N/A Necrotic Amount: None Present (0%) N/A N/A Exposed Structures: Fascia: No N/A  N/A Fat: No Tendon: No Muscle: No Joint: No Bone: No Limited to Skin Breakdown Epithelialization: Small (1-33%) N/A N/A Periwound Skin Texture: Edema: No N/A N/A Excoriation: No Induration: No Callus: No Crepitus: No Fluctuance: No Friable: No Rash: No Scarring: No Periwound Skin Maceration: Yes N/A N/A Moisture: Moist: Yes Dry/Scaly: No Periwound Skin Color: Atrophie Blanche: No N/A N/A Cyanosis: No Ecchymosis: No Erythema: No Hemosiderin Staining: No Mottled: No Pallor: No Rubor: No Temperature: No Abnormality N/A N/A Tenderness on No N/A N/A Palpation: Wound Preparation: Ulcer Cleansing: N/A N/A Rinsed/Irrigated with Saline Topical Anesthetic Applied: Other: lidocaine 4% Treatment Notes Electronic Signature(s) Traci GiovanniGROCE, Bren A. ([id number]) Signed: 04/06/2015 2:48:51 PM By: Elpidio EricAfful, Rita BSN, RN Entered By: Elpidio EricAfful, Rita on 04/06/2015 13:19:33 Traci GiovanniGROCE, Wilma A. ([id number]) -------------------------------------------------------------------------------- Multi-Disciplinary Care Plan Details Patient Name: Traci IhaGROCE, [name] Date of Service: 04/06/2015 1:00 PM Medical Record Number: [id number] Patient Account Number: [id number] Date of Birth/Sex: 05/01/1941 (74 y.o. Female) Treating RN: [name], RN, BSN, [place] Sinkita Primary Care Physician: SYSTEM, PCP Other Clinician: Referring Physician: Lorie PhenixMaloney, Nancy Treating  Physician/Extender: Traci ReBritto, Errol Weeks in Treatment: 3 Active Inactive Abuse / Safety / Falls / Self Care Management Nursing Diagnoses: Potential for falls Goals: Patient will remain injury free Date Initiated: 03/16/2015 Goal Status: Active Interventions: Assess fall risk on admission and as needed Notes: Orientation to the Wound Care Program Nursing Diagnoses: Knowledge deficit related to the wound healing center program Goals: Patient/caregiver will verbalize understanding of the Wound Healing Center Program Date Initiated: 03/16/2015 Goal Status: Active Interventions: Provide education on orientation to the wound center Notes: Pressure Nursing Diagnoses: Potential for impaired tissue integrity related to pressure, friction, moisture, and shear Goals: Patient will remain free from development of additional pressure ulcers Date Initiated: 03/16/2015 Traci GiovanniGROCE, [name] ([id number]) Goal Status: Active Interventions: Assess potential for pressure ulcer upon admission and as needed Notes: Wound/Skin Impairment Nursing Diagnoses: Impaired tissue integrity Goals: Ulcer/skin breakdown will have a volume reduction of 30% by week 4 Date Initiated: 03/16/2015 Goal Status: Active Interventions: Assess patient/caregiver ability to perform ulcer/skin care regimen upon admission and as needed Notes: Electronic Signature(s) Signed: 04/06/2015 2:48:51 PM By: Elpidio EricAfful, Rita BSN, RN Entered By: Elpidio EricAfful, Rita on 04/06/2015 13:19:24 Traci GiovanniGROCE, Arelys A. ([id number]) -------------------------------------------------------------------------------- Pain Assessment Details Patient Name: Traci IhaGROCE, [name] Date of Service: 04/06/2015 1:00 PM Medical Record Number: [id number] Patient Account Number: [id number] Date of Birth/Sex: 01/18/1941 (74 y.o. Female) Treating RN: Clover MealyAfful, RN, BSN, [place] Sinkita Primary Care Physician: SYSTEM, PCP Other Clinician: Referring Physician: Lorie PhenixMaloney, Nancy Treating Physician/Extender:  Traci ReBritto, Errol Weeks in Treatment: 3 Active Problems Location of Pain Severity and Description of Pain Patient Has Paino No Site Locations Pain Management and Medication Current Pain Management: Electronic Signature(s) Signed: 04/06/2015 2:48:51 PM By: Elpidio EricAfful, Rita BSN, RN Entered By: Elpidio EricAfful, Rita on 04/06/2015 13:08:33 Traci IhaGROCE, [name] ([id number]) -------------------------------------------------------------------------------- Wound Assessment Details Patient Name: Traci IhaGROCE, [name] Date of Service: 04/06/2015 1:00 PM Medical Record Number: [id number] Patient Account Number: [id number] Date of Birth/Sex: 12/02/1940 (74 y.o. Female) Treating RN: [name], RN, BSN, [place] Sinkita Primary Care Physician: SYSTEM, PCP Other Clinician: Referring Physician: Lorie PhenixMaloney, Nancy Treating Physician/Extender: Traci ReBritto, Errol Weeks in Treatment: 3 Wound Status Wound Number: 1 Primary Pressure Ulcer Etiology: Wound Location: Left Calcaneous Wound Open Wounding Event: Pressure Injury Status: Date Acquired: 02/12/2015 Comorbid Anemia, Chronic Obstructive Pulmonary Weeks Of Treatment: 3 History: Disease (COPD), Sleep Apnea, Clustered Wound: No Arrhythmia, Congestive Heart Failure, Coronary Artery Disease, Hypertension, Peripheral Venous Disease, Type II Diabetes, Osteoarthritis, Neuropathy Photos Photo Uploaded By: Elpidio EricAfful, Rita on  04/06/2015 14:47:05 Wound Measurements Length: (cm) 1.7 Width: (cm) 2.7 Depth: (cm) 0.1 Area: (cm) 3.605 Volume: (cm) 0.36 % Reduction in Area: 49.6% % Reduction in Volume: 49.7% Epithelialization: Small (1-33%) Tunneling: No Undermining: No Wound Description Classification: Category/Stage III Foul Odor Af Diabetic Severity (Wagner): Grade 2 Due to Produ Wound Margin: Flat and Intact Exudate Amount: Large Exudate Type: Serous Exudate Color: amber ter Cleansing: Yes ct Use: No Wound Bed [place], [place] A. ([id number]) Granulation Amount: Large (67-100%) Exposed  Structure Granulation Quality: Red Fascia Exposed: No Necrotic Amount: None Present (0%) Fat Layer Exposed: No Tendon Exposed: No Muscle Exposed: No Joint Exposed: No Bone Exposed: No Limited to Skin Breakdown Periwound Skin Texture Texture Color No Abnormalities Noted: No No Abnormalities Noted: No Callus: No Atrophie Blanche: No Crepitus: No Cyanosis: No Excoriation: No Ecchymosis: No Fluctuance: No Erythema: No Friable: No Hemosiderin Staining: No Induration: No Mottled: No Localized Edema: No Pallor: No Rash: No Rubor: No Scarring: No Temperature / Pain Moisture Temperature: No Abnormality No Abnormalities Noted: No Dry / Scaly: No Maceration: Yes Moist: Yes Wound Preparation Ulcer Cleansing: Rinsed/Irrigated with Saline Topical Anesthetic Applied: Other: lidocaine 4%, Electronic Signature(s) Signed: 04/06/2015 2:48:51 PM By: Elpidio Eric BSN, RN Entered By: Elpidio Eric on 04/06/2015 13:11:12 [name] ([id number]) -------------------------------------------------------------------------------- Vitals Details Patient Name: Traci Iha A. Date of Service: 04/06/2015 1:00 PM Medical Record Number: [id number] Patient Account Number: [id number] Date of Birth/Sex: [birth year] (74 y.o. Female) Treating RN: [name], RN, BSN, Rita Primary Care Physician: SYSTEM, PCP Other Clinician: Referring Physician: Lorie Phenix Treating Physician/Extender: [name] in Treatment: 3 Vital Signs Time Taken: 13:08 Temperature (F): 98.2 Height (in): 62 Pulse (bpm): 67 Weight (lbs): 250 Respiratory Rate (breaths/min): 20 Body Mass Index (BMI): 45.7 Blood Pressure (mmHg): 140/55 Reference Range: 80 - 120 mg / dl Electronic Signature(s) Signed: 04/06/2015 2:48:51 PM By: Elpidio Eric BSN, RN Entered By: Elpidio Eric on 04/06/2015 13:08:50

## 2015-04-10 NOTE — Progress Notes (Signed)
[name], [name] ([id number]) Visit Report for 04/06/2015 Chief Complaint Document Details Patient Name: [name], [name]. Date of Service: 04/06/2015 1:00 PM Medical Record Number: [id number] Patient Account Number: [id number] Date of Birth/Sex: 1941-04-11 (74 y.o. Female) Treating RN: Primary Care Physician: SYSTEM, PCP Other Clinician: Referring Physician: Lorie Phenix Treating Physician/Extender: Rudene Re in Treatment: 3 Information Obtained from: Patient Chief Complaint Patient presents to the wound care center for [name] consult due non healing wound. 74 year old patient who comes with the chief complaints of having [name] ulcerated area on her left heel for about [name] month. Electronic Signature(s) Signed: 04/10/2015 12:24:54 PM By: Evlyn Kanner MD, FACS Entered By: Evlyn Kanner on 04/06/2015 13:22:50 [name] ([id number]) -------------------------------------------------------------------------------- HPI Details Patient Name: [name]. Date of Service: 04/06/2015 1:00 PM Medical Record Number: [id number] Patient Account Number: [id number] Date of Birth/Sex: 1941-06-05 (74 y.o. Female) Treating RN: Primary Care Physician: SYSTEM, PCP Other Clinician: Referring Physician: Lorie Phenix Treating Physician/Extender: Rudene Re in Treatment: 3 History of Present Illness Location: bilateral pressure injuries to both heels, left more than right Quality: Patient reports No Pain. Severity: Patient states wound are getting worse. Duration: Patient has had the wound for > 1 months prior to seeking treatment at the wound center Context: The wound appeared gradually over time Modifying Factors: Consults to this date include:some local care as per the nursing home staff Associated Signs and Symptoms: Patient reports having:no sensation on her feet whatsoever HPI Description: 74 year old patient who is known to have several comorbidities including Diabetes mellitus,  opoid dependence, cervical spondylosis, GERD, anemia, dermatitis, general debility and pressure ulcers of both heels is here for an opinion regarding the latter. She does not check her blood sugar every day and does not know the last blood values which were done about 2 weeks ago. she has been [name] smoker all her life and was smoking up to [name] pack and [name] half [name] day but now smokes 1 packet in 3 days. PAST MEDICAL HISTORY: DM type 2, COPD, OSA, hypothyroidism, hypertension, diabetes, hyperlipidemia, CHF, morbid obesity, previous history of tobacco abuse, coronary artery disease, paroxysmal atrial fibrillation on Xarelto. Electronic Signature(s) Signed: 04/10/2015 12:24:54 PM By: Evlyn Kanner MD, FACS Entered By: Evlyn Kanner on 04/06/2015 13:22:56 [name] ([id number]) -------------------------------------------------------------------------------- Physical Exam Details Patient Name: [name]. Date of Service: 04/06/2015 1:00 PM Medical Record Number: [id number] Patient Account Number: [id number] Date of Birth/Sex: [birth year] (74 y.o. Female) Treating RN: Primary Care Physician: SYSTEM, PCP Other Clinician: Referring Physician: Lorie Phenix Treating Physician/Extender: Rudene Re in Treatment: 3 Constitutional . Pulse regular. Respirations normal and unlabored. Afebrile. . Eyes Nonicteric. Reactive to light. Ears, Nose, Mouth, and Throat Lips, teeth, and gums WNL.Marland Kitchen Moist mucosa without lesions . Neck supple and nontender. No palpable supraclavicular or cervical adenopathy. Normal sized without goiter. Respiratory WNL. No retractions.. Cardiovascular Pedal Pulses WNL. No clubbing, cyanosis or edema. Chest Breasts symmetical and no nipple discharge.. Breast tissue WNL, no masses, lumps, or tenderness.. Gastrointestinal (GI) Abdomen without masses or tenderness.. No liver or spleen enlargement or tenderness.. Genitourinary (GU) No hydrocele, spermatocele,  tenderness of the cord, or testicular mass.Marland Kitchen Penis without lesions.[name] without lesions. No cystocele, or rectocele. Pelvic support intact, no discharge. Marland Kitchen Urethra without masses, tenderness or scarring.Marland Kitchen Lymphatic No adneopathy. No adenopathy. No adenopathy. Musculoskeletal Adexa without tenderness or enlargement.. Digits and nails w/o clubbing, cyanosis, infection, petechiae, ischemia, or inflammatory conditions.. Integumentary (Hair, Skin) She has some maceration of the skin around it  and I believe she's not been compliant with keeping the wound dry. The wound is very clean though and is healthy granulation tissue.Marland Kitchen No crepitus or fluctuance. No peri-wound warmth or erythema. No masses.Marland Kitchen Psychiatric Judgement and insight Intact.. No evidence of depression, anxiety, or agitation.. Notes She has some maceration of the skin around it and I believe she's not been compliant with keeping the wound dry. The wound is very clean though and is healthy granulation tissue. [name], [name] ([id number]) Electronic Signature(s) Signed: 04/10/2015 12:24:54 PM By: Evlyn Kanner MD, FACS Entered By: Evlyn Kanner on 04/06/2015 13:23:45 [name] ([id number]) -------------------------------------------------------------------------------- Physician Orders Details Patient Name: [name]. Date of Service: 04/06/2015 1:00 PM Medical Record Patient Account Number: [id number] Number: [name], RN, BSN, Treating RN: Sep 12, 1941 (73 y.o. [place] Date of Birth/Sex: Female) Other Clinician: Primary Care Physician: SYSTEM, PCP Treating Evlyn Kanner Referring Physician: Lorie Phenix Physician/Extender: Weeks in Treatment: 3 Verbal / Phone Orders: Yes Clinician: [name], RN, BSN, Rita Read Back and Verified: Yes Diagnosis Coding Wound Cleansing Wound #1 Left Calcaneous o Clean wound with Normal Saline. o May shower with protection. - may shower with dressing on and change  dressing after shower Anesthetic Wound #1 Left Calcaneous o Topical Lidocaine 4% cream applied to wound bed prior to debridement Skin Barriers/Peri-Wound Care Wound #1 Left Calcaneous o Barrier cream Primary Wound Dressing Wound #1 Left Calcaneous o Prisma Ag Secondary Dressing Wound #1 Left Calcaneous o Conform/Kerlix - and stretch netting as needed to keep dressing intact o Gauze and Kerlix/Conform Dressing Change Frequency Wound #1 Left Calcaneous o Change dressing every other day. Follow-up Appointments Wound #1 Left Calcaneous o Return Appointment in 1 week. Off-Loading Wound #1 Left Calcaneous o Heel suspension boot to: - sage boots [name], [name]. ([id number]) Electronic Signature(s) Signed: 04/06/2015 2:48:51 PM By: Elpidio Eric BSN, RN Signed: 04/10/2015 12:24:54 PM By: Evlyn Kanner MD, FACS Entered By: Elpidio Eric on 04/06/2015 13:21:07 [name] ([id number]) -------------------------------------------------------------------------------- Problem List Details Patient Name: [name]. Date of Service: 04/06/2015 1:00 PM Medical Record Number: [id number] Patient Account Number: [id number] Date of Birth/Sex: 10-16-1940 (74 y.o. 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 Date of Service: 04/06/2015 1:00 PM Medical Record Number: [id number] Patient Account Number: [id number] Date of Birth/Sex: 26-Oct-1940 (74 y.o. Female) Treating RN: Primary Care Physician: SYSTEM, PCP Other Clinician: Referring Physician: Lorie Phenix Treating Physician/Extender: Rudene Re in Treatment: 3 Subjective Chief Complaint Information obtained from Patient Patient presents to the wound care center for [name] consult due non healing wound. 74 year old patient who comes with the chief complaints of having [name] ulcerated area on her left heel for about [name] month. History of Present Illness (HPI) The following HPI elements were documented for the patient's wound: Location: bilateral pressure injuries to both heels, left more than right Quality: Patient reports  No Pain. Severity: Patient states wound are getting worse. Duration: Patient has had the wound for > 1 months prior to seeking treatment at the wound center Context: The wound appeared gradually over time Modifying Factors: Consults to this date include:some local care as per the nursing home staff Associated Signs and Symptoms: Patient reports having:no sensation on her feet whatsoever 74 year old patient who is known to have several comorbidities including Diabetes mellitus, opoid dependence, cervical spondylosis, GERD, anemia, dermatitis, general debility and pressure ulcers of both heels is here for an opinion regarding the latter. She does not check her blood sugar every day and does not know the last blood values which were done about 2 weeks ago. she has been [name] smoker all her life and was smoking up to [name] pack and [name] half [name] day but now smokes 1 packet in 3 days. PAST MEDICAL HISTORY: DM  type 2, COPD, OSA, hypothyroidism, hypertension, diabetes, hyperlipidemia, CHF, morbid obesity, previous history of tobacco abuse, coronary artery disease, paroxysmal atrial fibrillation on Xarelto. Objective Constitutional Pulse regular. Respirations normal and unlabored. Afebrile. Vitals Time Taken: 1:08 PM, Height: 62 in, Weight: 250 lbs, BMI: 45.7, Temperature: 98.2 F, Pulse: 67 [name], [name]. ([id number]) bpm, Respiratory Rate: 20 breaths/min, Blood Pressure: 140/55 mmHg. Eyes Nonicteric. Reactive to light. Ears, Nose, Mouth, and Throat Lips, teeth, and gums WNL.Marland Kitchen. Moist mucosa without lesions . Neck supple and nontender. No palpable supraclavicular or cervical adenopathy. Normal sized without goiter. Respiratory WNL. No retractions.. Cardiovascular Pedal Pulses WNL. No clubbing, cyanosis or edema. Chest Breasts symmetical and no nipple discharge.. Breast tissue WNL, no masses, lumps, or tenderness.. Gastrointestinal (GI) Abdomen without masses or tenderness.. No liver or spleen enlargement or tenderness.. Genitourinary (GU) No hydrocele, spermatocele, tenderness of the cord, or testicular mass.Marland Kitchen. Penis without lesions.[name]. Genitalia without lesions. No cystocele, or rectocele. Pelvic support intact, no discharge. Marland Kitchen. Urethra without masses, tenderness or scarring.Marland Kitchen. Lymphatic No adneopathy. No adenopathy. No adenopathy. Musculoskeletal Adexa without tenderness or enlargement.. Digits and nails w/o clubbing, cyanosis, infection, petechiae, ischemia, or inflammatory conditions.Marland Kitchen. Psychiatric Judgement and insight Intact.. No evidence of depression, anxiety, or agitation.. General Notes: She has some maceration of the skin around it and I believe she's not been compliant with keeping the wound dry. The wound is very clean though and is healthy granulation tissue. Integumentary (Hair, Skin) She has some maceration of the skin around it and I believe she's not been compliant with  keeping the wound dry. The wound is very clean though and is healthy granulation tissue.Marland Kitchen. No crepitus or fluctuance. No peri-wound warmth or erythema. No masses.. Wound #1 status is Open. Original cause of wound was Pressure Injury. The wound is located on the Left Calcaneous. The wound measures 1.7cm length x 2.7cm width x 0.1cm depth; 3.605cm^2 area and 0.36cm^3 volume. The wound is limited to skin breakdown. There is no tunneling or undermining noted. Traci IhaGROCE, [name]. ([id number]) There is [name] large amount of serous drainage noted. The wound margin is flat and intact. There is large (67- 100%) red granulation within the wound bed. There is no necrotic tissue within the wound bed. The periwound skin appearance exhibited: Maceration, Moist. The periwound skin appearance did not exhibit: Callus, Crepitus, Excoriation, Fluctuance, Friable, Induration, Localized Edema, Rash, Scarring, Dry/Scaly, Atrophie Blanche, Cyanosis, Ecchymosis, Hemosiderin Staining, Mottled, Pallor, Rubor, Erythema. Periwound temperature was noted as No Abnormality. Assessment Active Problems ICD-10 E11.621 - Type 2 diabetes mellitus with foot ulcer E08.42 - Diabetes mellitus due to underlying condition with diabetic  polyneuropathy N82.956 - Pressure ulcer of left heel, stage 3 E66.01 - Morbid (severe) obesity due to excess calories F17.218 - Nicotine dependence, cigarettes, with other nicotine-induced disorders We will change to daily dressings with Prisma and Desitin to the sides of the wound. We have emphasized offloading as much as possible and she will come back and see as next week. Plan Wound Cleansing: Wound #1 Left Calcaneous: Clean wound with Normal Saline. May shower with protection. - may shower with dressing on and change dressing after shower Anesthetic: Wound #1 Left Calcaneous: Topical Lidocaine 4% cream applied to wound bed prior to debridement Skin Barriers/Peri-Wound Care: Wound #1 Left  Calcaneous: Barrier cream Primary Wound Dressing: Wound #1 Left Calcaneous: Prisma Ag Secondary Dressing: Wound #1 Left Calcaneous: Conform/Kerlix - and stretch netting as needed to keep dressing intact Gauze and Kerlix/Conform Dressing Change Frequency: [name], [name]. ([id number]) Wound #1 Left Calcaneous: Change dressing every other day. Follow-up Appointments: Wound #1 Left Calcaneous: Return Appointment in 1 week. Off-Loading: Wound #1 Left Calcaneous: Heel suspension boot to: - sage boots Electronic Signature(s) Signed: 04/10/2015 12:24:54 PM By: Evlyn Kanner MD, FACS Entered By: Evlyn Kanner on 04/06/2015 13:24:29 [name] ([id number]) -------------------------------------------------------------------------------- SuperBill Details Patient Name: [name]. Date of Service: 04/06/2015 Medical Record Number: [id number] Patient Account Number: [id number] Date of Birth/Sex: 1941/07/09 (74 y.o. 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 L89.623 Pressure ulcer of left heel, stage 3 E66.01 Morbid (severe) obesity due to excess calories F17.218 Nicotine dependence, cigarettes, with other nicotine-induced disorders Physician Procedures CPT4 Code Description: [id number] - WC PHYS LEVEL 3 - EST PT ICD-10 Description Diagnosis E11.621 Type 2 diabetes mellitus with foot ulcer E08.42 Diabetes mellitus due to underlying condition with L89.623 Pressure ulcer of left heel, stage 3 E66.01  Morbid (severe) obesity due to excess calories Modifier: diabetic poly Quantity: 1 neuropathy Electronic Signature(s) Signed: 04/10/2015 12:24:54 PM By: Evlyn Kanner MD,  FACS Entered By: Evlyn Kanner on 04/06/2015 13:24:47

## 2015-04-11 ENCOUNTER — Encounter: Payer: Self-pay | Admitting: Cardiovascular Disease

## 2015-04-11 ENCOUNTER — Ambulatory Visit (INDEPENDENT_AMBULATORY_CARE_PROVIDER_SITE_OTHER): Payer: Medicare Other | Admitting: Cardiovascular Disease

## 2015-04-11 VITALS — BP 148/83 | HR 73 | Ht 65.0 in | Wt 255.0 lb

## 2015-04-11 DIAGNOSIS — I5032 Chronic diastolic (congestive) heart failure: Secondary | ICD-10-CM

## 2015-04-11 DIAGNOSIS — I2510 Atherosclerotic heart disease of native coronary artery without angina pectoris: Secondary | ICD-10-CM | POA: Diagnosis not present

## 2015-04-11 DIAGNOSIS — Z9989 Dependence on other enabling machines and devices: Secondary | ICD-10-CM

## 2015-04-11 DIAGNOSIS — E785 Hyperlipidemia, unspecified: Secondary | ICD-10-CM

## 2015-04-11 DIAGNOSIS — I4892 Unspecified atrial flutter: Secondary | ICD-10-CM

## 2015-04-11 DIAGNOSIS — R0602 Shortness of breath: Secondary | ICD-10-CM

## 2015-04-11 DIAGNOSIS — G4733 Obstructive sleep apnea (adult) (pediatric): Secondary | ICD-10-CM

## 2015-04-11 NOTE — Assessment & Plan Note (Signed)
Recommend she continue on her simvastatin 10 mg daily

## 2015-04-11 NOTE — Progress Notes (Signed)
Patient ID: [name], female    DOB: [birth year], 74 y.o.   MRN: [id number]  HPI Comments: 74 year old female with CAD, neuropathy in the feet, recent episodes of mental status changes and unresponsiveness secondary to polypharmacy, seen at [facility] with cardiac catheterization performed for elevated cardiac enzymes showing a totally occluded RCA that was chronic, mid circumflex with 95% stenosis just before OM-1, 50% mid LAD,  EF was 55%. PCI performed on August 16 with DES stent to the left circumflex, additional episodes of unresponsiveness from her medications, one of the episodes requiring intubation for airway protection, also with episodes of atrial flutter, who presents to the clinic for routine followup of her chronic diastolic CHF . Previous admissions to the hospital for mental status changes felt to be secondary to polypharmacy. Also has a history of mild CO2 retention, obstructive sleep apnea. She continues to smoke 6 or 7 cigarettes per day  In follow-up today she reports having hospital admission 01/09/2015 for acute respiratory failure, requiring ventilation. Discharge summary details encephalopathy from early tract infection, sepsis, possible pneumonia on CT scan, treated with  ABX Initial creatinine 1.8, this improved down to normal at the time of her discharge Since then she has been maintained on Lasix 40 mg twice a day Recent lab work 03/27/2015 shows creatinine up to 1.6, BUN 40 she reports her mouth is dry, no leg edema. She is followed by Dr. Meredeth Ide for obstructive sleep apnea, on CPAP She continues to have smothering at nighttime, difficulty breathing when she lay supine Weight is down from her prior clinic visit She has a sore on her left heel, nonhealing  EKG on today's visit shows normal sinus rhythm with rate 73 bpm, no significant ST or T-wave changes  Other past medical history  Admission to the hospital April 2014 with shortness of breath, atrial fibrillation,  acute diastolic CHF, urinary tract infection. She did convert to normal sinus rhythm in the hospital after given Cardizem IV She lives at [facility].  does not walk secondary to lower extremity neuropathy and weakness. significant restless leg symptoms during the daytime.   Previous cardioversion for atrial flutter, restoring normal sinus rhythm            Allergies  Allergen Reactions  . Amiodarone     Intolerance   . Latex     Outpatient Encounter Prescriptions as of 10/10/2014  Medication Sig  . acetaminophen (TYLENOL) 325 MG tablet Take 650 mg by mouth every 6 (six) hours as needed.  Marland Kitchen albuterol (PROVENTIL) (2.5 MG/3ML) 0.083% nebulizer solution Take 2.5 mg by nebulization every 4 (four) hours as needed for wheezing or shortness of breath.  Marland Kitchen amiodarone (PACERONE) 200 MG tablet Take 200 mg by mouth 2 (two) times daily.  Marland Kitchen aspirin 81 MG tablet Take 81 mg by mouth daily.  Marland Kitchen buPROPion (WELLBUTRIN) 75 MG tablet Take 75 mg by mouth daily.  . Cholecalciferol (VITAMIN D3) 50000 UNITS CAPS Take by mouth once a week.  . collagenase (SANTYL) ointment Apply 1 application topically daily.  Marland Kitchen docusate sodium (COLACE) 100 MG capsule Take 100 mg by mouth every 12 (twelve) hours.  . DULoxetine (CYMBALTA) 60 MG capsule Take 30 mg by mouth 2 (two) times daily.   . Fluticasone-Salmeterol (ADVAIR DISKUS IN) Inhale 1 puff into the lungs 2 (two) times daily.  . furosemide (LASIX) 20 MG tablet Take 20 mg by mouth 2 (two) times daily.   Marland Kitchen gabapentin (NEURONTIN) 100 MG capsule Take 100 mg by  mouth 2 (two) times daily.   Marland Kitchen guaiFENesin (MUCINEX) 600 MG 12 hr tablet Take 1,200 mg by mouth every 12 (twelve) hours.  . hydrocerin (EUCERIN) CREA Apply 1 application topically 2 (two) times daily.  . Hypromellose (ARTIFICIAL TEARS) 0.4 % SOLN Apply to eye.  . levothyroxine (SYNTHROID, LEVOTHROID) 200 MCG tablet Take 200 mcg by mouth daily before breakfast.  . loperamide (IMODIUM A-D) 2 MG tablet Take 2 mg  by mouth 4 (four) times daily as needed for diarrhea or loose stools.  . Melatonin 1 MG TABS Take by mouth at bedtime.  . metFORMIN (GLUCOPHAGE) 500 MG tablet Take 250 mg by mouth daily with breakfast.   . metoprolol tartrate (LOPRESSOR) 25 MG tablet Take 25 mg by mouth 2 (two) times daily.   . montelukast (SINGULAIR) 10 MG tablet Take 10 mg by mouth at bedtime.  . Multiple Vitamin (MULTIVITAMIN) tablet Take 1 tablet by mouth daily.    Marland Kitchen omeprazole (PRILOSEC) 20 MG capsule Take 20 mg by mouth daily.  . ondansetron (ZOFRAN) 4 MG tablet Take 4 mg by mouth every 6 (six) hours as needed for nausea or vomiting.  Marland Kitchen oxyCODONE-acetaminophen (PERCOCET) 5-325 MG per tablet Take 1 tablet by mouth every 4 (four) hours as needed.    . potassium chloride (K-DUR,KLOR-CON) 10 MEQ tablet Take 20 mEq by mouth daily.   . Probiotic Product (ALIGN) 4 MG CAPS Take 4 mg by mouth at bedtime.  . Rivaroxaban (XARELTO) 20 MG TABS tablet Take 20 mg by mouth daily with supper.  Marland Kitchen rOPINIRole (REQUIP) 1 MG tablet Take 1 mg by mouth daily.   Marland Kitchen rOPINIRole (REQUIP) 2 MG tablet Take 2 mg by mouth at bedtime.  . simvastatin (ZOCOR) 10 MG tablet Take 10 mg by mouth daily.  . tamsulosin (FLOMAX) 0.4 MG CAPS capsule Take 0.4 mg by mouth daily.  Marland Kitchen tiotropium (SPIRIVA) 18 MCG inhalation capsule Place 18 mcg into inhaler and inhale daily.  . traZODone (DESYREL) 100 MG tablet Take 100 mg by mouth at bedtime.    Past Medical History  Diagnosis Date  . Thyroid disease   . Depression   . Hypertension   . Lumbar disc disease   . Cervical disc disease   . Dyslipidemia   . History of ETOH abuse     dependence with withdrawl seizures  . MI, acute, non ST segment elevation   . Sleep apnea   . Neuropathy   . Anemia   . GERD (gastroesophageal reflux disease)   . Diabetes mellitus without complication   . Restless leg syndrome   . Unspecified psychosis   . CHF (congestive heart failure)   . COPD (chronic obstructive pulmonary  disease)   . Atrial fibrillation   . Acute respiratory failure   . Hyperlipidemia     Past Surgical History  Procedure Laterality Date  . Cervical disc surgery  03/2011    [facility]  . Thyroidectomy    . Tonsillectomy    . Replacement total knee      right knee  . Cardiac catheterization  05/2011    [facility] by Dr Mariah Milling  . Coronary angioplasty  05/2011    stent  . Cardioversion    . Colonoscopy      Social History  reports that she has been smoking Cigarettes.  She has a 40.5 pack-year smoking history. She has never used smokeless tobacco. She reports that she does not drink alcohol or use illicit drugs.  Family History family history  includes Cancer in her father; Hepatitis in her brother; Stroke in her mother.   Review of Systems  HENT: Negative.   Respiratory: Negative.   Cardiovascular: Negative.   Gastrointestinal: Negative.   Musculoskeletal: Positive for back pain, arthralgias and gait problem.  Neurological: Positive for weakness.  Hematological: Negative.   Psychiatric/Behavioral: Negative.   All other systems reviewed and are negative.  BP 148/83 mmHg  Pulse 73  Ht  (1.651 m)  Wt 255 lb (115.667 kg)  BMI 42.43 kg/m2  Physical Exam  Constitutional: She is oriented to person, place, and time. She appears well-developed and well-nourished.  Presenting in a wheelchair  HENT:  Head: Normocephalic.  Nose: Nose normal.  Mouth/Throat: Oropharynx is clear and moist.  Eyes: Conjunctivae are normal. Pupils are equal, round, and reactive to light.  Neck: Normal range of motion. Neck supple. No JVD present.  Cardiovascular: Normal rate, regular rhythm, S1 normal, S2 normal, normal heart sounds and intact distal pulses.  Exam reveals no gallop and no friction rub.   No murmur heard. Trace pitting edema to below the knees bilaterally  Pulmonary/Chest: Effort normal and breath sounds normal. No respiratory distress. She has no wheezes. She has no rales. She  exhibits no tenderness.  Abdominal: Soft. Bowel sounds are normal. She exhibits no distension. There is no tenderness.  Musculoskeletal: Normal range of motion. She exhibits no edema or tenderness.  Lymphadenopathy:    She has no cervical adenopathy.  Neurological: She is alert and oriented to person, place, and time. Coordination normal.  Skin: Skin is warm and dry. No rash noted. No erythema.  Psychiatric: She has a normal mood and affect. Her behavior is normal. Judgment and thought content normal.    Assessment and Plan  Nursing note and vitals reviewed.

## 2015-04-11 NOTE — Assessment & Plan Note (Signed)
Recommended she hold her Lasix on Sundays. She is currently taking [facility] On her prior clinic visit was taking 20 mg. Recent creatinine elevated at 1.6 with elevated BUN.

## 2015-04-11 NOTE — Assessment & Plan Note (Signed)
Maintaining normal sinus rhythm We will continue amiodarone metoprolol

## 2015-04-11 NOTE — Assessment & Plan Note (Signed)
Currently with no symptoms of angina. No further workup at this time. Continue current medication regimen. 

## 2015-04-11 NOTE — Patient Instructions (Signed)
You are doing well. You are mildly dehydrated, Please hold the lasix and potassium on Sundays  Please call us if you have new issues that need to be addressed before your next appt.  Your physician wants you to follow-up in: 6 months.  You will receive a reminder letter in the mail two months in advance. If you don't receive a letter, please call our office to schedule the follow-up appointment.

## 2015-04-11 NOTE — Assessment & Plan Note (Signed)
Numerous episodes of encephalopathy, respiratory distress in the past requiring hospitalization. She reports a smothering at nighttime, difficulty breathing. I suspect she may do better on BiPAP. Recommended she discuss this with Dr. Meredeth IdeFleming

## 2015-04-13 ENCOUNTER — Ambulatory Visit: Payer: Medicare Other | Admitting: Surgery

## 2015-04-16 ENCOUNTER — Encounter: Payer: Medicare Other | Admitting: Surgery

## 2015-04-16 DIAGNOSIS — I10 Essential (primary) hypertension: Secondary | ICD-10-CM | POA: Diagnosis not present

## 2015-04-16 DIAGNOSIS — E785 Hyperlipidemia, unspecified: Secondary | ICD-10-CM | POA: Diagnosis not present

## 2015-04-16 DIAGNOSIS — E11621 Type 2 diabetes mellitus with foot ulcer: Secondary | ICD-10-CM | POA: Diagnosis not present

## 2015-04-16 DIAGNOSIS — J449 Chronic obstructive pulmonary disease, unspecified: Secondary | ICD-10-CM | POA: Diagnosis not present

## 2015-04-16 DIAGNOSIS — L89623 Pressure ulcer of left heel, stage 3: Secondary | ICD-10-CM | POA: Diagnosis not present

## 2015-04-16 DIAGNOSIS — I509 Heart failure, unspecified: Secondary | ICD-10-CM | POA: Diagnosis not present

## 2015-04-16 DIAGNOSIS — E1142 Type 2 diabetes mellitus with diabetic polyneuropathy: Secondary | ICD-10-CM | POA: Diagnosis not present

## 2015-04-16 DIAGNOSIS — E039 Hypothyroidism, unspecified: Secondary | ICD-10-CM | POA: Diagnosis not present

## 2015-04-16 DIAGNOSIS — F17218 Nicotine dependence, cigarettes, with other nicotine-induced disorders: Secondary | ICD-10-CM | POA: Diagnosis not present

## 2015-04-16 DIAGNOSIS — I480 Paroxysmal atrial fibrillation: Secondary | ICD-10-CM | POA: Diagnosis not present

## 2015-04-16 DIAGNOSIS — I2510 Atherosclerotic heart disease of native coronary artery without angina pectoris: Secondary | ICD-10-CM | POA: Diagnosis not present

## 2015-04-17 NOTE — Progress Notes (Signed)
[name], [name] ([id number]) Visit Report for 04/16/2015 Chief Complaint Document Details Patient Name: [name], [name] Date of Service: 04/16/2015 1:00 PM Medical Record Number: [id number] Patient Account Number: [id number] Date of Birth/Sex: September 12, 1941 (74 y.o. Female) Treating RN: Primary Care Physician: SYSTEM, PCP Other Clinician: Referring Physician: Lorie Phenix Treating Physician/Extender: Rudene Re in Treatment: 4 Information Obtained from: Patient Chief Complaint Patient presents to the wound care center for a consult due non healing wound. 74 year old patient who comes with the chief complaints of having a ulcerated area on her left heel for about a month. Electronic Signature(s) Signed: 04/16/2015 1:27:40 PM By: Evlyn Kanner MD, FACS Entered By: Evlyn Kanner on 04/16/2015 13:27:40 [name] ([id number]) -------------------------------------------------------------------------------- HPI Details Patient Name: Traci Iha A. Date of Service: 04/16/2015 1:00 PM Medical Record Number: [id number] Patient Account Number: [id number] Date of Birth/Sex: 1940-12-30 (74 y.o. Female) Treating RN: Primary Care Physician: SYSTEM, PCP Other Clinician: Referring Physician: Lorie Phenix Treating Physician/Extender: Rudene Re in Treatment: 4 History of Present Illness Location: bilateral pressure injuries to both heels, left more than right Quality: Patient reports No Pain. Severity: Patient states wound are getting worse. Duration: Patient has had the wound for > 1 months prior to seeking treatment at the wound center Context: The wound appeared gradually over time Modifying Factors: Consults to this date include:some local care as per the nursing home staff Associated Signs and Symptoms: Patient reports having:no sensation on her feet whatsoever HPI Description: 74 year old patient who is known to have several comorbidities including  Diabetes mellitus, opoid dependence, cervical spondylosis, GERD, anemia, dermatitis, general debility and pressure ulcers of both heels is here for an opinion regarding the latter. She does not check her blood sugar every day and does not know the last blood values which were done about 2 weeks ago. she has been a smoker all her life and was smoking up to a pack and a half a day but now smokes 1 packet in 3 days. PAST MEDICAL HISTORY: DM type 2, COPD, OSA, hypothyroidism, hypertension, diabetes, hyperlipidemia, CHF, morbid obesity, previous history of tobacco abuse, coronary artery disease, paroxysmal atrial fibrillation on Xarelto. Electronic Signature(s) Signed: 04/16/2015 1:27:46 PM By: Evlyn Kanner MD, FACS Entered By: Evlyn Kanner on 04/16/2015 13:27:45 [name] ([id number]) -------------------------------------------------------------------------------- Physical Exam Details Patient Name: Traci Iha A. Date of Service: 04/16/2015 1:00 PM Medical Record Number: [id number] Patient Account Number: [id number] Date of Birth/Sex: 1941/01/27 (74 y.o. Female) Treating RN: Primary Care Physician: SYSTEM, PCP Other Clinician: Referring Physician: Lorie Phenix Treating Physician/Extender: Rudene Re in Treatment: 4 Constitutional . Pulse regular. Respirations normal and unlabored. Afebrile. . Eyes Nonicteric. Reactive to light. Ears, Nose, Mouth, and Throat Lips, teeth, and gums WNL.Marland Kitchen Moist mucosa without lesions . Neck supple and nontender. No palpable supraclavicular or cervical adenopathy. Normal sized without goiter. Respiratory WNL. No retractions.. Cardiovascular Pedal Pulses WNL. No clubbing, cyanosis or edema. Integumentary (Hair, Skin) No suspicious lesions. No crepitus or fluctuance. No peri-wound warmth or erythema. No masses.Marland Kitchen Psychiatric Judgement and insight Intact.. No evidence of depression, anxiety, or agitation.. Notes The maceration is much  better this week and there is some epithelization and the wound is looking smaller and cleaner. Electronic Signature(s) Signed: 04/16/2015 1:28:33 PM By: Evlyn Kanner MD, FACS Entered By: Evlyn Kanner on 04/16/2015 13:28:32 [name] ([id number]) -------------------------------------------------------------------------------- Physician Orders Details Patient Name: Traci Iha A. Date of Service: 04/16/2015 1:00 PM Medical Record Number: [id number] Patient Account Number: [id number] Date of Birth/Sex: Feb 23, 1941 (74  y.o. Female) Treating RN: Huel CoventryWoody, Kim Primary Care Physician: SYSTEM, PCP Other Clinician: Referring Physician: Lorie PhenixMaloney, Nancy Treating Physician/Extender: Rudene ReBritto, Gwendelyn Lanting Weeks in Treatment: 4 Verbal / Phone Orders: Yes Clinician: Huel CoventryWoody, Kim Read Back and Verified: Yes Diagnosis Coding Wound Cleansing Wound #1 Left Calcaneous o Clean wound with Normal Saline. o May shower with protection. - may shower with dressing on and change dressing after shower Anesthetic Wound #1 Left Calcaneous o Topical Lidocaine 4% cream applied to wound bed prior to debridement Skin Barriers/Peri-Wound Care Wound #1 Left Calcaneous o Barrier cream Primary Wound Dressing Wound #1 Left Calcaneous o Prisma Ag Secondary Dressing Wound #1 Left Calcaneous o Conform/Kerlix - and stretch netting as needed to keep dressing intact o Gauze and Kerlix/Conform Dressing Change Frequency Wound #1 Left Calcaneous o Change dressing every other day. Follow-up Appointments Wound #1 Left Calcaneous o Return Appointment in 1 week. Off-Loading Wound #1 Left Calcaneous o Heel suspension boot to: - sage boots Traci GiovanniGROCE, [name] ([id number]) Electronic Signature(s) Signed: 04/16/2015 4:09:13 PM By: Evlyn KannerBritto, Harrel Ferrone MD, FACS Signed: 04/16/2015 5:40:40 PM By: Elliot GurneyWoody, RN, BSN, Kim RN, BSN Entered By: Elliot GurneyWoody, RN, BSN, Kim on 04/16/2015 13:25:36 Traci GiovanniGROCE, [name]  ([id number]) -------------------------------------------------------------------------------- Problem List Details Patient Name: Traci IhaGROCE, [name] Date of Service: 04/16/2015 1:00 PM Medical Record Number: [id number] Patient Account Number: [id number] Date of Birth/Sex: 12/01/1940 (74 y.o. Female) Treating RN: Primary Care Physician: SYSTEM, PCP Other Clinician: Referring Physician: Lorie PhenixMaloney, Nancy Treating Physician/Extender: Rudene ReBritto, Jalexia Lalli Weeks in Treatment: 4 Active Problems ICD-10 Encounter Code Description Active Date Diagnosis E11.621 Type 2 diabetes mellitus with foot ulcer 03/16/2015 Yes E08.42 Diabetes mellitus due to underlying condition with diabetic 03/16/2015 Yes polyneuropathy L89.623 Pressure ulcer of left heel, stage 3 03/16/2015 Yes E66.01 Morbid (severe) obesity due to excess calories 03/16/2015 Yes F17.218 Nicotine dependence, cigarettes, with other nicotine- 03/16/2015 Yes induced disorders Inactive Problems Resolved Problems Electronic Signature(s) Signed: 04/16/2015 1:27:17 PM By: Evlyn KannerBritto, Leesha Veno MD, FACS Entered By: Evlyn KannerBritto, Dorin Stooksbury on 04/16/2015 13:27:17 Traci GiovanniGROCE, [name] ([id number]) -------------------------------------------------------------------------------- Progress Note Details Patient Name: Traci IhaGROCE, [name] Date of Service: 04/16/2015 1:00 PM Medical Record Number: [id number] Patient Account Number: [id number] Date of Birth/Sex: 03/17/1941 (74 y.o. Female) Treating RN: Primary Care Physician: SYSTEM, PCP Other Clinician: Referring Physician: Lorie PhenixMaloney, Nancy Treating Physician/Extender: Rudene ReBritto, Tychelle Purkey Weeks in Treatment: 4 Subjective Chief Complaint Information obtained from Patient Patient presents to the wound care center for a consult due non healing wound. 74 year old patient who comes with the chief complaints of having a ulcerated area on her left heel for about a month. History of Present Illness (HPI) The following HPI elements were documented for the  patient's wound: Location: bilateral pressure injuries to both heels, left more than right Quality: Patient reports No Pain. Severity: Patient states wound are getting worse. Duration: Patient has had the wound for > 1 months prior to seeking treatment at the wound center Context: The wound appeared gradually over time Modifying Factors: Consults to this date include:some local care as per the nursing home staff Associated Signs and Symptoms: Patient reports having:no sensation on her feet whatsoever 74 year old patient who is known to have several comorbidities including Diabetes mellitus, opoid dependence, cervical spondylosis, GERD, anemia, dermatitis, general debility and pressure ulcers of both heels is here for an opinion regarding the latter. She does not check her blood sugar every day and does not know the last blood values which were done about 2 weeks ago. she has been a smoker all her life and was smoking up to  a pack and a half a day but now smokes 1 packet in 3 days. PAST MEDICAL HISTORY: DM type 2, COPD, OSA, hypothyroidism, hypertension, diabetes, hyperlipidemia, CHF, morbid obesity, previous history of tobacco abuse, coronary artery disease, paroxysmal atrial fibrillation on Xarelto. Objective Constitutional Pulse regular. Respirations normal and unlabored. Afebrile. Vitals Time Taken: 1:17 PM, Height: 62 in, Weight: 250 lbs, BMI: 45.7, Temperature: 98.4 F, Pulse: 64 [name], [name] ([id number]) bpm, Respiratory Rate: 18 breaths/min, Blood Pressure: 146/47 mmHg. Eyes Nonicteric. Reactive to light. Ears, Nose, Mouth, and Throat Lips, teeth, and gums WNL.Marland Kitchen Moist mucosa without lesions . Neck supple and nontender. No palpable supraclavicular or cervical adenopathy. Normal sized without goiter. Respiratory WNL. No retractions.. Cardiovascular Pedal Pulses WNL. No clubbing, cyanosis or edema. Psychiatric Judgement and insight Intact.. No evidence of depression,  anxiety, or agitation.. General Notes: The maceration is much better this week and there is some epithelization and the wound is looking smaller and cleaner. Integumentary (Hair, Skin) No suspicious lesions. No crepitus or fluctuance. No peri-wound warmth or erythema. No masses.. Wound #1 status is Open. Original cause of wound was Pressure Injury. The wound is located on the Left Calcaneous. The wound measures 1.5cm length x 2.2cm width x 0.1cm depth; 2.592cm^2 area and 0.259cm^3 volume. The wound is limited to skin breakdown. There is a large amount of serous drainage noted. The wound margin is flat and intact. There is medium (34-66%) pink granulation within the wound bed. There is a medium (34-66%) amount of necrotic tissue within the wound bed including Eschar. The periwound skin appearance exhibited: Moist. The periwound skin appearance did not exhibit: Callus, Crepitus, Excoriation, Fluctuance, Friable, Induration, Localized Edema, Rash, Scarring, Dry/Scaly, Maceration, Atrophie Blanche, Cyanosis, Ecchymosis, Hemosiderin Staining, Mottled, Pallor, Rubor, Erythema. Periwound temperature was noted as No Abnormality. Assessment Active Problems ICD-10 E11.621 - Type 2 diabetes mellitus with foot ulcer E08.42 - Diabetes mellitus due to underlying condition with diabetic polyneuropathy L89.623 - Pressure ulcer of left heel, stage 3 E66.01 - Morbid (severe) obesity due to excess calories [name], [name] ([id number]) F17.218 - Nicotine dependence, cigarettes, with other nicotine-induced disorders We will continue to use Prisma AG and Desitin around the wound and use offloading techniques as emphasized with her before. She will come back and see me next week. Plan Wound Cleansing: Wound #1 Left Calcaneous: Clean wound with Normal Saline. May shower with protection. - may shower with dressing on and change dressing after shower Anesthetic: Wound #1 Left Calcaneous: Topical Lidocaine 4%  cream applied to wound bed prior to debridement Skin Barriers/Peri-Wound Care: Wound #1 Left Calcaneous: Barrier cream Primary Wound Dressing: Wound #1 Left Calcaneous: Prisma Ag Secondary Dressing: Wound #1 Left Calcaneous: Conform/Kerlix - and stretch netting as needed to keep dressing intact Gauze and Kerlix/Conform Dressing Change Frequency: Wound #1 Left Calcaneous: Change dressing every other day. Follow-up Appointments: Wound #1 Left Calcaneous: Return Appointment in 1 week. Off-Loading: Wound #1 Left Calcaneous: Heel suspension boot to: - sage boots We will continue to use Prisma AG and Desitin around the wound and use offloading techniques as emphasized with her before. She will come back and see me next week. [name], [name] ([id number]) Electronic Signature(s) Signed: 04/16/2015 1:31:50 PM By: Evlyn Kanner MD, FACS Previous Signature: 04/16/2015 1:30:24 PM Version By: Evlyn Kanner MD, FACS Entered By: Evlyn Kanner on 04/16/2015 13:31:50 [name] ([id number]) -------------------------------------------------------------------------------- SuperBill Details Patient Name: Traci Iha A. Date of Service: 04/16/2015 Medical Record Number: [id number] Patient Account Number: [id number] Date of Birth/Sex:  [birth year] (74 y.o. Female) Treating RN: Primary Care Physician: SYSTEM, PCP Other Clinician: Referring Physician: Lorie Phenix Treating Physician/Extender: Rudene Re in Treatment: 4 Diagnosis Coding ICD-10 Codes Code Description E11.621 Type 2 diabetes mellitus with foot ulcer E08.42 Diabetes mellitus due to underlying condition with diabetic polyneuropathy L89.623 Pressure ulcer of left heel, stage 3 E66.01 Morbid (severe) obesity due to excess calories F17.218 Nicotine dependence, cigarettes, with other nicotine-induced disorders Facility Procedures CPT4 Code: 16109604 Description: [phone] - WOUND CARE VISIT-LEV 2 EST PT Modifier: Quantity:  1 Physician Procedures CPT4 Code: 1191478 Description: 99213 - WC PHYS LEVEL 3 - EST PT ICD-10 Description Diagnosis E11.621 Type 2 diabetes mellitus with foot ulcer L89.623 Pressure ulcer of left heel, stage 3 Modifier: Quantity: 1 Electronic Signature(s) Signed: 04/16/2015 1:31:34 PM By: Evlyn Kanner MD, FACS Previous Signature: 04/16/2015 1:31:02 PM Version By: Evlyn Kanner MD, FACS Entered By: Evlyn Kanner on 04/16/2015 13:31:34

## 2015-04-17 NOTE — Progress Notes (Signed)
[name], [name] ([id number]) Visit Report for 04/16/2015 Arrival Information Details Patient Name: [name], [name] Date of Service: 04/16/2015 1:00 PM Medical Record Number: [id number] Patient Account Number: [id number] Date of Birth/Sex: 08/05/1941 (74 y.o. Female) Treating RN: Huel CoventryWoody, Kim Primary Care Physician: SYSTEM, PCP Other Clinician: Referring Physician: Lorie PhenixMaloney, Nancy Treating Physician/Extender: Rudene ReBritto, Errol Weeks in Treatment: 4 Visit Information History Since Last Visit Added or deleted any medications: No Patient Arrived: Wheel Chair Any new allergies or adverse reactions: No Arrival Time: 13:04 Had a fall or experienced change in No Accompanied By: self activities of daily living that may affect Transfer Assistance: Manual risk of falls: Patient Identification Verified: Yes Signs or symptoms of abuse/neglect since No Secondary Verification Process Yes last visito Completed: Hospitalized since last visit: No Patient Has Alerts: Yes Has Dressing in Place as Prescribed: Yes Patient Alerts: Patient on Blood Has Footwear/Offloading in Place as Yes Thinner Prescribed: DMII Right: Other:sage xarelto, asa boot Pain Present Now: No Electronic Signature(s) Signed: 04/16/2015 5:40:40 PM By: Elliot GurneyWoody, RN, BSN, Kim RN, BSN Entered By: Elliot GurneyWoody, RN, BSN, Kim on 04/16/2015 13:16:56 [name], [name] ([id number]) -------------------------------------------------------------------------------- Clinic Level of Care Assessment Details Patient Name: [name], [name] Date of Service: 04/16/2015 1:00 PM Medical Record Number: [id number] Patient Account Number: [id number] Date of Birth/Sex: 04/17/1941 (74 y.o. Female) Treating RN: Huel CoventryWoody, Kim Primary Care Physician: SYSTEM, PCP Other Clinician: Referring Physician: Lorie PhenixMaloney, Nancy Treating Physician/Extender: Rudene ReBritto, Errol Weeks in Treatment: 4 Clinic Level of Care Assessment Items TOOL 4 Quantity Score []  - Use when only an EandM is  performed on FOLLOW-UP visit 0 ASSESSMENTS - Nursing Assessment / Reassessment []  - Reassessment of Co-morbidities (includes updates in patient status) 0 X - Reassessment of Adherence to Treatment Plan 1 5 ASSESSMENTS - Wound and Skin Assessment / Reassessment X - Simple Wound Assessment / Reassessment - one wound 1 5 []  - Complex Wound Assessment / Reassessment - multiple wounds 0 []  - Dermatologic / Skin Assessment (not related to wound area) 0 ASSESSMENTS - Focused Assessment []  - Circumferential Edema Measurements - multi extremities 0 []  - Nutritional Assessment / Counseling / Intervention 0 []  - Lower Extremity Assessment (monofilament, tuning fork, pulses) 0 []  - Peripheral Arterial Disease Assessment (using hand held doppler) 0 ASSESSMENTS - Ostomy and/or Continence Assessment and Care []  - Incontinence Assessment and Management 0 []  - Ostomy Care Assessment and Management (repouching, etc.) 0 PROCESS - Coordination of Care X - Simple Patient / Family Education for ongoing care 1 15 []  - Complex (extensive) Patient / Family Education for ongoing care 0 X - Staff obtains ChiropractorConsents, Records, Test Results / Process Orders 1 10 []  - Staff telephones HHA, Nursing Homes / Clarify orders / etc 0 []  - Routine Transfer to another Facility (non-emergent condition) 0 [name], [name] ([id number]) []  - Routine Hospital Admission (non-emergent condition) 0 []  - New Admissions / Manufacturing engineernsurance Authorizations / Ordering NPWT, Apligraf, etc. 0 []  - Emergency Hospital Admission (emergent condition) 0 []  - Simple Discharge Coordination 0 []  - Complex (extensive) Discharge Coordination 0 PROCESS - Special Needs []  - Pediatric / Minor Patient Management 0 []  - Isolation Patient Management 0 []  - Hearing / Language / Visual special needs 0 []  - Assessment of Community assistance (transportation, D/C planning, etc.) 0 []  - Additional assistance / Altered mentation 0 []  - Support Surface(s) Assessment  (bed, cushion, seat, etc.) 0 INTERVENTIONS - Wound Cleansing / Measurement X - Simple Wound Cleansing - one wound 1 5 []  - Complex Wound  Cleansing - multiple wounds 0 X - Wound Imaging (photographs - any number of wounds) 1 5 []  - Wound Tracing (instead of photographs) 0 X - Simple Wound Measurement - one wound 1 5 []  - Complex Wound Measurement - multiple wounds 0 INTERVENTIONS - Wound Dressings []  - Small Wound Dressing one or multiple wounds 0 X - Medium Wound Dressing one or multiple wounds 1 15 []  - Large Wound Dressing one or multiple wounds 0 []  - Application of Medications - topical 0 []  - Application of Medications - injection 0 INTERVENTIONS - Miscellaneous []  - External ear exam 0 [name], [name] ([id number]) []  - Specimen Collection (cultures, biopsies, blood, body fluids, etc.) 0 []  - Specimen(s) / Culture(s) sent or taken to Lab for analysis 0 []  - Patient Transfer (multiple staff / Nurse, adult / Similar devices) 0 []  - Simple Staple / Suture removal (25 or less) 0 []  - Complex Staple / Suture removal (26 or more) 0 []  - Hypo / Hyperglycemic Management (close monitor of Blood Glucose) 0 []  - Ankle / Brachial Index (ABI) - do not check if billed separately 0 X - Vital Signs 1 5 Has the patient been seen at the hospital within the last three years: Yes Total Score: 70 Level Of Care: New/Established - Level 2 Electronic Signature(s) Signed: 04/16/2015 5:40:40 PM By: Elliot Gurney, RN, BSN, Kim RN, BSN Entered By: Elliot Gurney, RN, BSN, Kim on 04/16/2015 13:27:05 [name] ([id number]) -------------------------------------------------------------------------------- Encounter Discharge Information Details Patient Name: Traci Iha A. Date of Service: 04/16/2015 1:00 PM Medical Record Number: [id number] Patient Account Number: [id number] Date of Birth/Sex: 1941/03/06 (74 y.o. Female) Treating RN: Huel Coventry Primary Care Physician: SYSTEM, PCP Other Clinician: Referring Physician:  Lorie Phenix Treating Physician/Extender: Rudene Re in Treatment: 4 Encounter Discharge Information Items Discharge Pain Level: 0 Discharge Condition: Stable Ambulatory Status: Wheelchair Discharge Destination: Home Private Transportation: Auto Accompanied By: self Schedule Follow-up Appointment: Yes Medication Reconciliation completed and Yes provided to Patient/Care [name]: Clinical Summary of Care: Electronic Signature(s) Signed: 04/16/2015 5:40:40 PM By: Elliot Gurney, RN, BSN, Kim RN, BSN Entered By: Elliot Gurney, RN, BSN, Kim on 04/16/2015 13:36:21 [name] ([id number]) -------------------------------------------------------------------------------- Lower Extremity Assessment Details Patient Name: Traci Iha A. Date of Service: 04/16/2015 1:00 PM Medical Record Number: [id number] Patient Account Number: [id number] Date of Birth/Sex: [birth year] (74 y.o. Female) Treating RN: Huel Coventry Primary Care Physician: SYSTEM, PCP Other Clinician: Referring Physician: Lorie Phenix Treating Physician/Extender: Rudene Re in Treatment: 4 Vascular Assessment Pulses: Posterior Tibial Dorsalis Pedis Palpable: [Left:Yes] Extremity colors, hair growth, and conditions: Extremity Color: [Left:Normal] Hair Growth on Extremity: [Left:No] Temperature of Extremity: [Left:Warm] Capillary Refill: [Left:< 3 seconds] Toe Nail Assessment Left: Right: Thick: No Discolored: No Deformed: No Improper Length and Hygiene: No Electronic Signature(s) Signed: 04/16/2015 5:40:40 PM By: Elliot Gurney, RN, BSN, Kim RN, BSN Entered By: Elliot Gurney, RN, BSN, Kim on 04/16/2015 13:18:23 Traci Iha A. ([id number]) -------------------------------------------------------------------------------- Multi Wound Chart Details Patient Name: Traci Iha A. Date of Service: 04/16/2015 1:00 PM Medical Record Number: [id number] Patient Account Number: [id number] Date of Birth/Sex: 03-18-1941 (73 y.o.  Female) Treating RN: Huel Coventry Primary Care Physician: SYSTEM, PCP Other Clinician: Referring Physician: Lorie Phenix Treating Physician/Extender: Rudene Re in Treatment: 4 Vital Signs Height(in): 62 Pulse(bpm): 64 Weight(lbs): 250 Blood Pressure 146/47 (mmHg): Body Mass Index(BMI): 46 Temperature(F): 98.4 Respiratory Rate 18 (breaths/min): Photos: [1:No Photos] [N/A:N/A] Wound Location: [1:Left Calcaneous] [N/A:N/A] Wounding Event: [1:Pressure Injury] [N/A:N/A] Primary Etiology: [1:Pressure Ulcer] [N/A:N/A] Comorbid History: [1:Anemia, Chronic Obstructive  Pulmonary Disease (COPD), Sleep Apnea, Arrhythmia, Congestive Heart Failure, Coronary Artery Disease, Hypertension, Peripheral Venous Disease, Type II Diabetes, Osteoarthritis, Neuropathy] [N/A:N/A] Date Acquired: [1:02/12/2015] [N/A:N/A] Weeks of Treatment: [1:4] [N/A:N/A] Wound Status: [1:Open] [N/A:N/A] Measurements L x W x D 1.5x2.2x0.1 [N/A:N/A] (cm) Area (cm) : [1:2.592] [N/A:N/A] Volume (cm) : [1:0.259] [N/A:N/A] % Reduction in Area: [1:63.70%] [N/A:N/A] % Reduction in Volume: 63.80% [N/A:N/A] Classification: [1:Category/Stage III] [N/A:N/A] HBO Classification: [1:Grade 2] [N/A:N/A] Exudate Amount: [1:Large] [N/A:N/A] Exudate Type: [1:Serous] [N/A:N/A] Exudate Color: [1:amber] [N/A:N/A] Foul Odor After [1:Yes] [N/A:N/A] Cleansing: Scow, Angelika A. ([id number]) Odor Anticipated Due to No N/A N/A Product Use: Wound Margin: Flat and Intact N/A N/A Granulation Amount: Medium (34-66%) N/A N/A Granulation Quality: Pink N/A N/A Necrotic Amount: Medium (34-66%) N/A N/A Necrotic Tissue: Eschar N/A N/A Exposed Structures: Fascia: No N/A N/A Fat: No Tendon: No Muscle: No Joint: No Bone: No Limited to Skin Breakdown Epithelialization: Small (1-33%) N/A N/A Periwound Skin Texture: Edema: No N/A N/A Excoriation: No Induration: No Callus: No Crepitus: No Fluctuance: No Friable: No Rash:  No Scarring: No Periwound Skin Moist: Yes N/A N/A Moisture: Maceration: No Dry/Scaly: No Periwound Skin Color: Atrophie Blanche: No N/A N/A Cyanosis: No Ecchymosis: No Erythema: No Hemosiderin Staining: No Mottled: No Pallor: No Rubor: No Temperature: No Abnormality N/A N/A Tenderness on No N/A N/A Palpation: Wound Preparation: Ulcer Cleansing: N/A N/A Rinsed/Irrigated with Saline Topical Anesthetic Applied: Other: lidocaine 4% Treatment Notes CUBA, NATARAJAN ([id number]) Electronic Signature(s) Signed: 04/16/2015 5:40:40 PM By: Elliot Gurney, RN, BSN, Kim RN, BSN Entered By: Elliot Gurney, RN, BSN, Kim on 04/16/2015 13:25:04 [name] ([id number]) -------------------------------------------------------------------------------- Multi-Disciplinary Care Plan Details Patient Name: Traci Iha A. Date of Service: 04/16/2015 1:00 PM Medical Record Number: [id number] Patient Account Number: [id number] Date of Birth/Sex: 10-11-1940 (74 y.o. Female) Treating RN: Huel Coventry Primary Care Physician: SYSTEM, PCP Other Clinician: Referring Physician: Lorie Phenix Treating Physician/Extender: Rudene Re in Treatment: 4 Active Inactive Abuse / Safety / Falls / Self Care Management Nursing Diagnoses: Potential for falls Goals: Patient will remain injury free Date Initiated: 03/16/2015 Goal Status: Active Interventions: Assess fall risk on admission and as needed Notes: Orientation to the Wound Care Program Nursing Diagnoses: Knowledge deficit related to the wound healing center program Goals: Patient/caregiver will verbalize understanding of the Wound Healing Center Program Date Initiated: 03/16/2015 Goal Status: Active Interventions: Provide education on orientation to the wound center Notes: Pressure Nursing Diagnoses: Potential for impaired tissue integrity related to pressure, friction, moisture, and shear Goals: Patient will remain free from development of  additional pressure ulcers Date Initiated: 03/16/2015 [name] ([id number]) Goal Status: Active Interventions: Assess potential for pressure ulcer upon admission and as needed Notes: Wound/Skin Impairment Nursing Diagnoses: Impaired tissue integrity Goals: Ulcer/skin breakdown will have a volume reduction of 30% by week 4 Date Initiated: 03/16/2015 Goal Status: Active Interventions: Assess patient/caregiver ability to perform ulcer/skin care regimen upon admission and as needed Notes: Electronic Signature(s) Signed: 04/16/2015 5:40:40 PM By: Elliot Gurney, RN, BSN, Kim RN, BSN Entered By: Elliot Gurney, RN, BSN, Kim on 04/16/2015 13:24:42 Traci Iha A. ([id number]) -------------------------------------------------------------------------------- Pain Assessment Details Patient Name: Traci Iha A. Date of Service: 04/16/2015 1:00 PM Medical Record Number: [id number] Patient Account Number: [id number] Date of Birth/Sex: 26-Aug-1941 (74 y.o. Female) Treating RN: Huel Coventry Primary Care Physician: SYSTEM, PCP Other Clinician: Referring Physician: Lorie Phenix Treating Physician/Extender: Rudene Re in Treatment: 4 Active Problems Location of Pain Severity and Description of Pain Patient Has Paino No Site Locations Pain Management and  Medication Current Pain Management: Electronic Signature(s) Signed: 04/16/2015 5:40:40 PM By: Elliot Gurney, RN, BSN, Kim RN, BSN Entered By: Elliot Gurney, RN, BSN, Kim on 04/16/2015 13:17:02 [name] ([id number]) -------------------------------------------------------------------------------- Patient/Caregiver Education Details Patient Name: Traci Iha A. Date of Service: 04/16/2015 1:00 PM Medical Record Number: [id number] Patient Account Number: [id number] Date of Birth/Gender: July 11, 1941 (74 y.o. Female) Treating RN: Huel Coventry Primary Care Physician: SYSTEM, PCP Other Clinician: Referring Physician: Lorie Phenix Treating Physician/Extender:  Rudene Re in Treatment: 4 Education Assessment Education Provided To: Patient Education Topics Provided Wound/Skin Impairment: Handouts: Caring for Your Ulcer, Other: Sent orders to SNF with patient Electronic Signature(s) Signed: 04/16/2015 5:40:40 PM By: Elliot Gurney, RN, BSN, Kim RN, BSN Entered By: Elliot Gurney, RN, BSN, Kim on 04/16/2015 13:36:45 [name] ([id number]) -------------------------------------------------------------------------------- Wound Assessment Details Patient Name: Traci Iha A. Date of Service: 04/16/2015 1:00 PM Medical Record Number: [id number] Patient Account Number: [id number] Date of Birth/Sex: Jan 26, 1941 (74 y.o. Female) Treating RN: Huel Coventry Primary Care Physician: SYSTEM, PCP Other Clinician: Referring Physician: Lorie Phenix Treating Physician/Extender: Rudene Re in Treatment: 4 Wound Status Wound Number: 1 Primary Pressure Ulcer Etiology: Wound Location: Left Calcaneous Wound Open Wounding Event: Pressure Injury Status: Date Acquired: 02/12/2015 Comorbid Anemia, Chronic Obstructive Pulmonary Weeks Of Treatment: 4 History: Disease (COPD), Sleep Apnea, Clustered Wound: No Arrhythmia, Congestive Heart Failure, Coronary Artery Disease, Hypertension, Peripheral Venous Disease, Type II Diabetes, Osteoarthritis, Neuropathy Photos Photo Uploaded By: Elliot Gurney, RN, BSN, Kim on 04/16/2015 17:11:40 Wound Measurements Length: (cm) 1.5 Width: (cm) 2.2 Depth: (cm) 0.1 Area: (cm) 2.592 Volume: (cm) 0.259 % Reduction in Area: 63.7% % Reduction in Volume: 63.8% Epithelialization: Small (1-33%) Wound Description Classification: Category/Stage III Diabetic Severity [address]): Grade 2 Wound Margin: Flat and Intact Exudate Amount: Large Exudate Type: Serous Exudate Color: amber Foul Odor After Cleansing: Yes Due to Product Use: No Wound Bed Vandevender, [name] ([id number]) Granulation Amount: Medium (34-66%) Exposed  Structure Granulation Quality: Pink Fascia Exposed: No Necrotic Amount: Medium (34-66%) Fat Layer Exposed: No Necrotic Quality: Eschar Tendon Exposed: No Muscle Exposed: No Joint Exposed: No Bone Exposed: No Limited to Skin Breakdown Periwound Skin Texture Texture Color No Abnormalities Noted: No No Abnormalities Noted: No Callus: No Atrophie Blanche: No Crepitus: No Cyanosis: No Excoriation: No Ecchymosis: No Fluctuance: No Erythema: No Friable: No Hemosiderin Staining: No Induration: No Mottled: No Localized Edema: No Pallor: No Rash: No Rubor: No Scarring: No Temperature / Pain Moisture Temperature: No Abnormality No Abnormalities Noted: No Dry / Scaly: No Maceration: No Moist: Yes Wound Preparation Ulcer Cleansing: Rinsed/Irrigated with Saline Topical Anesthetic Applied: Other: lidocaine 4%, Treatment Notes Wound #1 (Left Calcaneous) 1. Cleansed with: Clean wound with Normal Saline 2. Anesthetic Topical Lidocaine 4% cream to wound bed prior to debridement 3. Peri-wound Care: Barrier cream 4. Dressing Applied: Prisma Ag 5. Secondary Dressing Applied ABD and Kerlix/Conform 7. Secured with Paper tape Notes [name], [name] ([id number]) stretch netting Electronic Signature(s) Signed: 04/16/2015 5:40:40 PM By: Elliot Gurney, RN, BSN, Kim RN, BSN Entered By: Elliot Gurney, RN, BSN, Kim on 04/16/2015 13:19:31 [name] ([id number]) -------------------------------------------------------------------------------- Vitals Details Patient Name: Traci Iha A. Date of Service: 04/16/2015 1:00 PM Medical Record Number: [id number] Patient Account Number: [id number] Date of Birth/Sex: Oct 10, 1940 (74 y.o. Female) Treating RN: Huel Coventry Primary Care Physician: SYSTEM, PCP Other Clinician: Referring Physician: Lorie Phenix Treating Physician/Extender: Rudene Re in Treatment: 4 Vital Signs Time Taken: 13:17 Temperature (F): 98.4 Height (in): 62 Pulse  (bpm): 64 Weight (lbs): 250 Respiratory Rate (breaths/min): 18  Body Mass Index (BMI): 45.7 Blood Pressure (mmHg): 146/47 Reference Range: 80 - 120 mg / dl Electronic Signature(s) Signed: 04/16/2015 5:40:40 PM By: Elliot Gurney, RN, BSN, Kim RN, BSN Entered By: Elliot Gurney, RN, BSN, Kim on 04/16/2015 13:17:26

## 2015-04-23 ENCOUNTER — Encounter: Payer: Medicare Other | Admitting: Surgery

## 2015-04-23 DIAGNOSIS — L89623 Pressure ulcer of left heel, stage 3: Secondary | ICD-10-CM | POA: Diagnosis not present

## 2015-04-23 NOTE — Progress Notes (Addendum)
HENNESSY, BARTEL ([id number]) Visit Report for 04/23/2015 Chief Complaint Document Details Patient Name: [name], [name] Date of Service: 04/23/2015 1:45 PM Medical Record Number: [id number] Patient Account Number: [id number] Date of Birth/Sex: July 06, 1941 (73 y.o. Female) Treating RN: Primary Care Physician: SYSTEM, PCP Other Clinician: Referring Physician: Lorie Phenix Treating Physician/Extender: Rudene Re in Treatment: 5 Information Obtained from: Patient Chief Complaint Patient presents to the wound care center for a consult due non healing wound. 74 year old patient who comes with the chief complaints of having a ulcerated area on her left heel for about a month. Electronic Signature(s) Signed: 04/23/2015 2:30:37 PM By: Evlyn Kanner MD, FACS Entered By: Evlyn Kanner on 04/23/2015 14:30:36 [name] ([id number]) -------------------------------------------------------------------------------- HPI Details Patient Name: Traci Iha A. Date of Service: 04/23/2015 1:45 PM Medical Record Number: [id number] Patient Account Number: [id number] Date of Birth/Sex: 05/28/1941 (73 y.o. Female) Treating RN: Primary Care Physician: SYSTEM, PCP Other Clinician: Referring Physician: Lorie Phenix Treating Physician/Extender: Rudene Re in Treatment: 5 History of Present Illness Location: bilateral pressure injuries to both heels, left more than right Quality: Patient reports No Pain. Severity: Patient states wound are getting worse. Duration: Patient has had the wound for > 1 months prior to seeking treatment at the wound center Context: The wound appeared gradually over time Modifying Factors: Consults to this date include:some local care as per the nursing home staff Associated Signs and Symptoms: Patient reports having:no sensation on her feet whatsoever HPI Description: 75 year old patient who is known to have several comorbidities including  Diabetes mellitus, opoid dependence, cervical spondylosis, GERD, anemia, dermatitis, general debility and pressure ulcers of both heels is here for an opinion regarding the latter. She does not check her blood sugar every day and does not know the last blood values which were done about 2 weeks ago. she has been a smoker all her life and was smoking up to a pack and a half a day but now smokes 1 packet in 3 days. PAST MEDICAL HISTORY: DM type 2, COPD, OSA, hypothyroidism, hypertension, diabetes, hyperlipidemia, CHF, morbid obesity, previous history of tobacco abuse, coronary artery disease, paroxysmal atrial fibrillation on Xarelto. 04/23/2015 -- he has been off loading well but still continues to smoke about 6 cigarettes a day. No other fresh issues. Electronic Signature(s) Signed: 04/23/2015 2:31:06 PM By: Evlyn Kanner MD, FACS Entered By: Evlyn Kanner on 04/23/2015 14:31:06 [name] ([id number]) -------------------------------------------------------------------------------- Physical Exam Details Patient Name: Traci Iha A. Date of Service: 04/23/2015 1:45 PM Medical Record Number: [id number] Patient Account Number: [id number] Date of Birth/Sex: 10/28/1940 (73 y.o. Female) Treating RN: Primary Care Physician: SYSTEM, PCP Other Clinician: Referring Physician: Lorie Phenix Treating Physician/Extender: Rudene Re in Treatment: 5 Constitutional . Pulse regular. Respirations normal and unlabored. Afebrile. . Eyes Nonicteric. Reactive to light. Ears, Nose, Mouth, and Throat Lips, teeth, and gums WNL.Marland Kitchen Moist mucosa without lesions . Neck supple and nontender. No palpable supraclavicular or cervical adenopathy. Normal sized without goiter. Respiratory WNL. No retractions.. Cardiovascular Pedal Pulses WNL. No clubbing, cyanosis or edema. Chest Breasts symmetical and no nipple discharge.. Breast tissue WNL, no masses, lumps, or tenderness.. Musculoskeletal Adexa  without tenderness or enlargement.. Digits and nails w/o clubbing, cyanosis, infection, petechiae, ischemia, or inflammatory conditions.. Integumentary (Hair, Skin) No suspicious lesions. No crepitus or fluctuance. No peri-wound warmth or erythema. No masses.Marland Kitchen Psychiatric Judgement and insight Intact.. No evidence of depression, anxiety, or agitation.. Notes The wound looks much better overall and there is healthy granulation tissue Electronic Signature(s) Signed:  04/23/2015 2:31:41 PM By: Evlyn Kanner MD, FACS Entered By: Evlyn Kanner on 04/23/2015 14:31:41 [name] ([id number]) -------------------------------------------------------------------------------- Physician Orders Details Patient Name: Traci Iha A. Date of Service: 04/23/2015 1:45 PM Medical Record Number: [id number] Patient Account Number: [id number] Date of Birth/Sex: 09-Oct-1940 (73 y.o. Female) Treating RN: Curtis Sites Primary Care Physician: SYSTEM, PCP Other Clinician: Referring Physician: Lorie Phenix Treating Physician/Extender: Rudene Re in Treatment: 5 Verbal / Phone Orders: Yes Clinician: Curtis Sites Read Back and Verified: Yes Diagnosis Coding Wound Cleansing Wound #1 Left Calcaneous o Clean wound with Normal Saline. o May shower with protection. - may shower with dressing on and change dressing after shower Anesthetic Wound #1 Left Calcaneous o Topical Lidocaine 4% cream applied to wound bed prior to debridement Skin Barriers/Peri-Wound Care Wound #1 Left Calcaneous o Barrier cream - on reddened irritated skin Primary Wound Dressing Wound #1 Left Calcaneous o Prisma Ag Secondary Dressing Wound #1 Left Calcaneous o Gauze, ABD and Kerlix/Conform - and stretch netting as needed to keep dressing intact - DO NOT PUT ANY ADHESIVE ON PATIENT'S SKIN Dressing Change Frequency Wound #1 Left Calcaneous o Change dressing every other day. Follow-up Appointments Wound  #1 Left Calcaneous o Return Appointment in 1 week. Off-Loading Wound #1 Left Calcaneous o Heel suspension boot to: - sage boots DANIJELA, VESSEY A. ([id number]) Electronic Signature(s) Signed: 04/23/2015 3:52:03 PM By: Evlyn Kanner MD, FACS Signed: 04/23/2015 4:45:08 PM By: Curtis Sites Entered By: Curtis Sites on 04/23/2015 14:30:59 [name] ([id number]) -------------------------------------------------------------------------------- Problem List Details Patient Name: Traci Iha A. Date of Service: 04/23/2015 1:45 PM Medical Record Number: [id number] Patient Account Number: [id number] Date of Birth/Sex: 1941-09-09 (73 y.o. 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 Date of Service: 04/23/2015 1:45 PM Medical Record Number: [id number] Patient Account Number: [id number] Date of Birth/Sex: Feb 21, 1941 (73 y.o. Female) Treating RN: Primary Care Physician: SYSTEM, PCP Other Clinician: Referring Physician: Lorie Phenix Treating Physician/Extender: Rudene Re  in Treatment: 5 Subjective Chief Complaint Information obtained from Patient Patient presents to the wound care center for a consult due non healing wound. 74 year old patient who comes with the chief complaints of having a ulcerated area on her left heel for about a month. History of Present Illness (HPI) The following HPI elements were documented for the patient's wound: Location: bilateral pressure injuries to both heels, left more than right Quality: Patient reports No Pain. Severity: Patient states wound are getting worse. Duration: Patient has had the wound for > 1 months prior to seeking treatment at the wound center Context: The wound appeared gradually over time Modifying Factors: Consults to this date include:some local care as per the nursing home staff Associated Signs and Symptoms: Patient reports having:no sensation on her feet whatsoever 74 year old patient who is known to have several comorbidities including Diabetes mellitus, opoid dependence, cervical spondylosis, GERD, anemia,  dermatitis, general debility and pressure ulcers of both heels is here for an opinion regarding the latter. She does not check her blood sugar every day and does not know the last blood values which were done about 2 weeks ago. she has been a smoker all her life and was smoking up to a pack and a half a day but now smokes 1 packet in 3 days. PAST MEDICAL HISTORY: DM type 2, COPD, OSA, hypothyroidism, hypertension, diabetes, hyperlipidemia, CHF, morbid obesity, previous history of tobacco abuse, coronary artery disease, paroxysmal atrial fibrillation on Xarelto. 04/23/2015 -- he has been off loading well but still continues to smoke about 6 cigarettes a day. No other fresh issues. Objective Constitutional Furney, Shelie A. ([id number]) Pulse regular. Respirations normal and unlabored. Afebrile. Vitals Time Taken: 2:17 PM, Height: 62 in, Weight: 250 lbs, BMI: 45.7, Temperature: 98.1 F, Pulse:  63 bpm, Respiratory Rate: 18 breaths/min, Blood Pressure: 130/46 mmHg. Eyes Nonicteric. Reactive to light. Ears, Nose, Mouth, and Throat Lips, teeth, and gums WNL.Marland Kitchen. Moist mucosa without lesions . Neck supple and nontender. No palpable supraclavicular or cervical adenopathy. Normal sized without goiter. Respiratory WNL. No retractions.. Cardiovascular Pedal Pulses WNL. No clubbing, cyanosis or edema. Chest Breasts symmetical and no nipple discharge.. Breast tissue WNL, no masses, lumps, or tenderness.. Musculoskeletal Adexa without tenderness or enlargement.. Digits and nails w/o clubbing, cyanosis, infection, petechiae, ischemia, or inflammatory conditions.Marland Kitchen. Psychiatric Judgement and insight Intact.. No evidence of depression, anxiety, or agitation.. General Notes: The wound looks much better overall and there is healthy granulation tissue Integumentary (Hair, Skin) No suspicious lesions. No crepitus or fluctuance. No peri-wound warmth or erythema. No masses.. Wound #1 status is Open. Original cause of wound was Pressure Injury. The wound is located on the Left Calcaneous. The wound measures 1cm length x 1.9cm width x 0.1cm depth; 1.492cm^2 area and 0.149cm^3 volume. The wound is limited to skin breakdown. There is no tunneling or undermining noted. There is a large amount of serous drainage noted. The wound margin is flat and intact. There is large (67- 100%) pink granulation within the wound bed. There is a small (1-33%) amount of necrotic tissue within the wound bed including Eschar. The periwound skin appearance exhibited: Moist. The periwound skin appearance did not exhibit: Callus, Crepitus, Excoriation, Fluctuance, Friable, Induration, Localized Edema, Rash, Scarring, Dry/Scaly, Maceration, Atrophie Blanche, Cyanosis, Ecchymosis, Hemosiderin Staining, Mottled, Pallor, Rubor, Erythema. Periwound temperature was noted as No Abnormality. Assessment [name], Blandina A.  ([id number]) Active Problems ICD-10 E11.621 - Type 2 diabetes mellitus with foot ulcer E08.42 - Diabetes mellitus due to underlying condition with diabetic polyneuropathy L89.623 - Pressure ulcer of left heel, stage 3 E66.01 - Morbid (severe) obesity due to excess calories F17.218 - Nicotine dependence, cigarettes, with other nicotine-induced disorders We will continue with Prisma [employer] Anna bordered foam dressing and with care not to cause any further abrasions on the surrounding ankle. I have urged her to give up smoking completely. She will come back and see me next week. Plan Wound Cleansing: Wound #1 Left Calcaneous: Clean wound with Normal Saline. May shower with protection. - may shower with dressing on and change dressing after shower Anesthetic: Wound #1 Left Calcaneous: Topical Lidocaine 4% cream applied to wound bed prior to debridement Skin Barriers/Peri-Wound Care: Wound #1 Left Calcaneous: Barrier cream - on reddened irritated skin Primary Wound Dressing: Wound #1 Left Calcaneous: Prisma Ag Secondary Dressing: Wound #1 Left Calcaneous: Gauze, ABD and Kerlix/Conform - and stretch netting as needed to keep dressing intact -  DO NOT PUT ANY ADHESIVE ON PATIENT'S SKIN Dressing Change Frequency: Wound #1 Left Calcaneous: Change dressing every other day. Follow-up Appointments: Wound #1 Left Calcaneous: Return Appointment in 1 week. Off-Loading: Wound #1 Left Calcaneous: DENAISHA, SWANGO A. ([id number]) Heel suspension boot to: - sage boots We will continue with Prisma AG Anna bordered foam dressing and with care not to cause any further abrasions on the surrounding ankle. I have urged her to give up smoking completely. She will come back and see me next week. Electronic Signature(s) Signed: 04/23/2015 2:32:38 PM By: Evlyn Kanner MD, FACS Entered By: Evlyn Kanner on 04/23/2015 14:32:37 [name]  ([id number]) -------------------------------------------------------------------------------- SuperBill Details Patient Name: Traci Iha A. Date of Service: 04/23/2015 Medical Record Number: [id number] Patient Account Number: [id number] Date of Birth/Sex: 06/06/1941 (74 y.o. 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 L89.623 Pressure ulcer of left heel, stage 3 E66.01 Morbid (severe) obesity due to excess calories F17.218 Nicotine dependence, cigarettes, with other nicotine-induced disorders Facility Procedures CPT4 Code: 08657846 Description: 99213 - WOUND CARE VISIT-LEV 3 EST PT Modifier: Quantity: 1 Physician Procedures CPT4 Code Description: [id number] 99213 - WC PHYS LEVEL 3 - EST PT ICD-10 Description Diagnosis E11.621 Type 2 diabetes mellitus with foot ulcer E08.42 Diabetes mellitus due to underlying condition with L89.623 Pressure ulcer of left heel, stage 3 Modifier: diabetic poly Quantity: 1 neuropathy Electronic Signature(s) Signed: 04/23/2015 2:32:59 PM By: Evlyn Kanner MD, FACS Entered By: Evlyn Kanner on 04/23/2015 14:32:59

## 2015-04-24 NOTE — Progress Notes (Signed)
[name], [name] ([id number]) Visit Report for 04/23/2015 Arrival Information Details Patient Name: [name], [name] Date of Service: 04/23/2015 1:45 PM Medical Record Number: [id number] Patient Account Number: [id number] Date of Birth/Sex: 1941-03-30 (73 y.o. Female) Treating RN: [name] Primary Care Physician: SYSTEM, PCP Other Clinician: Referring Physician: Lorie [name] Treating Physician/Extender: [name] in Treatment: 5 Visit Information History Since Last Visit Added or deleted any medications: No Patient Arrived: Wheel Chair Any new allergies or adverse reactions: No Arrival Time: 14:13 Had a fall or experienced change in No Accompanied By: self activities of daily living that may affect Transfer Assistance: None risk of falls: Patient Identification Verified: Yes Signs or symptoms of abuse/neglect since last No Secondary Verification Process Yes visito Completed: Hospitalized since last visit: No Patient Has Alerts: Yes Pain Present Now: No Patient Alerts: Patient on Blood Thinner DMII xarelto, asa Electronic Signature(s) Signed: 04/23/2015 4:45:08 PM By: [name] Entered By: [name] on 04/23/2015 14:16:50 [name] ([id number]) -------------------------------------------------------------------------------- Clinic Level of Care Assessment Details Patient Name: [name] A. Date of Service: 04/23/2015 1:45 PM Medical Record Number: [id number] Patient Account Number: [id number] Date of Birth/Sex: 1940/11/30 (73 y.o. Female) Treating RN: [name] Primary Care Physician: SYSTEM, PCP Other Clinician: Referring Physician: Lorie [name] Treating Physician/Extender: [name] in Treatment: 5 Clinic Level of Care Assessment Items TOOL 4 Quantity Score []  - Use when only an EandM is performed on FOLLOW-UP visit 0 ASSESSMENTS - Nursing Assessment / Reassessment X - Reassessment of Co-morbidities (includes  updates in patient status) 1 10 X - Reassessment of Adherence to Treatment Plan 1 5 ASSESSMENTS - Wound and Skin Assessment / Reassessment X - Simple Wound Assessment / Reassessment - one wound 1 5 []  - Complex Wound Assessment / Reassessment - multiple wounds 0 []  - Dermatologic / Skin Assessment (not related to wound area) 0 ASSESSMENTS - Focused Assessment []  - Circumferential Edema Measurements - multi extremities 0 []  - Nutritional Assessment / Counseling / Intervention 0 X - Lower Extremity Assessment (monofilament, tuning fork, pulses) 1 5 []  - Peripheral Arterial Disease Assessment (using hand held doppler) 0 ASSESSMENTS - Ostomy and/or Continence Assessment and Care []  - Incontinence Assessment and Management 0 []  - Ostomy Care Assessment and Management (repouching, etc.) 0 PROCESS - Coordination of Care X - Simple Patient / Family Education for ongoing care 1 15 []  - Complex (extensive) Patient / Family Education for ongoing care 0 []  - Staff obtains Chiropractor, Records, Test Results / Process Orders 0 []  - Staff telephones HHA, Nursing Homes / Clarify orders / etc 0 []  - Routine Transfer to another Facility (non-emergent condition) 0 [name], [name] ([id number]) []  - Routine Hospital Admission (non-emergent condition) 0 []  - New Admissions / Manufacturing engineer / Ordering NPWT, Apligraf, etc. 0 []  - Emergency Hospital Admission (emergent condition) 0 X - Simple Discharge Coordination 1 10 []  - Complex (extensive) Discharge Coordination 0 PROCESS - Special Needs []  - Pediatric / Minor Patient Management 0 []  - Isolation Patient Management 0 []  - Hearing / Language / Visual special needs 0 []  - Assessment of Community assistance (transportation, D/C planning, etc.) 0 []  - Additional assistance / Altered mentation 0 []  - Support Surface(s) Assessment (bed, cushion, seat, etc.) 0 INTERVENTIONS - Wound Cleansing / Measurement X - Simple Wound Cleansing - one wound 1 5 []  -  Complex Wound Cleansing - multiple wounds 0 X - Wound Imaging (photographs - any number of wounds) 1 5 []  - Wound Tracing (  instead of photographs) 0 X - Simple Wound Measurement - one wound 1 5 []  - Complex Wound Measurement - multiple wounds 0 INTERVENTIONS - Wound Dressings X - Small Wound Dressing one or multiple wounds 1 10 []  - Medium Wound Dressing one or multiple wounds 0 []  - Large Wound Dressing one or multiple wounds 0 []  - Application of Medications - topical 0 []  - Application of Medications - injection 0 INTERVENTIONS - Miscellaneous []  - External ear exam 0 Traci, [name] ([id number]) []  - Specimen Collection (cultures, biopsies, blood, body fluids, etc.) 0 []  - Specimen(s) / Culture(s) sent or taken to Lab for analysis 0 []  - Patient Transfer (multiple staff / Michiel SitesHoyer Lift / Similar devices) 0 []  - Simple Staple / Suture removal (25 or less) 0 []  - Complex Staple / Suture removal (26 or more) 0 []  - Hypo / Hyperglycemic Management (close monitor of Blood Glucose) 0 []  - Ankle / Brachial Index (ABI) - do not check if billed separately 0 X - Vital Signs 1 5 Has the patient been seen at the hospital within the last three years: Yes Total Score: 80 Level Of Care: New/Established - Level 3 Electronic Signature(s) Signed: 04/23/2015 4:45:08 PM By: Traci Sitesorthy, Joanna Entered By: Traci Sitesorthy, Joanna on 04/23/2015 14:31:46 Traci GiovanniGROCE, [name] ([id number]) -------------------------------------------------------------------------------- Encounter Discharge Information Details Patient Name: [name], [name] Date of Service: 04/23/2015 1:45 PM Medical Record Number: [id number] Patient Account Number: [id number] Date of Birth/Sex: 04/16/1941 (73 y.o. Female) Treating RN: Traci Sitesorthy, Joanna Primary Care Physician: SYSTEM, PCP Other Clinician: Referring Physician: Lorie PhenixMaloney, Nancy Treating Physician/Extender: Traci ReBritto, [name] in Treatment: 5 Encounter Discharge Information Items Discharge Pain  Level: 0 Discharge Condition: Stable Ambulatory Status: Wheelchair Nursing Discharge Destination: Home Transportation: Private Auto Accompanied By: self Schedule Follow-up Appointment: Yes Medication Reconciliation completed No and provided to Patient/Care Chadd Tollison: Clinical Summary of Care: Electronic Signature(s) Signed: 04/23/2015 4:45:08 PM By: Traci Sitesorthy, Joanna Entered By: Traci Sitesorthy, Joanna on 04/23/2015 14:45:03 Traci GiovanniGROCE, [name] ([id number]) -------------------------------------------------------------------------------- Lower Extremity Assessment Details Patient Name: [name], [name] Date of Service: 04/23/2015 1:45 PM Medical Record Number: [id number] Patient Account Number: [id number] Date of Birth/Sex: 08/24/1941 (73 y.o. Female) Treating RN: Traci Sitesorthy, Joanna Primary Care Physician: SYSTEM, PCP Other Clinician: Referring Physician: Lorie PhenixMaloney, Nancy Treating Physician/Extender: Traci ReBritto, [name] in Treatment: 5 Vascular Assessment Pulses: Posterior Tibial Dorsalis Pedis Palpable: [Left:Yes] Extremity colors, hair growth, and conditions: Extremity Color: [Left:Normal] Hair Growth on Extremity: [Left:No] Temperature of Extremity: [Left:Warm] Capillary Refill: [Left:< 3 seconds] Toe Nail Assessment Left: Right: Thick: Yes Discolored: No Deformed: No Improper Length and Hygiene: Yes Electronic Signature(s) Signed: 04/23/2015 4:45:08 PM By: Traci Sitesorthy, Joanna Entered By: Traci Sitesorthy, Joanna on 04/23/2015 14:17:26 [name], Ariyana A. ([id number]) -------------------------------------------------------------------------------- Multi Wound Chart Details Patient Name: [name], [name] Date of Service: 04/23/2015 1:45 PM Medical Record Number: [id number] Patient Account Number: [id number] Date of Birth/Sex: 07/23/1941 (73 y.o. Female) Treating RN: Traci Sitesorthy, Joanna Primary Care Physician: SYSTEM, PCP Other Clinician: Referring Physician: Lorie PhenixMaloney, Nancy Treating Physician/Extender: Traci ReBritto,  [name] in Treatment: 5 Vital Signs Height(in): 62 Pulse(bpm): 63 Weight(lbs): 250 Blood Pressure 130/46 (mmHg): Body Mass Index(BMI): 46 Temperature(F): 98.1 Respiratory Rate 18 (breaths/min): Photos: [1:No Photos] [N/A:N/A] Wound Location: [1:Left Calcaneous] [N/A:N/A] Wounding Event: [1:Pressure Injury] [N/A:N/A] Primary Etiology: [1:Pressure Ulcer] [N/A:N/A] Comorbid History: [1:Anemia, Chronic Obstructive Pulmonary Disease (COPD), Sleep Apnea, Arrhythmia, Congestive Heart Failure, Coronary Artery Disease, Hypertension, Peripheral Venous Disease, Type II Diabetes, Osteoarthritis, Neuropathy] [N/A:N/A] Date Acquired: [1:02/12/2015] [N/A:N/A] [name] of Treatment: [1:5] [N/A:N/A] Wound Status: [1:Open] [N/A:N/A] Measurements L x  W x D 1x1.9x0.1 [N/A:N/A] (cm) Area (cm) : [1:1.492] [N/A:N/A] Volume (cm) : [1:0.149] [N/A:N/A] % Reduction in Area: [1:79.10%] [N/A:N/A] % Reduction in Volume: 79.20% [N/A:N/A] Classification: [1:Category/Stage III] [N/A:N/A] HBO Classification: [1:Grade 2] [N/A:N/A] Exudate Amount: [1:Large] [N/A:N/A] Exudate Type: [1:Serous] [N/A:N/A] Exudate Color: [1:amber] [N/A:N/A] Foul Odor After [1:Yes] [N/A:N/A] Cleansing: Monnin, [name] ([id number]) Odor Anticipated Due to No N/A N/A Product Use: Wound Margin: Flat and Intact N/A N/A Granulation Amount: Large (67-100%) N/A N/A Granulation Quality: Pink N/A N/A Necrotic Amount: Small (1-33%) N/A N/A Necrotic Tissue: Eschar N/A N/A Exposed Structures: Fascia: No N/A N/A Fat: No Tendon: No Muscle: No Joint: No Bone: No Limited to Skin Breakdown Epithelialization: Small (1-33%) N/A N/A Periwound Skin Texture: Edema: No N/A N/A Excoriation: No Induration: No Callus: No Crepitus: No Fluctuance: No Friable: No Rash: No Scarring: No Periwound Skin Moist: Yes N/A N/A Moisture: Maceration: No Dry/Scaly: No Periwound Skin Color: Atrophie Blanche: No N/A N/A Cyanosis: No Ecchymosis:  No Erythema: No Hemosiderin Staining: No Mottled: No Pallor: No Rubor: No Temperature: No Abnormality N/A N/A Tenderness on No N/A N/A Palpation: Wound Preparation: Ulcer Cleansing: N/A N/A Rinsed/Irrigated with Saline Topical Anesthetic Applied: Other: lidocaine 4% Treatment Notes [name], [name] ([id number]) Electronic Signature(s) Signed: 04/23/2015 4:45:08 PM By: [name] Entered By: [name] on 04/23/2015 14:25:03 [name] ([id number]) -------------------------------------------------------------------------------- Multi-Disciplinary Care Plan Details Patient Name: [name] A. Date of Service: 04/23/2015 1:45 PM Medical Record Number: [id number] Patient Account Number: [id number] Date of Birth/Sex: November 21, 1940 (73 y.o. Female) Treating RN: [name] Primary Care Physician: SYSTEM, PCP Other Clinician: Referring Physician: Lorie [name] Treating Physician/Extender: [name] in Treatment: 5 Active Inactive Abuse / Safety / Falls / Self Care Management Nursing Diagnoses: Potential for falls Goals: Patient will remain injury free Date Initiated: 03/16/2015 Goal Status: Active Interventions: Assess fall risk on admission and as needed Notes: Orientation to the Wound Care Program Nursing Diagnoses: Knowledge deficit related to the wound healing center program Goals: Patient/caregiver will verbalize understanding of the Wound Healing Center Program Date Initiated: 03/16/2015 Goal Status: Active Interventions: Provide education on orientation to the wound center Notes: Pressure Nursing Diagnoses: Potential for impaired tissue integrity related to pressure, friction, moisture, and shear Goals: Patient will remain free from development of additional pressure ulcers Date Initiated: 03/16/2015 [name] ([id number]) Goal Status: Active Interventions: Assess potential for pressure ulcer upon admission and as  needed Notes: Wound/Skin Impairment Nursing Diagnoses: Impaired tissue integrity Goals: Ulcer/skin breakdown will have a volume reduction of 30% by week 4 Date Initiated: 03/16/2015 Goal Status: Active Interventions: Assess patient/caregiver ability to perform ulcer/skin care regimen upon admission and as needed Notes: Electronic Signature(s) Signed: 04/23/2015 4:45:08 PM By: [name] Entered By: [name] on 04/23/2015 14:24:53 [name] ([id number]) -------------------------------------------------------------------------------- Patient/Caregiver Education Details Patient Name: [name] A. Date of Service: 04/23/2015 1:45 PM Medical Record Number: [id number] Patient Account Number: [id number] Date of Birth/Gender: [birth year] (73 y.o. Female) Treating RN: [name] Primary Care Physician: SYSTEM, PCP Other Clinician: Referring Physician: Lorie [name] Treating Physician/Extender: [name] in Treatment: 5 Education Assessment Education Provided To: Patient Education Topics Provided Pressure: Handouts: Other: continue offloading heels Methods: Explain/Verbal Responses: State content correctly Electronic Signature(s) Signed: 04/23/2015 4:45:08 PM By: [name] Entered By: [name] on 04/23/2015 14:45:24 [name] A. ([id number]) -------------------------------------------------------------------------------- Wound Assessment Details Patient Name: [name] A. Date of Service: 04/23/2015 1:45 PM Medical Record Number: [id number] Patient Account Number: [id number] Date of Birth/Sex: [birth year] (73  y.o. Female) Treating RN: [name] Primary Care Physician: SYSTEM, PCP Other Clinician: Referring Physician: Lorie [name] Treating Physician/Extender: [name] in Treatment: 5 Wound Status Wound Number: 1 Primary Pressure Ulcer Etiology: Wound Location: Left Calcaneous Wound Open Wounding Event: Pressure  Injury Status: Date Acquired: 02/12/2015 Comorbid Anemia, Chronic Obstructive Pulmonary [name] Of Treatment: 5 History: Disease (COPD), Sleep Apnea, Clustered Wound: No Arrhythmia, Congestive Heart Failure, Coronary Artery Disease, Hypertension, Peripheral Venous Disease, Type II Diabetes, Osteoarthritis, Neuropathy Photos Photo Uploaded By: Elliot Gurney, RN, BSN, Kim on 04/23/2015 16:52:28 Wound Measurements Length: (cm) 1 Width: (cm) 1.9 Depth: (cm) 0.1 Area: (cm) 1.492 Volume: (cm) 0.149 % Reduction in Area: 79.1% % Reduction in Volume: 79.2% Epithelialization: Small (1-33%) Tunneling: No Undermining: No Wound Description Classification: Category/Stage III Diabetic Severity [address]): Grade 2 Wound Margin: Flat and Intact Exudate Amount: Large Exudate Type: Serous Exudate Color: amber Foul Odor After Cleansing: Yes Due to Product Use: No Wound Bed Matar, Lajuanda A. ([id number]) Granulation Amount: Large (67-100%) Exposed Structure Granulation Quality: Pink Fascia Exposed: No Necrotic Amount: Small (1-33%) Fat Layer Exposed: No Necrotic Quality: Eschar Tendon Exposed: No Muscle Exposed: No Joint Exposed: No Bone Exposed: No Limited to Skin Breakdown Periwound Skin Texture Texture Color No Abnormalities Noted: No No Abnormalities Noted: No Callus: No Atrophie Blanche: No Crepitus: No Cyanosis: No Excoriation: No Ecchymosis: No Fluctuance: No Erythema: No Friable: No Hemosiderin Staining: No Induration: No Mottled: No Localized Edema: No Pallor: No Rash: No Rubor: No Scarring: No Temperature / Pain Moisture Temperature: No Abnormality No Abnormalities Noted: No Dry / Scaly: No Maceration: No Moist: Yes Wound Preparation Ulcer Cleansing: Rinsed/Irrigated with Saline Topical Anesthetic Applied: Other: lidocaine 4%, Treatment Notes Wound #1 (Left Calcaneous) 1. Cleansed with: Clean wound with Normal Saline 2. Anesthetic Topical Lidocaine 4% cream  to wound bed prior to debridement 3. Peri-wound Care: Barrier cream 4. Dressing Applied: Prisma Ag 5. Secondary Dressing Applied Guaze, ABD and kerlix/Conform 7. Secured with Tape Notes MELIZA, KAGE A. ([id number]) stretch netting Electronic Signature(s) Signed: 04/23/2015 4:45:08 PM By: [name] Entered By: [name] on 04/23/2015 14:24:44 [name] ([id number]) -------------------------------------------------------------------------------- Vitals Details Patient Name: [name] A. Date of Service: 04/23/2015 1:45 PM Medical Record Number: [id number] Patient Account Number: [id number] Date of Birth/Sex: 1941-05-31 (73 y.o. Female) Treating RN: [name] Primary Care Physician: SYSTEM, PCP Other Clinician: Referring Physician: Lorie [name] Treating Physician/Extender: [name] in Treatment: 5 Vital Signs Time Taken: 14:17 Temperature (F): 98.1 Height (in): 62 Pulse (bpm): 63 Weight (lbs): 250 Respiratory Rate (breaths/min): 18 Body Mass Index (BMI): 45.7 Blood Pressure (mmHg): 130/46 Reference Range: 80 - 120 mg / dl Electronic Signature(s) Signed: 04/23/2015 4:45:08 PM By: [name] Entered By: [name] on 04/23/2015 14:17:59

## 2015-04-30 ENCOUNTER — Encounter: Payer: Medicare Other | Admitting: Surgery

## 2015-04-30 DIAGNOSIS — L89623 Pressure ulcer of left heel, stage 3: Secondary | ICD-10-CM | POA: Diagnosis not present

## 2015-05-01 NOTE — Progress Notes (Signed)
LEETA, GRIMME ([id number]) Visit Report for 04/30/2015 Chief Complaint Document Details Patient Name: [name], [name] 04/30/2015 11:45 Date of Service: AM Medical Record [id number] Number: Patient Account Number: [id number] 04-27-1941 (74 y.o. Treating RN: Date of Birth/Sex: Female) Other Clinician: Primary Care Physician: SYSTEM, PCP Treating Jevante Hollibaugh Referring Physician: Lorie Phenix Physician/Extender: Weeks in Treatment: 6 Information Obtained from: Patient Chief Complaint Patient presents to the wound care center for a consult due non healing wound. 74 year old patient who comes with the chief complaints of having a ulcerated area on her left heel for about a month. Electronic Signature(s) Signed: 04/30/2015 11:39:39 AM By: Evlyn Kanner MD, FACS Entered By: Evlyn Kanner on 04/30/2015 11:39:38 [name] ([id number]) -------------------------------------------------------------------------------- HPI Details Patient Name: [name], [name] 04/30/2015 11:45 Date of Service: AM Medical Record [id number] Number: Patient Account Number: [id number] Dec 05, 1940 (74 y.o. Treating RN: Date of Birth/Sex: Female) Other Clinician: Primary Care Physician: SYSTEM, PCP Treating Spirit Wernli Referring Physician: Lorie Phenix Physician/Extender: Weeks in Treatment: 6 History of Present Illness Location: bilateral pressure injuries to both heels, left more than right Quality: Patient reports No Pain. Severity: Patient states wound are getting worse. Duration: Patient has had the wound for > 1 months prior to seeking treatment at the wound center Context: The wound appeared gradually over time Modifying Factors: Consults to this date include:some local care as per the nursing home staff Associated Signs and Symptoms: Patient reports having:no sensation on her feet whatsoever HPI Description: 74 year old patient who is known to have several comorbidities including  Diabetes mellitus, opoid dependence, cervical spondylosis, GERD, anemia, dermatitis, general debility and pressure ulcers of both heels is here for an opinion regarding the latter. She does not check her blood sugar every day and does not know the last blood values which were done about 2 weeks ago. she has been a smoker all her life and was smoking up to a pack and a half a day but now smokes 1 packet in 3 days. PAST MEDICAL HISTORY: DM type 2, COPD, OSA, hypothyroidism, hypertension, diabetes, hyperlipidemia, CHF, morbid obesity, previous history of tobacco abuse, coronary artery disease, paroxysmal atrial fibrillation on Xarelto. 04/23/2015 -- he has been off loading well but still continues to smoke about 6 cigarettes a day. No other fresh issues. Electronic Signature(s) Signed: 04/30/2015 11:39:47 AM By: Evlyn Kanner MD, FACS Entered By: Evlyn Kanner on 04/30/2015 11:39:46 [name] ([id number]) -------------------------------------------------------------------------------- Physical Exam Details Patient Name: [name], [name] 04/30/2015 11:45 Date of Service: AM Medical Record [id number] Number: Patient Account Number: [id number] 04-03-1941 (74 y.o. Treating RN: Date of Birth/Sex: Female) Other Clinician: Primary Care Physician: SYSTEM, PCP Treating Evlyn Kanner Referring Physician: Lorie Phenix Physician/Extender: Weeks in Treatment: 6 Constitutional . Pulse regular. Respirations normal and unlabored. Afebrile. . Eyes Nonicteric. Reactive to light. Ears, Nose, Mouth, and Throat Lips, teeth, and gums WNL.Marland Kitchen Moist mucosa without lesions . Neck supple and nontender. No palpable supraclavicular or cervical adenopathy. Normal sized without goiter. Respiratory WNL. No retractions.. Cardiovascular Pedal Pulses WNL. No clubbing, cyanosis or edema. Lymphatic No adneopathy. No adenopathy. No adenopathy. Musculoskeletal Adexa without tenderness or enlargement.. Digits  and nails w/o clubbing, cyanosis, infection, petechiae, ischemia, or inflammatory conditions.. Integumentary (Hair, Skin) No suspicious lesions. No crepitus or fluctuance. No peri-wound warmth or erythema. No masses.Marland Kitchen Psychiatric Judgement and insight Intact.. No evidence of depression, anxiety, or agitation.. Notes The left heel wound looks much cleaner and the surrounding skin does not have any cellulitis or debris. Electronic Signature(s) Signed: 04/30/2015 11:41:05  AM By: Evlyn Kanner MD, FACS Entered By: Evlyn Kanner on 04/30/2015 11:41:04 [name] ([id number]) -------------------------------------------------------------------------------- Physician Orders Details Patient Name: LEVONNE, CARRERAS 04/30/2015 11:45 Date of Service: AM Medical Record [id number] Number: Patient Account Number: [id number] Mar 04, 1941 (74 y.o. Treating RN: Curtis Sites Date of Birth/Sex: Female) Other Clinician: Primary Care Physician: SYSTEM, PCP Treating Evlyn Kanner Referring Physician: Lorie Phenix Physician/Extender: Weeks in Treatment: 6 Verbal / Phone Orders: Yes Clinician: Curtis Sites Read Back and Verified: Yes Diagnosis Coding Wound Cleansing Wound #1 Left Calcaneous o Clean wound with Normal Saline. o May shower with protection. - may shower with dressing on and change dressing after shower Anesthetic Wound #1 Left Calcaneous o Topical Lidocaine 4% cream applied to wound bed prior to debridement Skin Barriers/Peri-Wound Care Wound #1 Left Calcaneous o Barrier cream - on reddened irritated skin Primary Wound Dressing Wound #1 Left Calcaneous o Prisma Ag - or other collagen with silver equivalent (calcium alginate is not collagen so please do not use this product) Secondary Dressing Wound #1 Left Calcaneous o Gauze, ABD and Kerlix/Conform - and stretch netting as needed to keep dressing intact - DO NOT PUT ANY ADHESIVE ON PATIENT'S SKIN Dressing Change  Frequency Wound #1 Left Calcaneous o Change dressing every other day. Follow-up Appointments Wound #1 Left Calcaneous o Return Appointment in 1 week. Off-Loading Wound #1 Left Calcaneous Kabler, Tauni A. ([id number]) o Heel suspension boot to: - sage boots Electronic Signature(s) Signed: 04/30/2015 12:13:47 PM By: Evlyn Kanner MD, FACS Signed: 04/30/2015 4:26:29 PM By: Curtis Sites Entered By: Curtis Sites on 04/30/2015 11:36:33 [name] ([id number]) -------------------------------------------------------------------------------- Problem List Details Patient Name: [name], [name] 04/30/2015 11:45 Date of Service: AM Medical Record [id number] Number: Patient Account Number: [id number] 18-Jun-1941 (73 y.o. 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 04/30/2015 11:45 Date of Service: AM Medical Record [id number] Number: Patient Account Number: [id number] [birth year] (73 y.o. Treating RN: Date of Birth/Sex: Female) Other Clinician: Primary  Care Physician: SYSTEM, PCP Treating Shavon Ashmore Referring Physician: Lorie Phenix Physician/Extender: Weeks in Treatment: 6 Subjective Chief Complaint Information obtained from Patient Patient presents to the wound care center for a consult due non healing wound. 74 year old patient who comes with the chief complaints of having a ulcerated area on her left heel for about a month. History of Present Illness (HPI) The following HPI elements were documented for the patient's wound: Location: bilateral pressure injuries to both heels, left more than right Quality: Patient reports No Pain. Severity: Patient states wound are getting worse. Duration: Patient has had the wound for > 1 months prior to seeking treatment at the wound center Context: The wound appeared gradually over time Modifying Factors: Consults to this date include:some local care as per the nursing home staff Associated Signs and Symptoms: Patient reports having:no sensation on her feet whatsoever 74 year old  patient who is known to have several comorbidities including Diabetes mellitus, opoid dependence, cervical spondylosis, GERD, anemia, dermatitis, general debility and pressure ulcers of both heels is here for an opinion regarding the latter. She does not check her blood sugar every day and does not know the last blood values which were done about 2 weeks ago. she has been a smoker all her life and was smoking up to a pack and a half a day but now smokes 1 packet in 3 days. PAST MEDICAL HISTORY: DM type 2, COPD, OSA, hypothyroidism, hypertension, diabetes, hyperlipidemia, CHF, morbid obesity, previous history of tobacco abuse, coronary artery disease, paroxysmal atrial fibrillation on Xarelto. 04/23/2015 -- he has been off loading well but still continues to smoke about 6 cigarettes a day. No other fresh issues. Objective Kabat, Brealyn A. ([id number]) Constitutional Pulse regular. Respirations normal and  unlabored. Afebrile. Vitals Time Taken: 11:21 AM, Height: 62 in, Weight: 250 lbs, BMI: 45.7, Temperature: 98.3 F, Pulse: 69 bpm, Respiratory Rate: 20 breaths/min, Blood Pressure: 159/56 mmHg. Eyes Nonicteric. Reactive to light. Ears, Nose, Mouth, and Throat Lips, teeth, and gums WNL.Marland Kitchen Moist mucosa without lesions . Neck supple and nontender. No palpable supraclavicular or cervical adenopathy. Normal sized without goiter. Respiratory WNL. No retractions.. Cardiovascular Pedal Pulses WNL. No clubbing, cyanosis or edema. Lymphatic No adneopathy. No adenopathy. No adenopathy. Musculoskeletal Adexa without tenderness or enlargement.. Digits and nails w/o clubbing, cyanosis, infection, petechiae, ischemia, or inflammatory conditions.Marland Kitchen Psychiatric Judgement and insight Intact.. No evidence of depression, anxiety, or agitation.. General Notes: The left heel wound looks much cleaner and the surrounding skin does not have any cellulitis or debris. Integumentary (Hair, Skin) No suspicious lesions. No crepitus or fluctuance. No peri-wound warmth or erythema. No masses.. Wound #1 status is Open. Original cause of wound was Pressure Injury. The wound is located on the Left Calcaneous. The wound measures 0.6cm length x 1.4cm width x 0.1cm depth; 0.66cm^2 area and 0.066cm^3 volume. The wound is limited to skin breakdown. There is no tunneling or undermining noted. There is a medium amount of serous drainage noted. The wound margin is flat and intact. There is large (67-100%) pink granulation within the wound bed. There is no necrotic tissue within the wound bed. The periwound skin appearance exhibited: Moist. The periwound skin appearance did not exhibit: Callus, Crepitus, Excoriation, Fluctuance, Friable, Induration, Localized Edema, Rash, Scarring, Dry/Scaly, Maceration, Atrophie Blanche, Cyanosis, Ecchymosis, Hemosiderin Staining, Mottled, Pallor, Rubor, Erythema. Periwound temperature was noted  as No Abnormality. 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 She is again urged to give up smoking. She will come back and see me next week. Plan Wound Cleansing: Wound #1 Left Calcaneous: Clean wound with Normal Saline. May shower with protection. - may shower with dressing on and change dressing after shower Anesthetic: Wound #1 Left Calcaneous: Topical Lidocaine 4% cream applied to wound bed prior to debridement Skin Barriers/Peri-Wound Care: Wound #1 Left Calcaneous: Barrier cream - on reddened irritated skin Primary Wound Dressing: Wound #1 Left Calcaneous: Prisma Ag - or other collagen with silver equivalent (calcium alginate is not  collagen so please do not use this product) Secondary Dressing: Wound #1 Left Calcaneous: Gauze, ABD and Kerlix/Conform - and stretch netting as needed to keep dressing intact - DO NOT PUT ANY ADHESIVE ON PATIENT'S SKIN Dressing Change Frequency: Wound #1 Left Calcaneous: Change dressing every other day. Follow-up Appointments: Wound #1 Left Calcaneous: Return Appointment in 1 week. Off-LoadingNASRIN, LANZO A. ([id number]) Wound #1 Left Calcaneous: Heel suspension boot to: - sage boots We will apply Prisma AG and a bordered foam heel offloading device and continue to offload as much as possible while she is in bed. She is again urged to give up smoking. She will come back and see me next week. Electronic Signature(s) Signed: 04/30/2015 11:41:54 AM By: Evlyn Kanner MD,  FACS Entered By: Evlyn Kanner on 04/30/2015 11:41:53 [name] ([id number]) -------------------------------------------------------------------------------- SuperBill Details Patient Name: Traci Iha A. Date of Service: 04/30/2015 Medical Record Number: [id number] Patient Account Number: [id number] Date of Birth/Sex: 06-21-1941 (74 y.o. 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 L89.623 Pressure ulcer of left heel, stage 3 E66.01 Morbid (severe) obesity due to excess calories F17.218 Nicotine dependence, cigarettes, with other nicotine-induced disorders Facility Procedures CPT4 Code: [id number] Description: 99213 - WOUND CARE VISIT-LEV 3 EST PT Modifier: Quantity: 1 Physician Procedures CPT4 Code Description: [id number] 99213 - WC PHYS LEVEL 3 - EST PT ICD-10 Description Diagnosis E11.621 Type 2 diabetes mellitus with foot ulcer E08.42 Diabetes mellitus due to underlying condition with L89.623 Pressure ulcer of left heel, stage 3 F17.218  Nicotine dependence, cigarettes, with other nicoti Modifier: diabetic poly ne-induced dis Quantity: 1 neuropathy orders Electronic Signature(s) Signed: 04/30/2015 11:42:19 AM By: Evlyn Kanner MD, FACS Entered By: Evlyn Kanner on 04/30/2015 11:42:18

## 2015-05-01 NOTE — Progress Notes (Signed)
[name], [name] ([id number]) Visit Report for 04/30/2015 Arrival Information Details Patient Name: [name], [name] Date of Service: 04/30/2015 11:45 AM Medical Record Number: [id number] Patient Account Number: [id number] Date of Birth/Sex: [birth year] (73 y.o. Female) Treating RN: Curtis Sites Primary Care Physician: SYSTEM, PCP Other Clinician: Referring Physician: Lorie Phenix Treating Physician/Extender: Rudene Re in Treatment: 6 Visit Information History Since Last Visit Added or deleted any medications: No Patient Arrived: Wheel Chair Any new allergies or adverse reactions: No Arrival Time: 11:21 Had a fall or experienced change in No Accompanied By: self activities of daily living that may affect Transfer Assistance: Manual risk of falls: Patient Identification Verified: Yes Signs or symptoms of abuse/neglect since last No Secondary Verification Process Yes visito Completed: Hospitalized since last visit: No Patient Has Alerts: Yes Pain Present Now: No Patient Alerts: Patient on Blood Thinner DMII xarelto, asa Electronic Signature(s) Signed: 04/30/2015 4:26:29 PM By: Curtis Sites Entered By: Curtis Sites on 04/30/2015 11:21:43 [name] ([id number]) -------------------------------------------------------------------------------- Clinic Level of Care Assessment Details Patient Name: Traci Iha A. Date of Service: 04/30/2015 11:45 AM Medical Record Number: [id number] Patient Account Number: [id number] Date of Birth/Sex: [birth year] (73 y.o. Female) Treating RN: Curtis Sites Primary Care Physician: SYSTEM, PCP Other Clinician: Referring Physician: Lorie Phenix Treating Physician/Extender: Rudene Re in Treatment: 6 Clinic Level of Care Assessment Items TOOL 4 Quantity Score []  - Use when only an EandM is performed on FOLLOW-UP visit 0 ASSESSMENTS - Nursing Assessment / Reassessment X - Reassessment of Co-morbidities (includes  updates in patient status) 1 10 X - Reassessment of Adherence to Treatment Plan 1 5 ASSESSMENTS - Wound and Skin Assessment / Reassessment X - Simple Wound Assessment / Reassessment - one wound 1 5 []  - Complex Wound Assessment / Reassessment - multiple wounds 0 []  - Dermatologic / Skin Assessment (not related to wound area) 0 ASSESSMENTS - Focused Assessment []  - Circumferential Edema Measurements - multi extremities 0 []  - Nutritional Assessment / Counseling / Intervention 0 X - Lower Extremity Assessment (monofilament, tuning fork, pulses) 1 5 []  - Peripheral Arterial Disease Assessment (using hand held doppler) 0 ASSESSMENTS - Ostomy and/or Continence Assessment and Care []  - Incontinence Assessment and Management 0 []  - Ostomy Care Assessment and Management (repouching, etc.) 0 PROCESS - Coordination of Care X - Simple Patient / Family Education for ongoing care 1 15 []  - Complex (extensive) Patient / Family Education for ongoing care 0 []  - Staff obtains Chiropractor, Records, Test Results / Process Orders 0 []  - Staff telephones HHA, Nursing Homes / Clarify orders / etc 0 []  - Routine Transfer to another Facility (non-emergent condition) 0 [name], [name] ([id number]) []  - Routine Hospital Admission (non-emergent condition) 0 []  - New Admissions / Manufacturing engineer / Ordering NPWT, Apligraf, etc. 0 []  - Emergency Hospital Admission (emergent condition) 0 X - Simple Discharge Coordination 1 10 []  - Complex (extensive) Discharge Coordination 0 PROCESS - Special Needs []  - Pediatric / Minor Patient Management 0 []  - Isolation Patient Management 0 []  - Hearing / Language / Visual special needs 0 []  - Assessment of Community assistance (transportation, D/C planning, etc.) 0 []  - Additional assistance / Altered mentation 0 []  - Support Surface(s) Assessment (bed, cushion, seat, etc.) 0 INTERVENTIONS - Wound Cleansing / Measurement X - Simple Wound Cleansing - one wound 1 5 []  -  Complex Wound Cleansing - multiple wounds 0 X - Wound Imaging (photographs - any number of wounds) 1 5 []  - Wound Tracing (  instead of photographs) 0 X - Simple Wound Measurement - one wound 1 5 []  - Complex Wound Measurement - multiple wounds 0 INTERVENTIONS - Wound Dressings X - Small Wound Dressing one or multiple wounds 1 10 []  - Medium Wound Dressing one or multiple wounds 0 []  - Large Wound Dressing one or multiple wounds 0 []  - Application of Medications - topical 0 []  - Application of Medications - injection 0 INTERVENTIONS - Miscellaneous []  - External ear exam 0 [name], [name] ([id number]) []  - Specimen Collection (cultures, biopsies, blood, body fluids, etc.) 0 []  - Specimen(s) / Culture(s) sent or taken to Lab for analysis 0 []  - Patient Transfer (multiple staff / Michiel Sites Lift / Similar devices) 0 []  - Simple Staple / Suture removal (25 or less) 0 []  - Complex Staple / Suture removal (26 or more) 0 []  - Hypo / Hyperglycemic Management (close monitor of Blood Glucose) 0 []  - Ankle / Brachial Index (ABI) - do not check if billed separately 0 X - Vital Signs 1 5 Has the patient been seen at the hospital within the last three years: Yes Total Score: 80 Level Of Care: New/Established - Level 3 Electronic Signature(s) Signed: 04/30/2015 4:26:29 PM By: Curtis Sites Entered By: Curtis Sites on 04/30/2015 11:37:33 [name] ([id number]) -------------------------------------------------------------------------------- Encounter Discharge Information Details Patient Name: Traci Iha A. Date of Service: 04/30/2015 11:45 AM Medical Record Number: [id number] Patient Account Number: [id number] Date of Birth/Sex: 1941/08/17 (73 y.o. Female) Treating RN: Curtis Sites Primary Care Physician: SYSTEM, PCP Other Clinician: Referring Physician: Lorie Phenix Treating Physician/Extender: Rudene Re in Treatment: 6 Encounter Discharge Information Items Discharge Pain  Level: 0 Discharge Condition: Stable Ambulatory Status: Wheelchair Nursing Discharge Destination: Home Transportation: Private Auto Accompanied By: SELF Schedule Follow-up Appointment: Yes Medication Reconciliation completed No and provided to Patient/Care Kennedee Kitzmiller: Clinical Summary of Care: Electronic Signature(s) Signed: 04/30/2015 4:26:29 PM By: Curtis Sites Entered By: Curtis Sites on 04/30/2015 11:50:16 [name] ([id number]) -------------------------------------------------------------------------------- Lower Extremity Assessment Details Patient Name: Traci Iha A. Date of Service: 04/30/2015 11:45 AM Medical Record Number: [id number] Patient Account Number: [id number] Date of Birth/Sex: Nov 03, 1940 (73 y.o. Female) Treating RN: Curtis Sites Primary Care Physician: SYSTEM, PCP Other Clinician: Referring Physician: Lorie Phenix Treating Physician/Extender: Rudene Re in Treatment: 6 Vascular Assessment Pulses: Posterior Tibial Dorsalis Pedis Palpable: [Left:Yes] Extremity colors, hair growth, and conditions: Extremity Color: [Left:Normal] Hair Growth on Extremity: [Left:No] Temperature of Extremity: [Left:Warm] Capillary Refill: [Left:< 3 seconds] Toe Nail Assessment Left: Right: Thick: No Discolored: No Deformed: No Improper Length and Hygiene: No Electronic Signature(s) Signed: 04/30/2015 4:26:29 PM By: Curtis Sites Entered By: Curtis Sites on 04/30/2015 11:23:45 [name] ([id number]) -------------------------------------------------------------------------------- Multi Wound Chart Details Patient Name: Traci Iha A. Date of Service: 04/30/2015 11:45 AM Medical Record Number: [id number] Patient Account Number: [id number] Date of Birth/Sex: 15-May-1941 (73 y.o. Female) Treating RN: Curtis Sites Primary Care Physician: SYSTEM, PCP Other Clinician: Referring Physician: Lorie Phenix Treating Physician/Extender: Rudene Re in Treatment: 6 Vital Signs Height(in): 62 Pulse(bpm): 69 Weight(lbs): 250 Blood Pressure 159/56 (mmHg): Body Mass Index(BMI): 46 Temperature(F): 98.3 Respiratory Rate 20 (breaths/min): Photos: [1:No Photos] [N/A:N/A] Wound Location: [1:Left Calcaneous] [N/A:N/A] Wounding Event: [1:Pressure Injury] [N/A:N/A] Primary Etiology: [1:Pressure Ulcer] [N/A:N/A] Comorbid History: [1:Anemia, Chronic Obstructive Pulmonary Disease (COPD), Sleep Apnea, Arrhythmia, Congestive Heart Failure, Coronary Artery Disease, Hypertension, Peripheral Venous Disease, Type II Diabetes, Osteoarthritis, Neuropathy] [N/A:N/A] Date Acquired: [1:02/12/2015] [N/A:N/A] Weeks of Treatment: [1:6] [N/A:N/A] Wound Status: [1:Open] [N/A:N/A] Measurements L x  W x D 0.6x1.4x0.1 [N/A:N/A] (cm) Area (cm) : [1:0.66] [N/A:N/A] Volume (cm) : [1:0.066] [N/A:N/A] % Reduction in Area: [1:90.80%] [N/A:N/A] % Reduction in Volume: 90.80% [N/A:N/A] Classification: [1:Category/Stage III] [N/A:N/A] HBO Classification: [1:Grade 2] [N/A:N/A] Exudate Amount: [1:Medium] [N/A:N/A] Exudate Type: [1:Serous] [N/A:N/A] Exudate Color: [1:amber] [N/A:N/A] Foul Odor After [1:Yes] [N/A:N/A] Cleansing: Demartin, Layci A. ([id number]) Odor Anticipated Due to No N/A N/A Product Use: Wound Margin: Flat and Intact N/A N/A Granulation Amount: Large (67-100%) N/A N/A Granulation Quality: Pink N/A N/A Necrotic Amount: None Present (0%) N/A N/A Exposed Structures: Fascia: No N/A N/A Fat: No Tendon: No Muscle: No Joint: No Bone: No Limited to Skin Breakdown Epithelialization: Small (1-33%) N/A N/A Periwound Skin Texture: Edema: No N/A N/A Excoriation: No Induration: No Callus: No Crepitus: No Fluctuance: No Friable: No Rash: No Scarring: No Periwound Skin Moist: Yes N/A N/A Moisture: Maceration: No Dry/Scaly: No Periwound Skin Color: Atrophie Blanche: No N/A N/A Cyanosis: No Ecchymosis: No Erythema:  No Hemosiderin Staining: No Mottled: No Pallor: No Rubor: No Temperature: No Abnormality N/A N/A Tenderness on No N/A N/A Palpation: Wound Preparation: Ulcer Cleansing: N/A N/A Rinsed/Irrigated with Saline Topical Anesthetic Applied: Other: lidocaine 4% Treatment Notes Electronic Signature(s) [name], [name] ([id number]) Signed: 04/30/2015 4:26:29 PM By: Curtis Sites Entered By: Curtis Sites on 04/30/2015 11:29:40 [name] ([id number]) -------------------------------------------------------------------------------- Multi-Disciplinary Care Plan Details Patient Name: Traci Iha A. Date of Service: 04/30/2015 11:45 AM Medical Record Number: [id number] Patient Account Number: [id number] Date of Birth/Sex: 1941-08-24 (73 y.o. Female) Treating RN: Curtis Sites Primary Care Physician: SYSTEM, PCP Other Clinician: Referring Physician: Lorie Phenix Treating Physician/Extender: Rudene Re in Treatment: 6 Active Inactive Abuse / Safety / Falls / Self Care Management Nursing Diagnoses: Potential for falls Goals: Patient will remain injury free Date Initiated: 03/16/2015 Goal Status: Active Interventions: Assess fall risk on admission and as needed Notes: Orientation to the Wound Care Program Nursing Diagnoses: Knowledge deficit related to the wound healing center program Goals: Patient/caregiver will verbalize understanding of the Wound Healing Center Program Date Initiated: 03/16/2015 Goal Status: Active Interventions: Provide education on orientation to the wound center Notes: Pressure Nursing Diagnoses: Potential for impaired tissue integrity related to pressure, friction, moisture, and shear Goals: Patient will remain free from development of additional pressure ulcers Date Initiated: 03/16/2015 [name] ([id number]) Goal Status: Active Interventions: Assess potential for pressure ulcer upon admission and as needed Notes: Wound/Skin  Impairment Nursing Diagnoses: Impaired tissue integrity Goals: Ulcer/skin breakdown will have a volume reduction of 30% by week 4 Date Initiated: 03/16/2015 Goal Status: Active Interventions: Assess patient/caregiver ability to perform ulcer/skin care regimen upon admission and as needed Notes: Electronic Signature(s) Signed: 04/30/2015 4:26:29 PM By: Curtis Sites Entered By: Curtis Sites on 04/30/2015 11:29:32 [name] ([id number]) -------------------------------------------------------------------------------- Patient/Caregiver Education Details Patient Name: Traci Iha A. Date of Service: 04/30/2015 11:45 AM Medical Record Number: [id number] Patient Account Number: [id number] Date of Birth/Gender: [birth year] (73 y.o. Female) Treating RN: Curtis Sites Primary Care Physician: SYSTEM, PCP Other Clinician: Referring Physician: Lorie Phenix Treating Physician/Extender: Rudene Re in Treatment: 6 Education Assessment Education Provided To: Patient Education Topics Provided Offloading: Handouts: Other: offloading to prevent pressure ulcers Methods: Demonstration, Explain/Verbal Responses: State content correctly Electronic Signature(s) Signed: 04/30/2015 11:32:04 AM By: Curtis Sites Entered By: Curtis Sites on 04/30/2015 11:32:04 [name] ([id number]) -------------------------------------------------------------------------------- Wound Assessment Details Patient Name: Traci Iha A. Date of Service: 04/30/2015 11:45 AM Medical Record Number: [id number] Patient Account Number: [id number] Date of Birth/Sex: Mar 05, 1941 (73 y.o.  Female) Treating RN: Curtis Sites Primary Care Physician: SYSTEM, PCP Other Clinician: Referring Physician: Lorie Phenix Treating Physician/Extender: Rudene Re in Treatment: 6 Wound Status Wound Number: 1 Primary Pressure Ulcer Etiology: Wound Location: Left Calcaneous Wound Open Wounding Event:  Pressure Injury Status: Date Acquired: 02/12/2015 Comorbid Anemia, Chronic Obstructive Pulmonary Weeks Of Treatment: 6 History: Disease (COPD), Sleep Apnea, Clustered Wound: No Arrhythmia, Congestive Heart Failure, Coronary Artery Disease, Hypertension, Peripheral Venous Disease, Type II Diabetes, Osteoarthritis, Neuropathy Photos Photo Uploaded By: Curtis Sites on 04/30/2015 12:06:41 Wound Measurements Length: (cm) 0.6 Width: (cm) 1.4 Depth: (cm) 0.1 Area: (cm) 0.66 Volume: (cm) 0.066 % Reduction in Area: 90.8% % Reduction in Volume: 90.8% Epithelialization: Small (1-33%) Tunneling: No Undermining: No Wound Description Classification: Category/Stage III Diabetic Severity [address]): Grade 2 Wound Margin: Flat and Intact Exudate Amount: Medium Exudate Type: Serous Exudate Color: amber Foul Odor After Cleansing: Yes Due to Product Use: No Wound Bed [name], [name] ([id number]) Granulation Amount: Large (67-100%) Exposed Structure Granulation Quality: Pink Fascia Exposed: No Necrotic Amount: None Present (0%) Fat Layer Exposed: No Tendon Exposed: No Muscle Exposed: No Joint Exposed: No Bone Exposed: No Limited to Skin Breakdown Periwound Skin Texture Texture Color No Abnormalities Noted: No No Abnormalities Noted: No Callus: No Atrophie Blanche: No Crepitus: No Cyanosis: No Excoriation: No Ecchymosis: No Fluctuance: No Erythema: No Friable: No Hemosiderin Staining: No Induration: No Mottled: No Localized Edema: No Pallor: No Rash: No Rubor: No Scarring: No Temperature / Pain Moisture Temperature: No Abnormality No Abnormalities Noted: No Dry / Scaly: No Maceration: No Moist: Yes Wound Preparation Ulcer Cleansing: Rinsed/Irrigated with Saline Topical Anesthetic Applied: Other: lidocaine 4%, Treatment Notes Wound #1 (Left Calcaneous) 1. Cleansed with: Clean wound with Normal Saline 2. Anesthetic Topical Lidocaine 4% cream to wound bed  prior to debridement 3. Peri-wound Care: Barrier cream 4. Dressing Applied: Prisma Ag 5. Secondary Dressing Applied ABD and Kerlix/Conform 7. Secured with Tape Notes [name], [name] ([id number]) stretch netting Electronic Signature(s) Signed: 04/30/2015 4:26:29 PM By: Curtis Sites Entered By: Curtis Sites on 04/30/2015 11:29:23 [name] ([id number]) -------------------------------------------------------------------------------- Vitals Details Patient Name: Traci Iha A. Date of Service: 04/30/2015 11:45 AM Medical Record Number: [id number] Patient Account Number: [id number] Date of Birth/Sex: November 19, 1940 (73 y.o. Female) Treating RN: Curtis Sites Primary Care Physician: SYSTEM, PCP Other Clinician: Referring Physician: Lorie Phenix Treating Physician/Extender: Rudene Re in Treatment: 6 Vital Signs Time Taken: 11:21 Temperature (F): 98.3 Height (in): 62 Pulse (bpm): 69 Weight (lbs): 250 Respiratory Rate (breaths/min): 20 Body Mass Index (BMI): 45.7 Blood Pressure (mmHg): 159/56 Reference Range: 80 - 120 mg / dl Electronic Signature(s) Signed: 04/30/2015 4:26:29 PM By: Curtis Sites Entered By: Curtis Sites on 04/30/2015 11:22:49

## 2015-05-09 ENCOUNTER — Ambulatory Visit: Admission: RE | Admit: 2015-05-09 | Payer: Medicare Other | Source: Ambulatory Visit | Admitting: Gastroenterology

## 2015-05-09 ENCOUNTER — Encounter: Admission: RE | Payer: Self-pay | Source: Ambulatory Visit

## 2015-05-09 SURGERY — MANOMETRY, ESOPHAGUS

## 2015-05-10 ENCOUNTER — Encounter: Payer: Medicare Other | Attending: Surgery | Admitting: Surgery

## 2015-05-10 DIAGNOSIS — E785 Hyperlipidemia, unspecified: Secondary | ICD-10-CM | POA: Insufficient documentation

## 2015-05-10 DIAGNOSIS — L89623 Pressure ulcer of left heel, stage 3: Secondary | ICD-10-CM | POA: Diagnosis not present

## 2015-05-10 DIAGNOSIS — I509 Heart failure, unspecified: Secondary | ICD-10-CM | POA: Insufficient documentation

## 2015-05-10 DIAGNOSIS — G4733 Obstructive sleep apnea (adult) (pediatric): Secondary | ICD-10-CM | POA: Insufficient documentation

## 2015-05-10 DIAGNOSIS — E039 Hypothyroidism, unspecified: Secondary | ICD-10-CM | POA: Insufficient documentation

## 2015-05-10 DIAGNOSIS — J449 Chronic obstructive pulmonary disease, unspecified: Secondary | ICD-10-CM | POA: Insufficient documentation

## 2015-05-10 DIAGNOSIS — E1140 Type 2 diabetes mellitus with diabetic neuropathy, unspecified: Secondary | ICD-10-CM | POA: Diagnosis not present

## 2015-05-10 DIAGNOSIS — I2510 Atherosclerotic heart disease of native coronary artery without angina pectoris: Secondary | ICD-10-CM | POA: Diagnosis not present

## 2015-05-10 DIAGNOSIS — I480 Paroxysmal atrial fibrillation: Secondary | ICD-10-CM | POA: Diagnosis not present

## 2015-05-10 DIAGNOSIS — F17218 Nicotine dependence, cigarettes, with other nicotine-induced disorders: Secondary | ICD-10-CM | POA: Diagnosis not present

## 2015-05-10 DIAGNOSIS — I10 Essential (primary) hypertension: Secondary | ICD-10-CM | POA: Diagnosis not present

## 2015-05-10 DIAGNOSIS — E11621 Type 2 diabetes mellitus with foot ulcer: Secondary | ICD-10-CM | POA: Diagnosis not present

## 2015-05-10 NOTE — Progress Notes (Signed)
Traci, [name] ([id number]) Visit Report for 05/10/2015 Chief Complaint Document Details Patient Name: Traci, HEM A. Date of Service: 05/10/2015 1:00 PM Medical Record Number: [id number] Patient Account Number: [id number] Date of Birth/Sex: 1941-10-06 (73 y.o. Female) Treating [name]: [name] Primary Care Physician: SYSTEM, PCP Other Clinician: Referring Physician: Lorie [name] Treating Physician/Extender: [name] in Treatment: 7 Information Obtained from: Patient Chief Complaint Patient presents to the wound care center for a consult due non healing wound. 74 year old patient who comes with the chief complaints of having a ulcerated area on her left heel for about a month. Electronic Signature(s) Signed: 05/10/2015 1:47:34 PM By: Traci Kanner MD, FACS Entered By: [name] on 05/10/2015 13:47:34 [name] ([id number]) -------------------------------------------------------------------------------- HPI Details Patient Name: Traci Iha A. Date of Service: 05/10/2015 1:00 PM Medical Record Number: [id number] Patient Account Number: [id number] Date of Birth/Sex: 11-04-1940 (73 y.o. Female) Treating [name]: [name] Primary Care Physician: SYSTEM, PCP Other Clinician: Referring Physician: Lorie [name] Treating Physician/Extender: [name] in Treatment: 7 History of Present Illness Location: bilateral pressure injuries to both heels, left more than right Quality: Patient reports No Pain. Severity: Patient states wound are getting worse. Duration: Patient has had the wound for > 1 months prior to seeking treatment at the wound center Context: The wound appeared gradually over time Modifying Factors: Consults to this date include:some local care as per the nursing home staff Associated Signs and Symptoms: Patient reports having:no sensation on her feet whatsoever HPI Description: 74 year old patient who is known to have several comorbidities including  Diabetes mellitus, opoid dependence, cervical spondylosis, GERD, anemia, dermatitis, general debility and pressure ulcers of both heels is here for an opinion regarding the latter. She does not check her blood sugar every day and does not know the last blood values which were done about 2 weeks ago. she has been a smoker all her life and was smoking up to a pack and a half a day but now smokes 1 packet in 3 days. PAST MEDICAL HISTORY: DM type 2, COPD, OSA, hypothyroidism, hypertension, diabetes, hyperlipidemia, CHF, morbid obesity, previous history of tobacco abuse, coronary artery disease, paroxysmal atrial fibrillation on Xarelto. 04/23/2015 -- he has been off loading well but still continues to smoke about 6 cigarettes a day. No other fresh issues. Electronic Signature(s) Signed: 05/10/2015 1:47:40 PM By: Traci Kanner MD, FACS Entered By: [name] on 05/10/2015 13:47:40 [name] ([id number]) -------------------------------------------------------------------------------- Physical Exam Details Patient Name: Traci Iha A. Date of Service: 05/10/2015 1:00 PM Medical Record Number: [id number] Patient Account Number: [id number] Date of Birth/Sex: Mar 18, 1941 (73 y.o. Female) Treating [name]: [name] Primary Care Physician: SYSTEM, PCP Other Clinician: Referring Physician: Lorie [name] Treating Physician/Extender: [name] in Treatment: 7 Constitutional . Pulse regular. Respirations normal and unlabored. Afebrile. . Eyes Nonicteric. Reactive to light. Ears, Nose, Mouth, and Throat Lips, teeth, and gums WNL.Marland Kitchen Moist mucosa without lesions . Neck supple and nontender. No palpable supraclavicular or cervical adenopathy. Normal sized without goiter. Respiratory WNL. No retractions.. Cardiovascular Pedal Pulses WNL. No clubbing, cyanosis or edema. Lymphatic No adneopathy. No adenopathy. No adenopathy. Musculoskeletal Adexa without tenderness or enlargement..  Digits and nails w/o clubbing, cyanosis, infection, petechiae, ischemia, or inflammatory conditions.. Integumentary (Hair, Skin) No suspicious lesions. No crepitus or fluctuance. No peri-wound warmth or erythema. No masses.Marland Kitchen Psychiatric Judgement and insight Intact.. No evidence of depression, anxiety, or agitation.. Notes overall there is excellent resolution of her problem and the wound looks very clean and has healthy  granulation tissue. Electronic Signature(s) Signed: 05/10/2015 1:48:14 PM By: Traci Kanner MD, FACS Entered By: [name] on 05/10/2015 13:48:14 [name] ([id number]) -------------------------------------------------------------------------------- Physician Orders Details Patient Name: Traci Iha A. Date of Service: 05/10/2015 1:00 PM Medical Record Patient Account Number: [id number] Number: [name], [name], Treating [name]: 1941/04/22 (74 y.o. [place] Sink Date of Birth/Sex: Female) Other Clinician: Primary Care Physician: SYSTEM, PCP Treating [name] Referring Physician: Lorie [name] Physician/Extender: Weeks in Treatment: 7 Verbal / Phone Orders: Yes Clinician: [name], [name], [name], [name] Read Back and Verified: Yes Diagnosis Coding Wound Cleansing Wound #1 Left Calcaneous o Clean wound with Normal Saline. o May shower with protection. - may shower with dressing on and change dressing after shower Anesthetic Wound #1 Left Calcaneous o Topical Lidocaine 4% cream applied to wound bed prior to debridement Skin Barriers/Peri-Wound Care Wound #1 Left Calcaneous o Barrier cream - on reddened irritated skin Primary Wound Dressing Wound #1 Left Calcaneous o Prisma Ag - or other collagen with silver equivalent (calcium alginate is not collagen so please do not use this product) Secondary Dressing Wound #1 Left Calcaneous o Gauze, ABD and Kerlix/Conform - and stretch netting as needed to keep dressing intact - DO NOT PUT ANY ADHESIVE ON  PATIENT'S SKIN Dressing Change Frequency Wound #1 Left Calcaneous o Change dressing every other day. Follow-up Appointments Wound #1 Left Calcaneous o Return Appointment in 1 week. Off-Loading Wound #1 Left Calcaneous Bera, Gissella A. ([id number]) o Heel suspension boot to: - sage boots Electronic Signature(s) Signed: 05/10/2015 1:40:38 PM By: Elpidio Eric BSN, [name] Signed: 05/10/2015 3:04:50 PM By: Traci Kanner MD, FACS Entered By: Elpidio Eric on 05/10/2015 13:40:38 [name] ([id number]) -------------------------------------------------------------------------------- Problem List Details Patient Name: Traci Iha A. Date of Service: 05/10/2015 1:00 PM Medical Record Number: [id number] Patient Account Number: [id number] Date of Birth/Sex: 06-11-1941 (73 y.o. 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 Date of Service: 05/10/2015 1:00 PM Medical Record Number: [id number] Patient Account Number: [id number] Date of Birth/Sex: 17-Mar-1941 (73 y.o. Female) Treating  [name]: [name] Primary Care Physician: SYSTEM, PCP Other Clinician: Referring Physician: Lorie [name] Treating Physician/Extender: [name] in Treatment: 7 Subjective Chief Complaint Information obtained from Patient Patient presents to the wound care center for a consult due non healing wound. 74 year old patient who comes with the chief complaints of having a ulcerated area on her left heel for about a month. History of Present Illness (HPI) The following HPI elements were documented for the patient's wound: Location: bilateral pressure injuries to both heels, left more than right Quality: Patient reports No Pain. Severity: Patient states wound are getting worse. Duration: Patient has had the wound for > 1 months prior to seeking treatment at the wound center Context: The wound appeared gradually over time Modifying Factors: Consults to this date include:some local care as per  the nursing home staff Associated Signs and Symptoms: Patient reports having:no sensation on her feet whatsoever 74 year old patient who is known to have several comorbidities including Diabetes mellitus, opoid dependence, cervical spondylosis, GERD, anemia, dermatitis, general debility and pressure ulcers of both heels is here for an opinion regarding the latter. She does not check her blood sugar every day and does not know the last blood values which were done about 2 weeks ago. she has been a smoker all her life and was smoking up to a pack and a half a day but now smokes 1 packet in 3 days. PAST MEDICAL HISTORY: DM type 2, COPD, OSA, hypothyroidism, hypertension, diabetes, hyperlipidemia, CHF, morbid obesity, previous history of tobacco abuse, coronary artery disease, paroxysmal atrial fibrillation on Xarelto. 04/23/2015 -- he has been off loading well but still continues to smoke about 6 cigarettes a day. No other fresh issues. Objective Constitutional Traci, Marthe A. ([id number]) Pulse  regular. Respirations normal and unlabored. Afebrile. Vitals Time Taken: 1:21 PM, Height: 62 in, Weight: 250 lbs, BMI: 45.7, Temperature: 99.1 F, Pulse: 70 bpm, Respiratory Rate: 20 breaths/min, Blood Pressure: 142/70 mmHg. Eyes Nonicteric. Reactive to light. Ears, Nose, Mouth, and Throat Lips, teeth, and gums WNL.Marland Kitchen Moist mucosa without lesions . Neck supple and nontender. No palpable supraclavicular or cervical adenopathy. Normal sized without goiter. Respiratory WNL. No retractions.. Cardiovascular Pedal Pulses WNL. No clubbing, cyanosis or edema. Lymphatic No adneopathy. No adenopathy. No adenopathy. Musculoskeletal Adexa without tenderness or enlargement.. Digits and nails w/o clubbing, cyanosis, infection, petechiae, ischemia, or inflammatory conditions.Marland Kitchen Psychiatric Judgement and insight Intact.. No evidence of depression, anxiety, or agitation.. General Notes: overall there is excellent resolution of her problem and the wound looks very clean and has healthy granulation tissue. Integumentary (Hair, Skin) No suspicious lesions. No crepitus or fluctuance. No peri-wound warmth or erythema. No masses.. Wound #1 status is Open. Original cause of wound was Pressure Injury. The wound is located on the Left Calcaneous. The wound measures 0.4cm length x 0.8cm width x 0.8cm depth; 0.251cm^2 area and 0.201cm^3 volume. The wound is limited to skin breakdown. There is no tunneling or undermining noted. There is a medium amount of serous drainage noted. The wound margin is flat and intact. There is large (67-100%) pink granulation within the wound bed. There is no necrotic tissue within the wound bed. The periwound skin appearance exhibited: Callus, Moist. The periwound skin appearance did not exhibit: Crepitus, Excoriation, Fluctuance, Friable, Induration, Localized Edema, Rash, Scarring, Dry/Scaly, Maceration, Atrophie Blanche, Cyanosis, Ecchymosis, Hemosiderin Staining, Mottled, Pallor,  Rubor, Erythema. Periwound temperature was noted as No Abnormality. 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 Smoking cessation has been discussed with her again but she does not want to be compliant. She will come back and see me next week. Plan Wound Cleansing: Wound #1 Left Calcaneous: Clean wound with Normal Saline. May shower with protection. - may shower with dressing on and change dressing after shower Anesthetic: Wound #1 Left Calcaneous: Topical Lidocaine 4% cream applied to wound bed prior to debridement Skin Barriers/Peri-Wound Care: Wound #1 Left Calcaneous: Barrier cream - on reddened irritated skin Primary Wound Dressing:  Wound #1 Left Calcaneous: Prisma Ag - or other collagen with silver equivalent (calcium alginate is not collagen so please do not use this product) Secondary Dressing: Wound #1 Left Calcaneous: Gauze, ABD and Kerlix/Conform - and stretch netting as needed to keep dressing intact - DO NOT PUT ANY ADHESIVE ON PATIENT'S SKIN Dressing Change Frequency: Wound #1 Left Calcaneous: Change dressing every other day. Follow-up Appointments: Wound #1 Left Calcaneous: Return Appointment in 1 week. Off-LoadingZETHA, KUHAR A. ([id number]) Wound #1 Left Calcaneous: Heel suspension boot to: - sage boots We will continue with silver collagen and appropriate offloading and I anticipate she will heal soon. Smoking cessation has been discussed with her again but she does not want to be compliant. She will come back and see me  next week. Electronic Signature(s) Signed: 05/10/2015 1:49:45 PM By: Traci Kanner MD, FACS Entered By: [name] on 05/10/2015 13:49:44 [name] ([id number]) -------------------------------------------------------------------------------- SuperBill Details Patient Name: Traci Iha A. Date of Service: 05/10/2015 Medical Record Number: [id number] Patient Account Number: [id number] Date of Birth/Sex: 05-Oct-1941 (74 y.o. 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 L89.623 Pressure ulcer of left heel, stage 3 E66.01 Morbid (severe) obesity due to excess calories F17.218 Nicotine dependence, cigarettes, with other nicotine-induced disorders Facility Procedures CPT4 Code: 08657846 Description: 99213 - WOUND CARE VISIT-LEV 3 EST PT Modifier: Quantity: 1 Physician Procedures CPT4 Code Description: [id number] 99213 - WC PHYS LEVEL 3 - EST PT ICD-10 Description Diagnosis E11.621 Type 2 diabetes mellitus with foot ulcer E08.42 Diabetes mellitus due to underlying condition with L89.623 Pressure ulcer of left heel, stage 3 Modifier: diabetic poly Quantity: 1 neuropathy Electronic Signature(s) Signed: 05/10/2015 1:49:59 PM By: Traci Kanner MD, FACS Entered By: [name] on 05/10/2015 13:49:59

## 2015-05-10 NOTE — Progress Notes (Signed)
[name], [name] ([id number]) Visit Report for 05/10/2015 Arrival Information Details Patient Name: [name], [name] Date of Service: 05/10/2015 1:00 PM Medical Record Number: [id number] Patient Account Number: [id number] Date of Birth/Sex: 1941/02/09 (73 y.o. Female) Treating RN: [name],  [name] Primary Care Physician: SYSTEM, PCP Other Clinician: Referring Physician: Lorie [name] Treating Physician/Extender: [name] in Treatment: 7 Visit Information History Since Last Visit Any new allergies or adverse reactions: No Patient Arrived: Wheel Chair Had a fall or experienced change in No Arrival Time: 13:17 activities of daily living that may affect Accompanied By: self risk of falls: Transfer Assistance: None Signs or symptoms of abuse/neglect since last No Patient Identification Verified: Yes visito Secondary Verification Process Yes Hospitalized since last visit: No Completed: Has Dressing in Place as Prescribed: Yes Patient Has Alerts: Yes Has Footwear/Offloading in Place as Yes Patient Alerts: Patient on Blood Prescribed: Thinner Pain Present Now: No DMII xarelto, asa Electronic Signature(s) Signed: 05/10/2015 3:16:53 PM By: [name] BSN, RN Entered By: [name] on 05/10/2015 13:20:45 [name] ([id number]) -------------------------------------------------------------------------------- Clinic Level of Care Assessment Details Patient Name: [name] A. Date of Service: 05/10/2015 1:00 PM Medical Record Number: [id number] Patient Account Number: [id number] Date of Birth/Sex: Sep 21, 1941 (73 y.o. Female) Treating RN: [name], Rita Primary Care Physician: SYSTEM, PCP Other Clinician: Referring Physician: Lorie [name] Treating Physician/Extender: [name] in Treatment: 7 Clinic Level of Care Assessment Items TOOL 4 Quantity Score  - Use when only an EandM is performed on FOLLOW-UP visit 0 ASSESSMENTS - Nursing Assessment  / Reassessment X - Reassessment of Co-morbidities (includes updates in patient status) 1 10 X - Reassessment of Adherence to Treatment Plan 1 5 ASSESSMENTS - Wound and Skin Assessment / Reassessment X - Simple Wound Assessment / Reassessment - one wound 1 5  - Complex Wound Assessment / Reassessment - multiple wounds 0  - Dermatologic / Skin Assessment (not related to wound area) 0 ASSESSMENTS - Focused Assessment  - Circumferential Edema Measurements - multi extremities 0  - Nutritional Assessment / Counseling / Intervention 0 X - Lower Extremity Assessment (monofilament, tuning fork, pulses) 1 5  - Peripheral Arterial Disease Assessment (using hand held doppler) 0 ASSESSMENTS - Ostomy and/or Continence Assessment and Care  - Incontinence Assessment and Management 0  - Ostomy Care Assessment and Management (repouching, etc.) 0 PROCESS - Coordination of Care X - Simple Patient / Family Education for ongoing care 1 15  - Complex (extensive) Patient / Family Education for ongoing care 0  - Staff obtains Chiropractor, Records, Test Results / Process Orders 0  - Staff telephones HHA, Nursing Homes / Clarify orders / etc 0  - Routine Transfer to another Facility (non-emergent condition) 0 Ace, [name] ([id number])  - Routine Hospital Admission (non-emergent condition) 0  - New Admissions / Manufacturing engineer / Ordering NPWT, Apligraf, etc. 0  - Emergency Hospital Admission (emergent condition) 0 X - Simple Discharge Coordination 1 10  - Complex (extensive) Discharge Coordination 0 PROCESS - Special Needs  - Pediatric / Minor Patient Management 0  - Isolation Patient Management 0  - Hearing / Language / Visual special needs 0  - Assessment of Community assistance (transportation, D/C planning, etc.) 0  - Additional assistance / Altered mentation 0  - Support Surface(s) Assessment (bed, cushion, seat, etc.) 0 INTERVENTIONS - Wound Cleansing /  Measurement X - Simple Wound Cleansing - one wound 1 5  - Complex Wound Cleansing - multiple wounds 0 X -  Wound Imaging (photographs - any number of wounds) 1 5 []  - Wound Tracing (instead of photographs) 0 X - Simple Wound Measurement - one wound 1 5 []  - Complex Wound Measurement - multiple wounds 0 INTERVENTIONS - Wound Dressings X - Small Wound Dressing one or multiple wounds 1 10 []  - Medium Wound Dressing one or multiple wounds 0 []  - Large Wound Dressing one or multiple wounds 0 []  - Application of Medications - topical 0 []  - Application of Medications - injection 0 INTERVENTIONS - Miscellaneous []  - External ear exam 0 [name], [name] ([id number]) []  - Specimen Collection (cultures, biopsies, blood, body fluids, etc.) 0 []  - Specimen(s) / Culture(s) sent or taken to Lab for analysis 0 []  - Patient Transfer (multiple staff / Michiel Sites Lift / Similar devices) 0 []  - Simple Staple / Suture removal (25 or less) 0 []  - Complex Staple / Suture removal (26 or more) 0 []  - Hypo / Hyperglycemic Management (close monitor of Blood Glucose) 0 []  - Ankle / Brachial Index (ABI) - do not check if billed separately 0 X - Vital Signs 1 5 Has the patient been seen at the hospital within the last three years: Yes Total Score: 80 Level Of Care: New/Established - Level 3 Electronic Signature(s) Signed: 05/10/2015 3:16:53 PM By: [name] BSN, RN Entered By: [name] on 05/10/2015 13:35:02 [name] ([id number]) -------------------------------------------------------------------------------- Encounter Discharge Information Details Patient Name: [name] A. Date of Service: 05/10/2015 1:00 PM Medical Record Number: [id number] Patient Account Number: [id number] Date of Birth/Sex: 1941/03/01 (73 y.o. Female) Treating RN: [name], [place] [name] Primary Care Physician: SYSTEM, PCP Other Clinician: Referring Physician: Lorie [name] Treating Physician/Extender: [name] in  Treatment: 7 Encounter Discharge Information Items Discharge Pain Level: 0 Discharge Condition: Stable Ambulatory Status: Ambulatory Discharge Destination: Home Transportation: Private Auto Accompanied By: self Schedule Follow-up Appointment: No Medication Reconciliation completed and provided to Patient/Care No Julian Askin: Provided on Clinical Summary of Care: 05/10/2015 Form Type Recipient Paper Patient GG Electronic Signature(s) Signed: 05/10/2015 1:49:54 PM By: Gwenlyn Perking Previous Signature: 05/10/2015 1:47:29 PM Version By: [name] BSN, RN Entered By: Gwenlyn Perking on 05/10/2015 13:49:53 [name] ([id number]) -------------------------------------------------------------------------------- Lower Extremity Assessment Details Patient Name: [name] A. Date of Service: 05/10/2015 1:00 PM Medical Record Number: [id number] Patient Account Number: [id number] Date of Birth/Sex: [birth year] (73 y.o. Female) Treating RN: [name], [place] [name] Primary Care Physician: SYSTEM, PCP Other Clinician: Referring Physician: Lorie [name] Treating Physician/Extender: [name] in Treatment: 7 Vascular Assessment Pulses: Posterior Tibial Dorsalis Pedis Palpable: [Left:Yes] Extremity colors, hair growth, and conditions: Extremity Color: [Left:Normal] Hair Growth on Extremity: [Left:No] Temperature of Extremity: [Left:Warm] Capillary Refill: [Left:< 3 seconds] Toe Nail Assessment Left: Right: Thick: Yes Discolored: Yes Deformed: No Improper Length and Hygiene: Yes Electronic Signature(s) Signed: 05/10/2015 3:16:53 PM By: [name] BSN, RN Entered By: [name] on 05/10/2015 13:28:50 [name] A. ([id number]) -------------------------------------------------------------------------------- Multi Wound Chart Details Patient Name: [name] A. Date of Service: 05/10/2015 1:00 PM Medical Record Number: [id number] Patient Account Number: [id number] Date of  Birth/Sex: 02/25/1941 (73 y.o. Female) Treating RN: [name], [place] [name] Primary Care Physician: SYSTEM, PCP Other Clinician: Referring Physician: Lorie [name] Treating Physician/Extender: [name] in Treatment: 7 Vital Signs Height(in): 62 Pulse(bpm): 70 Weight(lbs): 250 Blood Pressure 142/70 (mmHg): Body Mass Index(BMI): 46 Temperature(F): 99.1 Respiratory Rate 20 (breaths/min): Photos: [1:No Photos] [N/A:N/A] Wound Location: [1:Left Calcaneous] [N/A:N/A] Wounding Event: [1:Pressure Injury] [N/A:N/A] Primary Etiology: [1:Pressure Ulcer] [N/A:N/A]  Comorbid History: [1:Anemia, Chronic Obstructive Pulmonary Disease (COPD), Sleep Apnea, Arrhythmia, Congestive Heart Failure, Coronary Artery Disease, Hypertension, Peripheral Venous Disease, Type II Diabetes, Osteoarthritis, Neuropathy] [N/A:N/A] Date Acquired: [1:02/12/2015] [N/A:N/A] Weeks of Treatment: [1:7] [N/A:N/A] Wound Status: [1:Open] [N/A:N/A] Measurements L x W x D 0.4x0.8x0.8 [N/A:N/A] (cm) Area (cm) : [1:0.251] [N/A:N/A] Volume (cm) : [1:0.201] [N/A:N/A] % Reduction in Area: [1:96.50%] [N/A:N/A] % Reduction in Volume: 71.90% [N/A:N/A] Classification: [1:Category/Stage III] [N/A:N/A] HBO Classification: [1:Grade 2] [N/A:N/A] Exudate Amount: [1:Medium] [N/A:N/A] Exudate Type: [1:Serous] [N/A:N/A] Exudate Color: [1:amber] [N/A:N/A] Foul Odor After [1:Yes] [N/A:N/A] Cleansing: [name], [name] ([id number]) Odor Anticipated Due to No N/A N/A Product Use: Wound Margin: Flat and Intact N/A N/A Granulation Amount: Large (67-100%) N/A N/A Granulation Quality: Pink N/A N/A Necrotic Amount: None Present (0%) N/A N/A Exposed Structures: Fascia: No N/A N/A Fat: No Tendon: No Muscle: No Joint: No Bone: No Limited to Skin Breakdown Epithelialization: Small (1-33%) N/A N/A Periwound Skin Texture: Callus: Yes N/A N/A Edema: No Excoriation: No Induration: No Crepitus: No Fluctuance: No Friable:  No Rash: No Scarring: No Periwound Skin Moist: Yes N/A N/A Moisture: Maceration: No Dry/Scaly: No Periwound Skin Color: Atrophie Blanche: No N/A N/A Cyanosis: No Ecchymosis: No Erythema: No Hemosiderin Staining: No Mottled: No Pallor: No Rubor: No Temperature: No Abnormality N/A N/A Tenderness on No N/A N/A Palpation: Wound Preparation: Ulcer Cleansing: N/A N/A Rinsed/Irrigated with Saline Topical Anesthetic Applied: Other: lidocaine 4% Treatment Notes Electronic Signature(s) [name], [name] ([id number]) Signed: 05/10/2015 1:31:24 PM By: [name] BSN, RN Entered By: [name] on 05/10/2015 13:31:24 [name] ([id number]) -------------------------------------------------------------------------------- Multi-Disciplinary Care Plan Details Patient Name: [name] A. Date of Service: 05/10/2015 1:00 PM Medical Record Number: [id number] Patient Account Number: [id number] Date of Birth/Sex: [birth year] (73 y.o. Female) Treating RN: [name], [place] [name] Primary Care Physician: SYSTEM, PCP Other Clinician: Referring Physician: Lorie [name] Treating Physician/Extender: [name] in Treatment: 7 Active Inactive Abuse / Safety / Falls / Self Care Management Nursing Diagnoses: Potential for falls Goals: Patient will remain injury free Date Initiated: 03/16/2015 Goal Status: Active Interventions: Assess fall risk on admission and as needed Notes: Orientation to the Wound Care Program Nursing Diagnoses: Knowledge deficit related to the wound healing center program Goals: Patient/caregiver will verbalize understanding of the Wound Healing Center Program Date Initiated: 03/16/2015 Goal Status: Active Interventions: Provide education on orientation to the wound center Notes: Pressure Nursing Diagnoses: Potential for impaired tissue integrity related to pressure, friction, moisture, and shear Goals: Patient will remain free from development of  additional pressure ulcers Date Initiated: 03/16/2015 [name] ([id number]) Goal Status: Active Interventions: Assess potential for pressure ulcer upon admission and as needed Notes: Wound/Skin Impairment Nursing Diagnoses: Impaired tissue integrity Goals: Ulcer/skin breakdown will have a volume reduction of 30% by week 4 Date Initiated: 03/16/2015 Goal Status: Active Interventions: Assess patient/caregiver ability to perform ulcer/skin care regimen upon admission and as needed Notes: Electronic Signature(s) Signed: 05/10/2015 1:31:14 PM By: [name] BSN, RN Entered By: [name] on 05/10/2015 13:31:13 [name] A. ([id number]) -------------------------------------------------------------------------------- Pain Assessment Details Patient Name: [name] A. Date of Service: 05/10/2015 1:00 PM Medical Record Number: [id number] Patient Account Number: [id number] Date of Birth/Sex: [birth year] (73 y.o. Female) Treating RN: [name], [place] [name] Primary Care Physician: SYSTEM, PCP Other Clinician: Referring Physician: Lorie [name] Treating Physician/Extender: [name] in Treatment: 7 Active Problems Location of Pain Severity and Description of Pain Patient Has Paino No Site Locations Pain Management and Medication Current Pain  Management: Electronic Signature(s) Signed: 05/10/2015 3:16:53 PM By: [name] BSN, RN Entered By: [name] on 05/10/2015 13:21:16 [name] ([id number]) -------------------------------------------------------------------------------- Patient/Caregiver Education Details Patient Name: [name] A. Date of Service: 05/10/2015 1:00 PM Medical Record Number: [id number] Patient Account Number: [id number] Date of Birth/Gender: 1941/08/24 (73 y.o. Female) Treating RN: [name],  [name] Primary Care Physician: SYSTEM, PCP Other Clinician: Referring Physician: Lorie [name] Treating Physician/Extender: [name] in Treatment: 7 Education Assessment Education Provided To: Patient Education Topics Provided Basic Hygiene: Methods: Explain/Verbal Responses: State content correctly Welcome To The Wound Care Center: Methods: Explain/Verbal Responses: State content correctly Electronic Signature(s) Signed: 05/10/2015 1:47:44 PM By: [name] BSN, RN Entered By: [name] on 05/10/2015 13:47:44 [name] ([id number]) -------------------------------------------------------------------------------- Wound Assessment Details Patient Name: [name] A. Date of Service: 05/10/2015 1:00 PM Medical Record Number: [id number] Patient Account Number: [id number] Date of Birth/Sex: 1941/05/02 (73 y.o. Female) Treating RN: [name], [place] Primary Care Physician: SYSTEM, PCP Other Clinician: Referring Physician: Lorie [name] Treating Physician/Extender: [name] in Treatment: 7 Wound Status Wound Number: 1 Primary Pressure Ulcer Etiology: Wound Location: Left Calcaneous Wound Open Wounding Event: Pressure Injury Status: Date Acquired: 02/12/2015 Comorbid Anemia, Chronic Obstructive Pulmonary Weeks Of Treatment: 7 History: Disease (COPD), Sleep Apnea, Clustered Wound: No Arrhythmia, Congestive Heart Failure, Coronary Artery Disease, Hypertension, Peripheral Venous Disease, Type II Diabetes, Osteoarthritis, Neuropathy Photos Photo Uploaded By: [name] on 05/10/2015 15:08:28 Wound Measurements Length: (cm) 0.4 Width: (cm) 0.8 Depth: (cm) 0.8 Area: (cm) 0.251 Volume: (cm) 0.201 % Reduction in Area: 96.5% % Reduction in Volume: 71.9% Epithelialization: Small (1-33%) Tunneling: No Undermining: No Wound Description Classification: Category/Stage III Foul Odor Af Diabetic Severity (Wagner): Grade 2 Due to Produ Wound Margin: Flat and Intact Exudate Amount: Medium Exudate Type: Serous Exudate Color: amber ter Cleansing: Yes ct Use: No Wound  Bed [name], [name] ([id number]) Granulation Amount: Large (67-100%) Exposed Structure Granulation Quality: Pink Fascia Exposed: No Necrotic Amount: None Present (0%) Fat Layer Exposed: No Tendon Exposed: No Muscle Exposed: No Joint Exposed: No Bone Exposed: No Limited to Skin Breakdown Periwound Skin Texture Texture Color No Abnormalities Noted: No No Abnormalities Noted: No Callus: Yes Atrophie Blanche: No Crepitus: No Cyanosis: No Excoriation: No Ecchymosis: No Fluctuance: No Erythema: No Friable: No Hemosiderin Staining: No Induration: No Mottled: No Localized Edema: No Pallor: No Rash: No Rubor: No Scarring: No Temperature / Pain Moisture Temperature: No Abnormality No Abnormalities Noted: No Dry / Scaly: No Maceration: No Moist: Yes Wound Preparation Ulcer Cleansing: Rinsed/Irrigated with Saline Topical Anesthetic Applied: Other: lidocaine 4%, Treatment Notes Wound #1 (Left Calcaneous) 1. Cleansed with: Clean wound with Normal Saline 4. Dressing Applied: Prisma Ag 5. Secondary Dressing Applied Gauze and Kerlix/Conform 7. Secured with Tape Notes stretch netting Electronic Signature(s) Signed: 05/10/2015 3:16:53 PM By: [name] BSN, RN [name], [name] ([id number]) Entered By: [name] on 05/10/2015 13:33:32 [name] ([id number]) -------------------------------------------------------------------------------- Vitals Details Patient Name: [name] A. Date of Service: 05/10/2015 1:00 PM Medical Record Number: [id number] Patient Account Number: [id number] Date of Birth/Sex: 1941-03-20 (73 y.o. Female) Treating RN: [name], [place] Primary Care Physician: SYSTEM, PCP Other Clinician: Referring Physician: Lorie [name] Treating Physician/Extender: [name] in Treatment: 7 Vital Signs Time Taken: 13:21 Temperature (F): 99.1 Height (in): 62 Pulse (bpm): 70 Weight (lbs): 250 Respiratory Rate (breaths/min): 20 Body  Mass Index (BMI): 45.7 Blood Pressure (mmHg): 142/70 Reference Range: 80 - 120 mg / dl Electronic Signature(s)  Signed: 05/10/2015 3:16:53 PM By: [name] BSN, RN Entered By: [name] on 05/10/2015 13:21:39

## 2015-05-17 ENCOUNTER — Encounter: Payer: Medicare Other | Admitting: Surgery

## 2015-05-17 DIAGNOSIS — L89623 Pressure ulcer of left heel, stage 3: Secondary | ICD-10-CM | POA: Diagnosis not present

## 2015-05-18 NOTE — Progress Notes (Signed)
[name], [name] ([id number]) Visit Report for 05/17/2015 Chief Complaint Document Details Patient Name: [name], [name] Date of Service: 05/17/2015 2:30 PM Medical Record Patient Account Number: [id number] Number: [name], RN, BSN, Treating RN: 07/19/1941 (74 y.o. [place] S[place] Date of Birth/Sex: Female) Other Clinician: Primary Care Physician: SYSTEM, PCP Treating Evlyn Kanner Referring Physician: Lorie Phenix Physician/Extender: Weeks in Treatment: 8 Information Obtained from: Patient Chief Complaint Patient presents to the wound care center for a consult due non healing wound. 74 year old patient who comes with the chief complaints of having a ulcerated area on her left heel for about a month. Electronic Signature(s) Signed: 05/17/2015 3:13:37 PM By: Evlyn Kanner MD, FACS Entered By: Evlyn Kanner on 05/17/2015 15:13:37 [name] ([id number]) -------------------------------------------------------------------------------- HPI Details Patient Name: Traci Iha A. Date of Service: 05/17/2015 2:30 PM Medical Record Patient Account Number: [id number] Number: [name], RN, BSN, Treating RN: 1941-07-22 (74 y.o. [place] Sink Date of Birth/Sex: Female) Other Clinician: Primary Care Physician: SYSTEM, PCP Treating Evlyn Kanner Referring Physician: Lorie Phenix Physician/Extender: Weeks in Treatment: 8 History of Present Illness Location: bilateral pressure injuries to both heels, left more than right Quality: Patient reports No Pain. Severity: Patient states wound are getting worse. Duration: Patient has had the wound for > 1 months prior to seeking treatment at the wound center Context: The wound appeared gradually over time Modifying Factors: Consults to this date include:some local care as per the nursing home staff Associated Signs and Symptoms: Patient reports having:no sensation on her feet whatsoever HPI Description: 74 year old patient who is known to have  several comorbidities including Diabetes mellitus, opoid dependence, cervical spondylosis, GERD, anemia, dermatitis, general debility and pressure ulcers of both heels is here for an opinion regarding the latter. She does not check her blood sugar every day and does not know the last blood values which were done about 2 weeks ago. she has been a smoker all her life and was smoking up to a pack and a half a day but now smokes 1 packet in 3 days. PAST MEDICAL HISTORY: DM type 2, COPD, OSA, hypothyroidism, hypertension, diabetes, hyperlipidemia, CHF, morbid obesity, previous history of tobacco abuse, coronary artery disease, paroxysmal atrial fibrillation on Xarelto. 04/23/2015 -- he has been off loading well but still continues to smoke about 6 cigarettes a day. No other fresh issues. Electronic Signature(s) Signed: 05/17/2015 3:13:44 PM By: Evlyn Kanner MD, FACS Entered By: Evlyn Kanner on 05/17/2015 15:13:44 [name] ([id number]) -------------------------------------------------------------------------------- Physical Exam Details Patient Name: Traci Iha A. Date of Service: 05/17/2015 2:30 PM Medical Record Patient Account Number: [id number] Number: [name], RN, BSN, Treating RN: June 10, 1941 (74 y.o. [place] Sink Date of Birth/Sex: Female) Other Clinician: Primary Care Physician: SYSTEM, PCP Treating Evlyn Kanner Referring Physician: Lorie Phenix Physician/Extender: Weeks in Treatment: 8 Constitutional . Pulse regular. Respirations normal and unlabored. Afebrile. . Eyes Nonicteric. Reactive to light. Ears, Nose, Mouth, and Throat Lips, teeth, and gums WNL.Marland Kitchen Moist mucosa without lesions . Neck supple and nontender. No palpable supraclavicular or cervical adenopathy. Normal sized without goiter. Respiratory WNL. No retractions.. Cardiovascular Pedal Pulses WNL. No clubbing, cyanosis or edema. Chest Breasts symmetical and no nipple discharge.. Breast tissue WNL,  no masses, lumps, or tenderness.. Lymphatic No adneopathy. No adenopathy. No adenopathy. Musculoskeletal Adexa without tenderness or enlargement.. Digits and nails w/o clubbing, cyanosis, infection, petechiae, ischemia, or inflammatory conditions.. Integumentary (Hair, Skin) No suspicious lesions. No crepitus or fluctuance. No peri-wound warmth or erythema. No masses.Marland Kitchen Psychiatric Judgement and insight Intact.. No evidence of depression,  anxiety, or agitation.. Notes the wound had a thick eschar over it and once this was gently removed there was a very tiny small open area about 3-4 mm in size. Electronic Signature(s) Signed: 05/17/2015 3:14:29 PM By: Evlyn Kanner MD, FACS Entered By: Evlyn Kanner on 05/17/2015 15:14:29 [name] ([id number]) -------------------------------------------------------------------------------- Physician Orders Details Patient Name: Traci Iha A. Date of Service: 05/17/2015 2:30 PM Medical Record Patient Account Number: [id number] Number: [name], RN, BSN, Treating RN: [birth year] (74 y.o. [place] Sink Date of Birth/Sex: Female) Other Clinician: Primary Care Physician: SYSTEM, PCP Treating Evlyn Kanner Referring Physician: Lorie Phenix Physician/Extender: Weeks in Treatment: 8 Verbal / Phone Orders: Yes Clinician: [name], RN, BSN, Rita Read Back and Verified: Yes Diagnosis Coding Primary Wound Dressing o Mepitel One Secondary Dressing Wound #1 Left Calcaneous o Boardered Foam Dressing Dressing Change Frequency Wound #1 Left Calcaneous o Other: - keep dressing in place till next week wound care check Follow-up Appointments Wound #1 Left Calcaneous o Return Appointment in 1 week. Off-Loading Wound #1 Left Calcaneous o Other: - sage boots Electronic Signature(s) Signed: 05/17/2015 3:03:35 PM By: Elpidio Eric BSN, RN Signed: 05/17/2015 4:11:41 PM By: Evlyn Kanner MD, FACS Entered By: Elpidio Eric on 05/17/2015  15:03:35 [name] ([id number]) -------------------------------------------------------------------------------- Problem List Details Patient Name: Traci Iha A. Date of Service: 05/17/2015 2:30 PM Medical Record Patient Account Number: [id number] Number: [name], RN, BSN, Treating RN: [birth year] (74 y.o. 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 Date of Service: 05/17/2015 2:30 PM Medical Record Patient Account Number: [id number] Number: [name], RN, BSN, Treating RN: [birth year] (75 y.o.  Sink Date of Birth/Sex: Female) Other Clinician: Primary Care Physician: SYSTEM, PCP Treating Evlyn Kanner Referring Physician: Lorie Phenix Physician/Extender: Weeks in Treatment: 8 Subjective Chief Complaint Information obtained from Patient Patient presents to the wound care center for a consult due non healing wound. 74 year old patient who comes with the chief complaints of having a ulcerated area on her left heel for about a month. History of  Present Illness (HPI) The following HPI elements were documented for the patient's wound: Location: bilateral pressure injuries to both heels, left more than right Quality: Patient reports No Pain. Severity: Patient states wound are getting worse. Duration: Patient has had the wound for > 1 months prior to seeking treatment at the wound center Context: The wound appeared gradually over time Modifying Factors: Consults to this date include:some local care as per the nursing home staff Associated Signs and Symptoms: Patient reports having:no sensation on her feet whatsoever 74 year old patient who is known to have several comorbidities including Diabetes mellitus, opoid dependence, cervical spondylosis, GERD, anemia, dermatitis, general debility and pressure ulcers of both heels is here for an opinion regarding the latter. She does not check her blood sugar every day and does not know the last blood values which were  done about 2 weeks ago. she has been a smoker all her life and was smoking up to a pack and a half a day but now smokes 1 packet in 3 days. PAST MEDICAL HISTORY: DM type 2, COPD, OSA, hypothyroidism, hypertension, diabetes, hyperlipidemia, CHF, morbid obesity, previous history of tobacco abuse, coronary artery disease, paroxysmal atrial fibrillation on Xarelto. 04/23/2015 -- he has been off loading well but still continues to smoke about 6 cigarettes a day. No other fresh issues. Objective [name], [name] ([id number]) Constitutional Pulse regular. Respirations normal and unlabored. Afebrile. Vitals Time Taken: 2:41 PM, Height: 62 in, Weight: 250 lbs, BMI: 45.7, Temperature: 98.9 F, Pulse: 78 bpm, Respiratory Rate: 22 breaths/min, Blood Pressure: 103/60 mmHg. Eyes Nonicteric. Reactive to light. Ears, Nose, Mouth, and Throat Lips, teeth, and gums WNL.Marland Kitchen Moist mucosa without lesions . Neck supple and nontender. No palpable supraclavicular or cervical adenopathy. Normal sized  without goiter. Respiratory WNL. No retractions.. Cardiovascular Pedal Pulses WNL. No clubbing, cyanosis or edema. Chest Breasts symmetical and no nipple discharge.. Breast tissue WNL, no masses, lumps, or tenderness.. Lymphatic No adneopathy. No adenopathy. No adenopathy. Musculoskeletal Adexa without tenderness or enlargement.. Digits and nails w/o clubbing, cyanosis, infection, petechiae, ischemia, or inflammatory conditions.Marland Kitchen Psychiatric Judgement and insight Intact.. No evidence of depression, anxiety, or agitation.. General Notes: the wound had a thick eschar over it and once this was gently removed there was a very tiny small open area about 3-4 mm in size. Integumentary (Hair, Skin) No suspicious lesions. No crepitus or fluctuance. No peri-wound warmth or erythema. No masses.. Wound #1 status is Open. Original cause of wound was Pressure Injury. The wound is located on the Left Calcaneous. The wound measures 0.2cm length x 0.2cm width x 0.2cm depth; 0.031cm^2 area and 0.006cm^3 volume. The wound is limited to skin breakdown. There is no tunneling or undermining noted. There is a none present amount of drainage noted. The wound margin is flat and intact. There is large (67- 100%) pink granulation within the wound bed. There is no necrotic tissue within the wound bed. The periwound skin appearance exhibited: Callus. The periwound skin appearance did not exhibit: Crepitus, Excoriation, Fluctuance, Friable, Induration, Localized Edema, Rash, Scarring, Dry/Scaly, Maceration, Moist, Atrophie Blanche, Cyanosis, Ecchymosis, Hemosiderin Staining, Mottled, Pallor, Rubor, Erythema. [name], [name] ([id number]) Periwound temperature was noted as No Abnormality. Assessment Active Problems ICD-10 E11.621 - Type 2 diabetes mellitus with foot ulcer E08.42 - Diabetes mellitus due to underlying condition with diabetic polyneuropathy L89.623 - Pressure ulcer of left heel, stage 3 E66.01 -  Morbid (severe) obesity due to excess calories F17.218 - Nicotine dependence, cigarettes, with other nicotine-induced disorders Since there is a tiny area open under the eschar I have recommended a nonadherent dressing with Mepitel and a bordered foam over this and we will leave it intact for a week. I anticipate it'll be healed next week. Plan Primary Wound Dressing: Mepitel One Secondary Dressing: Wound #1 Left Calcaneous: Boardered Foam Dressing Dressing Change Frequency: Wound #1 Left Calcaneous: Other: - keep dressing in place till next week wound care check Follow-up Appointments: Wound #1 Left Calcaneous: Return Appointment in 1 week. Off-Loading: Wound #1 Left Calcaneous: Other: - sage boots Since there is a tiny area open under the eschar I have recommended a nonadherent dressing with Mepitel and a bordered foam over this and we will leave it intact for a week. I anticipate it'll be healed next week. [name], [name] ([id number]) Electronic Signature(s) Signed: 05/17/2015 3:15:13 PM By: Evlyn Kanner  MD, FACS Entered By: Evlyn Kanner on 05/17/2015 15:15:12 [name] ([id number]) -------------------------------------------------------------------------------- SuperBill Details Patient Name: Traci Iha A. Date of Service: 05/17/2015 Medical Record Patient Account Number: [id number] Number: [name], RN, BSN, Treating RN: [birth year] (75 y.o.  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 L89.623 Pressure ulcer of left heel, stage 3 E66.01 Morbid (severe) obesity due to excess calories F17.218 Nicotine dependence, cigarettes, with other  nicotine-induced disorders Physician Procedures CPT4 Code Description: [id number] - WC PHYS LEVEL 3 - EST PT ICD-10 Description Diagnosis E11.621 Type 2 diabetes mellitus with foot ulcer E08.42 Diabetes mellitus due to underlying condition with L89.623 Pressure ulcer of left heel, stage 3 Modifier: diabetic polyn Quantity: 1 europathy Electronic Signature(s) Signed: 05/17/2015 3:15:28 PM By: Evlyn Kanner MD, FACS Entered By: Evlyn Kanner on 05/17/2015 15:15:28

## 2015-05-18 NOTE — Progress Notes (Addendum)
ABIGAIL, TEALL ([id number]) Visit Report for 05/17/2015 Arrival Information Details Patient Name: [name], [name] Date of Service: 05/17/2015 2:30 PM Medical Record Number: [id number] Patient Account Number: [id number] Date of Birth/Sex: [birth year] (73 y.o. Female) Treating RN: [name], RN, BSN, [place] Sink Primary Care Physician: SYSTEM, PCP Other Clinician: Referring Physician: Lorie Phenix Treating Physician/Extender: Rudene Re in Treatment: 8 Visit Information History Since Last Visit Added or deleted any medications: No Patient Arrived: Wheel Chair Any new allergies or adverse reactions: No Arrival Time: 14:29 Had a fall or experienced change in Yes Accompanied By: self activities of daily living that may affect Transfer Assistance: Other risk of falls: Patient Identification Verified: Yes Signs or symptoms of abuse/neglect since last No Secondary Verification Process Yes visito Completed: Hospitalized since last visit: No Patient Has Alerts: Yes Pain Present Now: No Patient Alerts: Patient on Blood Thinner DMII xarelto, asa Notes four people lift. Patient will be a hoyer lift next visit Electronic Signature(s) Signed: 05/17/2015 2:37:22 PM By: Elpidio Eric BSN, RN Previous Signature: 05/17/2015 2:37:05 PM Version By: Elpidio Eric BSN, RN Previous Signature: 05/17/2015 2:31:59 PM Version By: Elpidio Eric BSN, RN Entered By: Elpidio Eric on 05/17/2015 14:37:22 [name] ([id number]) -------------------------------------------------------------------------------- Clinic Level of Care Assessment Details Patient Name: Traci Iha A. Date of Service: 05/17/2015 2:30 PM Medical Record Number: [id number] Patient Account Number: [id number] Date of Birth/Sex: 10-25-1940 (73 y.o. Female) Treating RN: [name], RN, BSN, [place] Primary Care Physician: SYSTEM, PCP Other Clinician: Referring Physician: Lorie Phenix Treating Physician/Extender: Rudene Re in  Treatment: 8 Clinic Level of Care Assessment Items TOOL 4 Quantity Score []  - Use when only an EandM is performed on FOLLOW-UP visit 0 ASSESSMENTS - Nursing Assessment / Reassessment X - Reassessment of Co-morbidities (includes updates in patient status) 1 10 X - Reassessment of Adherence to Treatment Plan 1 5 ASSESSMENTS - Wound and Skin Assessment / Reassessment X - Simple Wound Assessment / Reassessment - one wound 1 5 []  - Complex Wound Assessment / Reassessment - multiple wounds 0 []  - Dermatologic / Skin Assessment (not related to wound area) 0 ASSESSMENTS - Focused Assessment []  - Circumferential Edema Measurements - multi extremities 0 []  - Nutritional Assessment / Counseling / Intervention 0 X - Lower Extremity Assessment (monofilament, tuning fork, pulses) 1 5 []  - Peripheral Arterial Disease Assessment (using hand held doppler) 0 ASSESSMENTS - Ostomy and/or Continence Assessment and Care []  - Incontinence Assessment and Management 0 []  - Ostomy Care Assessment and Management (repouching, etc.) 0 PROCESS - Coordination of Care X - Simple Patient / Family Education for ongoing care 1 15 []  - Complex (extensive) Patient / Family Education for ongoing care 0 []  - Staff obtains Chiropractor, Records, Test Results / Process Orders 0 X - Staff telephones HHA, Nursing Homes / Clarify orders / etc 1 10 []  - Routine Transfer to another Facility (non-emergent condition) 0 [name], [name] ([id number]) []  - Routine Hospital Admission (non-emergent condition) 0 []  - New Admissions / Manufacturing engineer / Ordering NPWT, Apligraf, etc. 0 []  - Emergency Hospital Admission (emergent condition) 0 X - Simple Discharge Coordination 1 10 []  - Complex (extensive) Discharge Coordination 0 PROCESS - Special Needs []  - Pediatric / Minor Patient Management 0 []  - Isolation Patient Management 0 []  - Hearing / Language / Visual special needs 0 []  - Assessment of Community assistance  (transportation, D/C planning, etc.) 0 []  - Additional assistance / Altered mentation 0 []  - Support Surface(s) Assessment (bed, cushion, seat, etc.) 0  INTERVENTIONS - Wound Cleansing / Measurement X - Simple Wound Cleansing - one wound 1 5  - Complex Wound Cleansing - multiple wounds 0 X - Wound Imaging (photographs - any number of wounds) 1 5  - Wound Tracing (instead of photographs) 0 X - Simple Wound Measurement - one wound 1 5  - Complex Wound Measurement - multiple wounds 0 INTERVENTIONS - Wound Dressings X - Small Wound Dressing one or multiple wounds 1 10  - Medium Wound Dressing one or multiple wounds 0  - Large Wound Dressing one or multiple wounds 0  - Application of Medications - topical 0  - Application of Medications - injection 0 INTERVENTIONS - Miscellaneous  - External ear exam 0 [name], [name] ([id number])  - Specimen Collection (cultures, biopsies, blood, body fluids, etc.) 0  - Specimen(s) / Culture(s) sent or taken to Lab for analysis 0  - Patient Transfer (multiple staff / Michiel Sites Lift / Similar devices) 0  - Simple Staple / Suture removal (25 or less) 0  - Complex Staple / Suture removal (26 or more) 0  - Hypo / Hyperglycemic Management (close monitor of Blood Glucose) 0  - Ankle / Brachial Index (ABI) - do not check if billed separately 0 X - Vital Signs 1 5 Has the patient been seen at the hospital within the last three years: Yes Total Score: 90 Level Of Care: New/Established - Level 3 Electronic Signature(s) Signed: 05/22/2015 10:21:40 AM By: Elpidio Eric BSN, RN Entered By: Elpidio Eric on 05/22/2015 10:21:40 [name] ([id number]) -------------------------------------------------------------------------------- Encounter Discharge Information Details Patient Name: Traci Iha A. Date of Service: 05/17/2015 2:30 PM Medical Record Number: [id number] Patient Account Number: [id number] Date of Birth/Sex: Apr 21, 1941 (73 y.o.  Female) Treating RN: Clover Mealy, RN, BSN, [place] Sink Primary Care Physician: SYSTEM, PCP Other Clinician: Referring Physician: Lorie Phenix Treating Physician/Extender: Rudene Re in Treatment: 8 Encounter Discharge Information Items Discharge Pain Level: 0 Discharge Condition: Stable Ambulatory Status: Wheelchair Discharge Destination: Nursing Home Transportation: Other Accompanied By: self Schedule Follow-up Appointment: No Medication Reconciliation completed and provided to Patient/Care No [name]: Provided on Clinical Summary of Care: 05/17/2015 Form Type Recipient Paper Patient GG Electronic Signature(s) Signed: 05/22/2015 10:24:07 AM By: Elpidio Eric BSN, RN Previous Signature: 05/17/2015 3:09:55 PM Version By: Gwenlyn Perking Entered By: Elpidio Eric on 05/22/2015 10:24:07 [name] ([id number]) -------------------------------------------------------------------------------- Lower Extremity Assessment Details Patient Name: Traci Iha A. Date of Service: 05/17/2015 2:30 PM Medical Record Number: [id number] Patient Account Number: [id number] Date of Birth/Sex: Jun 17, 1941 (73 y.o. Female) Treating RN: [name], RN, BSN, [place] Sink Primary Care Physician: SYSTEM, PCP Other Clinician: Referring Physician: Lorie Phenix Treating Physician/Extender: Rudene Re in Treatment: 8 Vascular Assessment Pulses: Posterior Tibial Dorsalis Pedis Palpable: [Left:Yes] Extremity colors, hair growth, and conditions: Extremity Color: [Left:Mottled] Hair Growth on Extremity: [Left:No] Temperature of Extremity: [Left:Warm] Capillary Refill: [Left:< 3 seconds] Toe Nail Assessment Left: Right: Thick: Yes Discolored: Yes Deformed: No Improper Length and Hygiene: Yes Electronic Signature(s) Signed: 05/17/2015 2:39:35 PM By: Elpidio Eric BSN, RN Entered By: Elpidio Eric on 05/17/2015 14:39:35 Traci Iha A.  ([id number]) -------------------------------------------------------------------------------- Multi Wound Chart Details Patient Name: Traci Iha A. Date of Service: 05/17/2015 2:30 PM Medical Record Number: [id number] Patient Account Number: [id number] Date of Birth/Sex: 1941-07-09 (73 y.o. Female) Treating RN: Clover Mealy, RN, BSN, [place] Sink Primary Care Physician: SYSTEM, PCP Other Clinician: Referring Physician: Lorie Phenix Treating Physician/Extender: Rudene Re in Treatment: 8 Vital Signs Height(in): 62 Pulse(bpm): 78 Weight(lbs): 250 Blood Pressure 103/60 (mmHg): Body Mass  Index(BMI): 46 Temperature(F): 98.9 Respiratory Rate 22 (breaths/min): Photos: [1:No Photos] [N/A:N/A] Wound Location: [1:Left Calcaneous] [N/A:N/A] Wounding Event: [1:Pressure Injury] [N/A:N/A] Primary Etiology: [1:Pressure Ulcer] [N/A:N/A] Comorbid History: [1:Anemia, Chronic Obstructive Pulmonary Disease (COPD), Sleep Apnea, Arrhythmia, Congestive Heart Failure, Coronary Artery Disease, Hypertension, Peripheral Venous Disease, Type II Diabetes, Osteoarthritis, Neuropathy] [N/A:N/A] Date Acquired: [1:02/12/2015] [N/A:N/A] Weeks of Treatment: [1:8] [N/A:N/A] Wound Status: [1:Open] [N/A:N/A] Measurements L x W x D 0x0x0 [N/A:N/A] (cm) Area (cm) : [1:0] [N/A:N/A] Volume (cm) : [1:0] [N/A:N/A] % Reduction in Area: [1:100.00%] [N/A:N/A] % Reduction in Volume: 100.00% [N/A:N/A] Classification: [1:Category/Stage III] [N/A:N/A] HBO Classification: [1:Grade 2] [N/A:N/A] Exudate Amount: [1:None Present] [N/A:N/A] Foul Odor After [1:Yes] [N/A:N/A] Cleansing: Odor Anticipated Due to No [N/A:N/A] Product Use: [name], [name] ([id number]) Wound Margin: Flat and Intact N/A N/A Granulation Amount: Large (67-100%) N/A N/A Granulation Quality: Pink N/A N/A Necrotic Amount: None Present (0%) N/A N/A Exposed Structures: Fascia: No N/A N/A Fat: No Tendon: No Muscle: No Joint: No Bone: No Limited to  Skin Breakdown Epithelialization: Large (67-100%) N/A N/A Periwound Skin Texture: Callus: Yes N/A N/A Edema: No Excoriation: No Induration: No Crepitus: No Fluctuance: No Friable: No Rash: No Scarring: No Periwound Skin Maceration: No N/A N/A Moisture: Moist: No Dry/Scaly: No Periwound Skin Color: Atrophie Blanche: No N/A N/A Cyanosis: No Ecchymosis: No Erythema: No Hemosiderin Staining: No Mottled: No Pallor: No Rubor: No Temperature: No Abnormality N/A N/A Tenderness on No N/A N/A Palpation: Wound Preparation: Ulcer Cleansing: N/A N/A Rinsed/Irrigated with Saline Topical Anesthetic Applied: Other: lidocaine 4% Treatment Notes Electronic Signature(s) Signed: 05/17/2015 2:51:02 PM By: Elpidio Eric BSN, RN Entered By: Elpidio Eric on 05/17/2015 14:51:02 [name] ([id number]) [name] ([id number]) -------------------------------------------------------------------------------- Multi-Disciplinary Care Plan Details Patient Name: Traci Iha A. Date of Service: 05/17/2015 2:30 PM Medical Record Number: [id number] Patient Account Number: [id number] Date of Birth/Sex: [birth year] (73 y.o. Female) Treating RN: [name], RN, BSN, [place] Sink Primary Care Physician: SYSTEM, PCP Other Clinician: Referring Physician: Lorie Phenix Treating Physician/Extender: Rudene Re in Treatment: 8 Active Inactive Abuse / Safety / Falls / Self Care Management Nursing Diagnoses: Potential for falls Goals: Patient will remain injury free Date Initiated: 03/16/2015 Goal Status: Active Interventions: Assess fall risk on admission and as needed Notes: Orientation to the Wound Care Program Nursing Diagnoses: Knowledge deficit related to the wound healing center program Goals: Patient/caregiver will verbalize understanding of the Wound Healing Center Program Date Initiated: 03/16/2015 Goal Status: Active Interventions: Provide education on orientation to the wound  center Notes: Pressure Nursing Diagnoses: Potential for impaired tissue integrity related to pressure, friction, moisture, and shear Goals: Patient will remain free from development of additional pressure ulcers Date Initiated: 03/16/2015 [name] ([id number]) Goal Status: Active Interventions: Assess potential for pressure ulcer upon admission and as needed Notes: Wound/Skin Impairment Nursing Diagnoses: Impaired tissue integrity Goals: Ulcer/skin breakdown will have a volume reduction of 30% by week 4 Date Initiated: 03/16/2015 Goal Status: Active Interventions: Assess patient/caregiver ability to perform ulcer/skin care regimen upon admission and as needed Notes: Electronic Signature(s) Signed: 05/17/2015 2:50:54 PM By: Elpidio Eric BSN, RN Entered By: Elpidio Eric on 05/17/2015 14:50:54 Traci Iha A. ([id number]) -------------------------------------------------------------------------------- Pain Assessment Details Patient Name: Traci Iha A. Date of Service: 05/17/2015 2:30 PM Medical Record Number: [id number] Patient Account Number: [id number] Date of Birth/Sex: 17-Jan-1941 (73 y.o. Female) Treating RN: [name], RN, BSN, [employer] Primary Care Physician: SYSTEM, PCP Other Clinician: Referring Physician: Lorie Phenix Treating Physician/Extender: Rudene Re in Treatment: 8 Active Problems Location  of Pain Severity and Description of Pain Patient Has Paino No Site Locations Pain Management and Medication Current Pain Management: Electronic Signature(s) Signed: 05/17/2015 2:37:11 PM By: Elpidio Eric BSN, RN Entered By: Elpidio Eric on 05/17/2015 14:37:11 [name] ([id number]) -------------------------------------------------------------------------------- Patient/Caregiver Education Details Patient Name: Traci Iha A. Date of Service: 05/17/2015 2:30 PM Medical Record Number: [id number] Patient Account Number: [id number] Date of Birth/Gender:  March 16, 1941 (74 y.o. Female) Treating RN: Clover Mealy, RN, BSN, [place] Sink Primary Care Physician: SYSTEM, PCP Other Clinician: Referring Physician: Lorie Phenix Treating Physician/Extender: Rudene Re in Treatment: 8 Education Assessment Education Provided To: Patient Education Topics Provided Welcome To The Wound Care Center: Methods: Explain/Verbal Responses: State content correctly Electronic Signature(s) Signed: 05/22/2015 10:24:16 AM By: Elpidio Eric BSN, RN Entered By: Elpidio Eric on 05/22/2015 10:24:16 [name] ([id number]) -------------------------------------------------------------------------------- Wound Assessment Details Patient Name: Traci Iha A. Date of Service: 05/17/2015 2:30 PM Medical Record Number: [id number] Patient Account Number: [id number] Date of Birth/Sex: 1940/10/20 (73 y.o. Female) Treating RN: [name], RN, BSN, [place] Primary Care Physician: SYSTEM, PCP Other Clinician: Referring Physician: Lorie Phenix Treating Physician/Extender: Rudene Re in Treatment: 8 Wound Status Wound Number: 1 Primary Pressure Ulcer Etiology: Wound Location: Left Calcaneous Wound Open Wounding Event: Pressure Injury Status: Date Acquired: 02/12/2015 Comorbid Anemia, Chronic Obstructive Pulmonary Weeks Of Treatment: 8 History: Disease (COPD), Sleep Apnea, Clustered Wound: No Arrhythmia, Congestive Heart Failure, Coronary Artery Disease, Hypertension, Peripheral Venous Disease, Type II Diabetes, Osteoarthritis, Neuropathy Photos Photo Uploaded By: Elpidio Eric on 05/17/2015 17:17:57 Wound Measurements Length: (cm) 0.2 Width: (cm) 0.2 Depth: (cm) 0.2 Area: (cm) 0.031 Volume: (cm) 0.006 % Reduction in Area: 99.6% % Reduction in Volume: 99.2% Epithelialization: Large (67-100%) Tunneling: No Undermining: No Wound Description Classification: Category/Stage III Foul Odor Diabetic Severity (Wagner): Grade 2 Due to Pro Wound Margin: Flat and  Intact Exudate Amount: None Present After Cleansing: Yes duct Use: No Wound Bed Granulation Amount: Large (67-100%) Exposed Structure Granulation Quality: Pink Fascia Exposed: No [name], [name] ([id number]) Necrotic Amount: None Present (0%) Fat Layer Exposed: No Tendon Exposed: No Muscle Exposed: No Joint Exposed: No Bone Exposed: No Limited to Skin Breakdown Periwound Skin Texture Texture Color No Abnormalities Noted: No No Abnormalities Noted: No Callus: Yes Atrophie Blanche: No Crepitus: No Cyanosis: No Excoriation: No Ecchymosis: No Fluctuance: No Erythema: No Friable: No Hemosiderin Staining: No Induration: No Mottled: No Localized Edema: No Pallor: No Rash: No Rubor: No Scarring: No Temperature / Pain Moisture Temperature: No Abnormality No Abnormalities Noted: No Dry / Scaly: No Maceration: No Moist: No Wound Preparation Ulcer Cleansing: Rinsed/Irrigated with Saline Topical Anesthetic Applied: Other: lidocaine 4%, Treatment Notes Wound #1 (Left Calcaneous) 1. Cleansed with: Clean wound with Normal Saline 3. Peri-wound Care: Skin Prep 4. Dressing Applied: Mepitel 5. Secondary Dressing Applied Bordered Foam Dressing Notes stretch netting Electronic Signature(s) Signed: 05/17/2015 3:02:14 PM By: Elpidio Eric BSN, RN Previous Signature: 05/17/2015 2:41:38 PM Version By: Elpidio Eric BSN, RN Previous Signature: 05/17/2015 2:40:09 PM Version By: Elpidio Eric BSN, RN [name] ([id number]) Entered By: Elpidio Eric on 05/17/2015 15:02:13 [name] ([id number]) -------------------------------------------------------------------------------- Vitals Details Patient Name: Traci Iha A. Date of Service: 05/17/2015 2:30 PM Medical Record Number: [id number] Patient Account Number: [id number] Date of Birth/Sex: 10/15/1940 (73 y.o. Female) Treating RN: Clover Mealy, RN, BSN, [place] Sink Primary Care Physician: SYSTEM, PCP Other Clinician: Referring Physician:  Lorie Phenix Treating Physician/Extender: Rudene Re in Treatment: 8 Vital Signs Time Taken: 14:41 Temperature (F): 98.9 Height (in): 62 Pulse (bpm):  78 Weight (lbs): 250 Respiratory Rate (breaths/min): 22 Body Mass Index (BMI): 45.7 Blood Pressure (mmHg): 103/60 Reference Range: 80 - 120 mg / dl Electronic Signature(s) Signed: 05/17/2015 2:42:03 PM By: Elpidio Eric BSN, RN Entered By: Elpidio Eric on 05/17/2015 14:42:03

## 2015-05-24 ENCOUNTER — Encounter: Payer: Medicare Other | Admitting: Surgery

## 2015-05-24 DIAGNOSIS — L89623 Pressure ulcer of left heel, stage 3: Secondary | ICD-10-CM | POA: Diagnosis not present

## 2015-05-24 NOTE — Progress Notes (Addendum)
[name], [name] ([id number]) Visit Report for 05/24/2015 Chief Complaint Document Details Patient Name: [name], [name] 05/24/2015 10:00 Date of Service: AM Medical Record [id number] Number: Patient Account Number: [id number] [birth year] (74 y.o. Treating RN: Huel Coventry Date of Birth/Sex: Female) Other Clinician: Primary Care Physician: SYSTEM, PCP Treating Evlyn Kanner Referring Physician: Lorie Phenix Physician/Extender: Weeks in Treatment: 9 Information Obtained from: Patient Chief Complaint Patient presents to the wound care center for a consult due non healing wound. 74 year old patient who comes with the chief complaints of having a ulcerated area on her left heel for about a month. Electronic Signature(s) Signed: 05/24/2015 10:32:21 AM By: Evlyn Kanner MD, FACS Entered By: Evlyn Kanner on 05/24/2015 10:32:21 [name] ([id number]) -------------------------------------------------------------------------------- HPI Details Patient Name: Traci Iha A. 05/24/2015 10:00 Date of Service: AM Medical Record [id number] Number: Patient Account Number: [id number] 1941-07-14 (74 y.o. Treating RN: Huel Coventry Date of Birth/Sex: Female) Other Clinician: Primary Care Physician: SYSTEM, PCP Treating Evlyn Kanner Referring Physician: Lorie Phenix Physician/Extender: Weeks in Treatment: 9 History of Present Illness Location: bilateral pressure injuries to both heels, left more than right Quality: Patient reports No Pain. Severity: Patient states wound are getting worse. Duration: Patient has had the wound for > 1 months prior to seeking treatment at the wound center Context: The wound appeared gradually over time Modifying Factors: Consults to this date include:some local care as per the nursing home staff Associated Signs and Symptoms: Patient reports having:no sensation on her feet whatsoever HPI Description: 74 year old patient who is known to have several  comorbidities including Diabetes mellitus, opoid dependence, cervical spondylosis, GERD, anemia, dermatitis, general debility and pressure ulcers of both heels is here for an opinion regarding the latter. She does not check her blood sugar every day and does not know the last blood values which were done about 2 weeks ago. she has been a smoker all her life and was smoking up to a pack and a half a day but now smokes 1 packet in 3 days. PAST MEDICAL HISTORY: DM type 2, COPD, OSA, hypothyroidism, hypertension, diabetes, hyperlipidemia, CHF, morbid obesity, previous history of tobacco abuse, coronary artery disease, paroxysmal atrial fibrillation on Xarelto. 04/23/2015 -- he has been off loading well but still continues to smoke about 6 cigarettes a day. No other fresh issues. Electronic Signature(s) Signed: 05/24/2015 10:32:26 AM By: Evlyn Kanner MD, FACS Entered By: Evlyn Kanner on 05/24/2015 10:32:26 [name] ([id number]) -------------------------------------------------------------------------------- Physical Exam Details Patient Name: [name], [name] 05/24/2015 10:00 Date of Service: AM Medical Record [id number] Number: Patient Account Number: [id number] 1941-06-13 (74 y.o. Treating RN: Huel Coventry Date of Birth/Sex: Female) Other Clinician: Primary Care Physician: SYSTEM, PCP Treating Evlyn Kanner Referring Physician: Lorie Phenix Physician/Extender: Weeks in Treatment: 9 Constitutional . Pulse regular. Respirations normal and unlabored. Afebrile. . Eyes Nonicteric. Reactive to light. Ears, Nose, Mouth, and Throat Lips, teeth, and gums WNL.Marland Kitchen Moist mucosa without lesions . Neck supple and nontender. No palpable supraclavicular or cervical adenopathy. Normal sized without goiter. Respiratory WNL. No retractions.. Cardiovascular Pedal Pulses WNL. No clubbing, cyanosis or edema. Chest Breasts symmetical and no nipple discharge.. Breast tissue WNL, no masses,  lumps, or tenderness.. Lymphatic No adneopathy. No adenopathy. No adenopathy. Musculoskeletal Adexa without tenderness or enlargement.. Digits and nails w/o clubbing, cyanosis, infection, petechiae, ischemia, or inflammatory conditions.. Integumentary (Hair, Skin) No suspicious lesions. No crepitus or fluctuance. No peri-wound warmth or erythema. No masses.Marland Kitchen Psychiatric Judgement and insight Intact.. No evidence of depression, anxiety, or agitation.. Notes I have  gently trimmed some of the eschar and the wound is completely healed under that. Electronic Signature(s) Signed: 05/24/2015 10:33:13 AM By: Evlyn Kanner MD, FACS Entered By: Evlyn Kanner on 05/24/2015 10:33:13 [name] ([id number]) -------------------------------------------------------------------------------- Physician Orders Details Patient Name: [name], [name] 05/24/2015 10:00 Date of Service: AM Medical Record [id number] Number: Patient Account Number: [id number] 07-18-1941 (74 y.o. 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 L89.623 Pressure ulcer of left heel, stage 3 E66.01 Morbid (severe) obesity due to excess calories F17.218 Nicotine dependence, cigarettes, with other nicotine-induced disorders Discharge From [facility] Wound #1 Left Calcaneous o Discharge from Wound Care Center Electronic Signature(s) Signed: 05/24/2015 12:48:10 PM By: Elliot Gurney RN, BSN, Kim RN, BSN Signed: 05/24/2015 1:56:54 PM By: Evlyn Kanner MD, FACS Entered By: Elliot Gurney, RN, BSN, Kim on 05/24/2015  10:34:41 [name] ([id number]) -------------------------------------------------------------------------------- Problem List Details Patient Name: [name], [name] 05/24/2015 10:00 Date of Service: AM Medical Record [id number] Number: Patient Account Number: [id number] Aug 29, 1941 (73 y.o. Treating RN: Huel Coventry Date of Birth/Sex: Female) Other Clinician: Primary Care Physician: SYSTEM, PCP Treating Evlyn Kanner Referring Physician: Lorie Phenix Physician/Extender: Weeks in Treatment: 9 Active Problems ICD-10 Encounter Code Description Active Date Diagnosis E11.621 Type 2 diabetes mellitus with foot ulcer 03/16/2015 Yes E08.42 Diabetes mellitus due to underlying condition with diabetic 03/16/2015 Yes polyneuropathy L89.623 Pressure ulcer of left heel, stage 3 03/16/2015 Yes E66.01 Morbid (severe) obesity due to excess calories 03/16/2015 Yes F17.218 Nicotine dependence, cigarettes, with other nicotine- 03/16/2015 Yes induced disorders Inactive Problems Resolved Problems Electronic Signature(s) Signed: 05/24/2015 10:32:14 AM By: Evlyn Kanner MD, FACS Entered By: Evlyn Kanner on 05/24/2015 10:32:14 [name] ([id number]) -------------------------------------------------------------------------------- Progress Note Details Patient Name: Traci Iha A. 05/24/2015 10:00 Date of Service: AM Medical Record [id number] Number: Patient Account Number: [id number] [birth year] (73 y.o. Treating RN: Huel Coventry Date of Birth/Sex: Female) Other Clinician: Primary Care Physician: SYSTEM, PCP Treating Evlyn Kanner Referring Physician: Lorie Phenix Physician/Extender: Weeks in Treatment: 9 Subjective Chief Complaint Information obtained from Patient Patient presents to the wound care center for a consult due non healing wound. 74 year old patient who comes with the chief complaints of having a ulcerated area on her left heel for about a month. History of Present  Illness (HPI) The following HPI elements were documented for the patient's wound: Location: bilateral pressure injuries to both heels, left more than right Quality: Patient reports No Pain. Severity: Patient states wound are getting worse. Duration: Patient has had the wound for > 1 months prior to seeking treatment at the wound center Context: The wound appeared gradually over time Modifying Factors: Consults to this date include:some local care as per the nursing home staff Associated Signs and Symptoms: Patient reports having:no sensation on her feet whatsoever 74 year old patient who is known to have several comorbidities including Diabetes mellitus, opoid dependence, cervical spondylosis, GERD, anemia, dermatitis, general debility and pressure ulcers of both heels is here for an opinion regarding the latter. She does not check her blood sugar every day and does not know the last blood values which were done about 2 weeks ago. she has been a smoker all her life and was smoking up to a  pack and a half a day but now smokes 1 packet in 3 days. PAST MEDICAL HISTORY: DM type 2, COPD, OSA, hypothyroidism, hypertension, diabetes, hyperlipidemia, CHF, morbid obesity, previous history of tobacco abuse, coronary artery disease, paroxysmal atrial fibrillation on Xarelto. 04/23/2015 -- he has been off loading well but still continues to smoke about 6 cigarettes a day. No other fresh issues. Objective [name], [name] ([id number]) Constitutional Pulse regular. Respirations normal and unlabored. Afebrile. Vitals Time Taken: 10:19 AM, Height: 62 in, Weight: 250 lbs, BMI: 45.7, Temperature: 98.4 F, Pulse: 68 bpm, Respiratory Rate: 20 breaths/min, Blood Pressure: 129/45 mmHg. Eyes Nonicteric. Reactive to light. Ears, Nose, Mouth, and Throat Lips, teeth, and gums WNL.Marland Kitchen Moist mucosa without lesions . Neck supple and nontender. No palpable supraclavicular or cervical adenopathy. Normal sized without  goiter. Respiratory WNL. No retractions.. Cardiovascular Pedal Pulses WNL. No clubbing, cyanosis or edema. Chest Breasts symmetical and no nipple discharge.. Breast tissue WNL, no masses, lumps, or tenderness.. Lymphatic No adneopathy. No adenopathy. No adenopathy. Musculoskeletal Adexa without tenderness or enlargement.. Digits and nails w/o clubbing, cyanosis, infection, petechiae, ischemia, or inflammatory conditions.Marland Kitchen Psychiatric Judgement and insight Intact.. No evidence of depression, anxiety, or agitation.. General Notes: I have gently trimmed some of the eschar and the wound is completely healed under that. Integumentary (Hair, Skin) No suspicious lesions. No crepitus or fluctuance. No peri-wound warmth or erythema. No masses.. Wound #1 status is Healed - Epithelialized. Original cause of wound was Pressure Injury. The wound is located on the Left Calcaneous. The wound measures 0cm length x 0cm width x 0cm depth; 0cm^2 area and 0cm^3 volume. The wound is limited to skin breakdown. There is a none present amount of drainage noted. The wound margin is flat and intact. There is large (67-100%) pink granulation within the wound bed. There is no necrotic tissue within the wound bed. The periwound skin appearance exhibited: Callus. The periwound skin appearance did not exhibit: Crepitus, Excoriation, Fluctuance, Friable, Induration, Localized Edema, Rash, Scarring, Dry/Scaly, Maceration, Moist, Atrophie Blanche, Cyanosis, Ecchymosis, Hemosiderin Staining, Mottled, Pallor, Rubor, Erythema. Periwound temperature was noted as No [name], [name] ([id number]) Abnormality. Assessment Active Problems ICD-10 E11.621 - Type 2 diabetes mellitus with foot ulcer E08.42 - Diabetes mellitus due to underlying condition with diabetic polyneuropathy L89.623 - Pressure ulcer of left heel, stage 3 E66.01 - Morbid (severe) obesity due to excess calories F17.218 - Nicotine dependence, cigarettes, with  other nicotine-induced disorders The wound is completely healed and I have discharged her from the wound care services after discussing off loading and control of her diabetes in great detail. She will come back and see as as needed. Plan Discharge From [facility] Services: Wound #1 Left Calcaneous: Discharge from Wound Care Center The wound is completely healed and I have discharged her from the wound care services after discussing off loading and control of her diabetes in great detail. She will come back and see as as needed. Electronic Signature(s) Signed: 05/24/2015 4:29:31 PM By: Evlyn Kanner MD, FACS Previous Signature: 05/24/2015 10:33:56 AM Version By: Evlyn Kanner MD, FACS Entered By: Evlyn Kanner on 05/24/2015 16:29:31 [name] ([id number]) -------------------------------------------------------------------------------- SuperBill Details Patient Name: Traci Iha A. Date of Service: 05/24/2015 Medical Record Number: [id number] Patient Account Number: [id number] Date of Birth/Sex: [birth year] (74 y.o. 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 L89.623 Pressure ulcer of left heel, stage 3 E66.01 Morbid (severe) obesity due to excess calories F17.218 Nicotine dependence, cigarettes, with other nicotine-induced disorders Facility Procedures CPT4 Code: [id number] Description: [phone] - WOUND CARE VISIT-LEV 2 EST PT Modifier: Quantity: 1 Physician Procedures CPT4 Code Description: [id number] - WC PHYS LEVEL 3 - EST PT ICD-10 Description Diagnosis E11.621 Type 2 diabetes mellitus with foot ulcer E08.42 Diabetes mellitus due to underlying condition with L89.623  Pressure ulcer of left heel, stage 3 Modifier: diabetic poly Quantity: 1 neuropathy Electronic Signature(s) Signed: 05/24/2015 12:48:10 PM By: Elliot Gurney, RN, BSN, Kim RN, BSN Signed: 05/24/2015 1:56:54 PM By: Evlyn Kanner MD, FACS Previous Signature: 05/24/2015 10:34:14 AM Version By: Evlyn Kanner MD, FACS Entered By: Elliot Gurney RN, BSN, Kim on 05/24/2015 10:46:30

## 2015-05-24 NOTE — Progress Notes (Signed)
SAANYA, ZIESKE ([id number]) Visit Report for 05/24/2015 Arrival Information Details Patient Name: [name], [name] Date of Service: 05/24/2015 10:00 AM Medical Record Number: [id number] Patient Account Number: [id number] Date of Birth/Sex: March 24, 1941 (74 y.o. Female) Treating RN: Huel Coventry Primary Care Physician: SYSTEM, PCP Other Clinician: Referring Physician: Lorie Phenix Treating Physician/Extender: Rudene Re in Treatment: 9 Visit Information History Since Last Visit Added or deleted any medications: No Patient Arrived: Wheel Chair Any new allergies or adverse reactions: No Arrival Time: 10:19 Had a fall or experienced change in No Accompanied By: self activities of daily living that may affect Transfer Assistance: None risk of falls: Patient Identification Verified: Yes Signs or symptoms of abuse/neglect since last No Secondary Verification Process Yes visito Completed: Hospitalized since last visit: No Patient Has Alerts: Yes Has Dressing in Place as Prescribed: Yes Patient Alerts: Patient on Blood Pain Present Now: No Thinner DMII xarelto, asa Electronic Signature(s) Signed: 05/24/2015 12:48:10 PM By: Elliot Gurney, RN, BSN, Kim RN, BSN Entered By: Elliot Gurney, RN, BSN, Kim on 05/24/2015 10:19:41 [name] ([id number]) -------------------------------------------------------------------------------- Clinic Level of Care Assessment Details Patient Name: Traci Iha A. Date of Service: 05/24/2015 10:00 AM Medical Record Number: [id number] Patient Account Number: [id number] Date of Birth/Sex: 02-21-1941 (74 y.o. Female) Treating RN: Huel Coventry Primary Care Physician: SYSTEM, PCP Other Clinician: Referring Physician: Lorie Phenix Treating Physician/Extender: Rudene Re in Treatment: 9 Clinic Level of Care Assessment Items TOOL 4 Quantity Score []  - Use when only an EandM is performed on FOLLOW-UP visit 0 ASSESSMENTS - Nursing Assessment /  Reassessment []  - Reassessment of Co-morbidities (includes updates in patient status) 0 X - Reassessment of Adherence to Treatment Plan 1 5 ASSESSMENTS - Wound and Skin Assessment / Reassessment X - Simple Wound Assessment / Reassessment - one wound 1 5 []  - Complex Wound Assessment / Reassessment - multiple wounds 0 []  - Dermatologic / Skin Assessment (not related to wound area) 0 ASSESSMENTS - Focused Assessment []  - Circumferential Edema Measurements - multi extremities 0 []  - Nutritional Assessment / Counseling / Intervention 0 []  - Lower Extremity Assessment (monofilament, tuning fork, pulses) 0 []  - Peripheral Arterial Disease Assessment (using hand held doppler) 0 ASSESSMENTS - Ostomy and/or Continence Assessment and Care []  - Incontinence Assessment and Management 0 []  - Ostomy Care Assessment and Management (repouching, etc.) 0 PROCESS - Coordination of Care X - Simple Patient / Family Education for ongoing care 1 15 []  - Complex (extensive) Patient / Family Education for ongoing care 0 []  - Staff obtains Chiropractor, Records, Test Results / Process Orders 0 []  - Staff telephones HHA, Nursing Homes / Clarify orders / etc 0 []  - Routine Transfer to another Facility (non-emergent condition) 0 [name], [name] ([id number]) []  - Routine Hospital Admission (non-emergent condition) 0 []  - New Admissions / Manufacturing engineer / Ordering NPWT, Apligraf, etc. 0 []  - Emergency Hospital Admission (emergent condition) 0 X - Simple Discharge Coordination 1 10 []  - Complex (extensive) Discharge Coordination 0 PROCESS - Special Needs []  - Pediatric / Minor Patient Management 0 []  - Isolation Patient Management 0 []  - Hearing / Language / Visual special needs 0 []  - Assessment of Community assistance (transportation, D/C planning, etc.) 0 []  - Additional assistance / Altered mentation 0 []  - Support Surface(s) Assessment (bed, cushion, seat, etc.) 0 INTERVENTIONS - Wound Cleansing /  Measurement []  - Simple Wound Cleansing - one wound 0 []  - Complex Wound Cleansing - multiple wounds 0 []  - Wound Imaging (photographs -  any number of wounds) 0 []  - Wound Tracing (instead of photographs) 0 []  - Simple Wound Measurement - one wound 0 []  - Complex Wound Measurement - multiple wounds 0 INTERVENTIONS - Wound Dressings []  - Small Wound Dressing one or multiple wounds 0 []  - Medium Wound Dressing one or multiple wounds 0 []  - Large Wound Dressing one or multiple wounds 0 []  - Application of Medications - topical 0 []  - Application of Medications - injection 0 INTERVENTIONS - Miscellaneous []  - External ear exam 0 [name], [name] ([id number]) []  - Specimen Collection (cultures, biopsies, blood, body fluids, etc.) 0 []  - Specimen(s) / Culture(s) sent or taken to Lab for analysis 0 []  - Patient Transfer (multiple staff / Michiel Sites Lift / Similar devices) 0 []  - Simple Staple / Suture removal (25 or less) 0 []  - Complex Staple / Suture removal (26 or more) 0 []  - Hypo / Hyperglycemic Management (close monitor of Blood Glucose) 0 []  - Ankle / Brachial Index (ABI) - do not check if billed separately 0 X - Vital Signs 1 5 Has the patient been seen at the hospital within the last three years: Yes Total Score: 40 Level Of Care: New/Established - Level 2 Electronic Signature(s) Signed: 05/24/2015 12:48:10 PM By: Elliot Gurney, RN, BSN, Kim RN, BSN Entered By: Elliot Gurney, RN, BSN, Kim on 05/24/2015 10:35:02 [name] ([id number]) -------------------------------------------------------------------------------- Encounter Discharge Information Details Patient Name: Traci Iha A. Date of Service: 05/24/2015 10:00 AM Medical Record Number: [id number] Patient Account Number: [id number] Date of Birth/Sex: [birth year] (74 y.o. Female) Treating RN: Huel Coventry Primary Care Physician: SYSTEM, PCP Other Clinician: Referring Physician: Lorie Phenix Treating Physician/Extender: Rudene Re  in Treatment: 9 Encounter Discharge Information Items Discharge Pain Level: 0 Discharge Condition: Stable Ambulatory Status: Wheelchair Discharge Destination: Nursing Home Transportation: Private Auto Accompanied By: self Schedule Follow-up Appointment: No Medication Reconciliation completed and provided to Patient/Care Yes [name]: Provided on Clinical Summary of Care: 05/24/2015 Form Type Recipient Paper Patient GG Electronic Signature(s) Signed: 05/24/2015 12:48:10 PM By: Elliot Gurney RN, BSN, Kim RN, BSN Previous Signature: 05/24/2015 10:38:00 AM Version By: Gwenlyn Perking Entered By: Elliot Gurney RN, BSN, Kim on 05/24/2015 10:46:59 [name] ([id number]) -------------------------------------------------------------------------------- Lower Extremity Assessment Details Patient Name: Traci Iha A. Date of Service: 05/24/2015 10:00 AM Medical Record Number: [id number] Patient Account Number: [id number] Date of Birth/Sex: [birth year] (74 y.o. Female) Treating RN: Huel Coventry Primary Care Physician: SYSTEM, PCP Other Clinician: Referring Physician: Lorie Phenix Treating Physician/Extender: Rudene Re in Treatment: 9 Vascular Assessment Pulses: Posterior Tibial Dorsalis Pedis Palpable: [Left:Yes] Extremity colors, hair growth, and conditions: Extremity Color: [Left:Mottled] Hair Growth on Extremity: [Left:Yes] Temperature of Extremity: [Left:Warm] Capillary Refill: [Left:< 3 seconds] Toe Nail Assessment Left: Right: Thick: Yes Discolored: Yes Deformed: No Improper Length and Hygiene: No Electronic Signature(s) Signed: 05/24/2015 12:48:10 PM By: Elliot Gurney, RN, BSN, Kim RN, BSN Entered By: Elliot Gurney, RN, BSN, Kim on 05/24/2015 10:21:48 [name] ([id number]) -------------------------------------------------------------------------------- Multi Wound Chart Details Patient Name: Traci Iha A. Date of Service: 05/24/2015 10:00 AM Medical Record Number:  [id number] Patient Account Number: [id number] Date of Birth/Sex: 04/05/1941 (74 y.o. Female) Treating RN: Huel Coventry Primary Care Physician: SYSTEM, PCP Other Clinician: Referring Physician: Lorie Phenix Treating Physician/Extender: Rudene Re in Treatment: 9 Vital Signs Height(in): 62 Pulse(bpm): 68 Weight(lbs): 250 Blood Pressure 129/45 (mmHg): Body Mass Index(BMI): 46 Temperature(F): 98.4 Respiratory Rate 20 (breaths/min): Photos: [1:No Photos] [N/A:N/A] Wound Location: [1:Left Calcaneous] [N/A:N/A] Wounding Event: [1:Pressure Injury] [N/A:N/A] Primary Etiology: [1:Pressure Ulcer] [N/A:N/A]  Date Acquired: [1:02/12/2015] [N/A:N/A] Weeks of Treatment: [1:9] [N/A:N/A] Wound Status: [1:Open] [N/A:N/A] Measurements L x W x D 0.1x0.1x0.1 [N/A:N/A] (cm) Area (cm) : [1:0.008] [N/A:N/A] Volume (cm) : [1:0.001] [N/A:N/A] % Reduction in Area: [1:99.90%] [N/A:N/A] % Reduction in Volume: 99.90% [N/A:N/A] Classification: [1:Category/Stage III] [N/A:N/A] Periwound Skin Texture: No Abnormalities Noted [N/A:N/A] Periwound Skin [1:No Abnormalities Noted] [N/A:N/A] Moisture: Periwound Skin Color: No Abnormalities Noted [N/A:N/A] Tenderness on [1:No] [N/A:N/A] Treatment Notes Electronic Signature(s) Signed: 05/24/2015 12:48:10 PM By: Elliot Gurney, RN, BSN, Kim RN, BSN Entered By: Elliot Gurney, RN, BSN, Kim on 05/24/2015 10:34:27 [name] ([id number]) -------------------------------------------------------------------------------- Multi-Disciplinary Care Plan Details Patient Name: Traci Iha A. Date of Service: 05/24/2015 10:00 AM Medical Record Number: [id number] Patient Account Number: [id number] Date of Birth/Sex: 1941/02/05 (74 y.o. Female) Treating RN: Huel Coventry Primary Care Physician: SYSTEM, PCP Other Clinician: Referring Physician: Lorie Phenix Treating Physician/Extender: Rudene Re in Treatment: 9 Active Inactive Electronic Signature(s) Signed:  05/24/2015 12:48:10 PM By: Elliot Gurney, RN, BSN, Kim RN, BSN Entered By: Elliot Gurney, RN, BSN, Kim on 05/24/2015 10:31:14 [name] ([id number]) -------------------------------------------------------------------------------- Pain Assessment Details Patient Name: Traci Iha A. Date of Service: 05/24/2015 10:00 AM Medical Record Number: [id number] Patient Account Number: [id number] Date of Birth/Sex: [birth year] (74 y.o. Female) Treating RN: Huel Coventry Primary Care Physician: SYSTEM, PCP Other Clinician: Referring Physician: Lorie Phenix Treating Physician/Extender: Rudene Re in Treatment: 9 Active Problems Location of Pain Severity and Description of Pain Patient Has Paino No Site Locations Pain Management and Medication Current Pain Management: Electronic Signature(s) Signed: 05/24/2015 12:48:10 PM By: Elliot Gurney, RN, BSN, Kim RN, BSN Entered By: Elliot Gurney, RN, BSN, Kim on 05/24/2015 10:19:47 [name] ([id number]) -------------------------------------------------------------------------------- Patient/Caregiver Education Details Patient Name: Traci Iha A. Date of Service: 05/24/2015 10:00 AM Medical Record Number: [id number] Patient Account Number: [id number] Date of Birth/Gender: 1941/01/05 (74 y.o. Female) Treating RN: Huel Coventry Primary Care Physician: SYSTEM, PCP Other Clinician: Referring Physician: Lorie Phenix Treating Physician/Extender: Rudene Re in Treatment: 9 Education Assessment Education Provided To: Patient and Caregiver Education Topics Provided Pressure: Handouts: Other: keep pressure off heels. Electronic Signature(s) Signed: 05/24/2015 12:48:10 PM By: Elliot Gurney, RN, BSN, Kim RN, BSN Entered By: Elliot Gurney, RN, BSN, Kim on 05/24/2015 10:47:20 [name] ([id number]) -------------------------------------------------------------------------------- Wound Assessment Details Patient Name: Traci Iha A. Date of Service: 05/24/2015 10:00  AM Medical Record Number: [id number] Patient Account Number: [id number] Date of Birth/Sex: [birth year] (74 y.o. Female) Treating RN: Huel Coventry Primary Care Physician: SYSTEM, PCP Other Clinician: Referring Physician: Lorie Phenix Treating Physician/Extender: Rudene Re in Treatment: 9 Wound Status Wound Number: 1 Primary Pressure Ulcer Etiology: Wound Location: Left Calcaneous Wound Healed - Epithelialized Wounding Event: Pressure Injury Status: Date Acquired: 02/12/2015 Comorbid Anemia, Chronic Obstructive Pulmonary Weeks Of Treatment: 9 History: Disease (COPD), Sleep Apnea, Clustered Wound: No Arrhythmia, Congestive Heart Failure, Coronary Artery Disease, Hypertension, Peripheral Venous Disease, Type II Diabetes, Osteoarthritis, Neuropathy Photos Photo Uploaded By: Elliot Gurney, RN, BSN, Kim on 05/24/2015 14:54:50 Wound Measurements Length: (cm) 0 % Reductio Width: (cm) 0 % Reductio Depth: (cm) 0 Epithelial Area: (cm) 0 Volume: (cm) 0 n in Area: 100% n in Volume: 100% ization: Large (67-100%) Wound Description Classification: Category/Stage III Foul Odor Diabetic Severity (Wagner): Grade 2 Due to Pro Wound Margin: Flat and Intact Exudate Amount: None Present After Cleansing: Yes duct Use: No Wound Bed Granulation Amount: Large (67-100%) Exposed Structure Granulation Quality: Pink Fascia Exposed: No Lemarr, [name] ([id number]) Necrotic Amount: None Present (0%) Fat Layer Exposed: No Tendon Exposed: No Muscle  Exposed: No Joint Exposed: No Bone Exposed: No Limited to Skin Breakdown Periwound Skin Texture Texture Color No Abnormalities Noted: No No Abnormalities Noted: No Callus: Yes Atrophie Blanche: No Crepitus: No Cyanosis: No Excoriation: No Ecchymosis: No Fluctuance: No Erythema: No Friable: No Hemosiderin Staining: No Induration: No Mottled: No Localized Edema: No Pallor: No Rash: No Rubor: No Scarring: No Temperature /  Pain Moisture Temperature: No Abnormality No Abnormalities Noted: No Dry / Scaly: No Maceration: No Moist: No Wound Preparation Ulcer Cleansing: Rinsed/Irrigated with Saline Topical Anesthetic Applied: Other: lidocaine 4%, Electronic Signature(s) Signed: 05/24/2015 12:48:10 PM By: Elliot Gurney, RN, BSN, Kim RN, BSN Entered By: Elliot Gurney, RN, BSN, Kim on 05/24/2015 10:46:09 [name] ([id number]) -------------------------------------------------------------------------------- Vitals Details Patient Name: Traci Iha A. Date of Service: 05/24/2015 10:00 AM Medical Record Number: [id number] Patient Account Number: [id number] Date of Birth/Sex: June 22, 1941 (74 y.o. Female) Treating RN: Huel Coventry Primary Care Physician: SYSTEM, PCP Other Clinician: Referring Physician: Lorie Phenix Treating Physician/Extender: Rudene Re in Treatment: 9 Vital Signs Time Taken: 10:19 Temperature (F): 98.4 Height (in): 62 Pulse (bpm): 68 Weight (lbs): 250 Respiratory Rate (breaths/min): 20 Body Mass Index (BMI): 45.7 Blood Pressure (mmHg): 129/45 Reference Range: 80 - 120 mg / dl Electronic Signature(s) Signed: 05/24/2015 12:48:10 PM By: Elliot Gurney, RN, BSN, Kim RN, BSN Entered By: Elliot Gurney, RN, BSN, Kim on 05/24/2015 10:20:08

## 2015-05-27 ENCOUNTER — Emergency Department: Payer: Medicare Other

## 2015-05-27 ENCOUNTER — Encounter: Payer: Self-pay | Admitting: *Deleted

## 2015-05-27 ENCOUNTER — Inpatient Hospital Stay
Admission: EM | Admit: 2015-05-27 | Discharge: 2015-05-31 | DRG: 871 | Disposition: A | Discharge status: Skilled Nursing Facility | Payer: Medicare Other | Attending: Internal Medicine | Admitting: Internal Medicine

## 2015-05-27 DIAGNOSIS — F29 Unspecified psychosis not due to a substance or known physiological condition: Secondary | ICD-10-CM | POA: Diagnosis present

## 2015-05-27 DIAGNOSIS — R6521 Severe sepsis with septic shock: Secondary | ICD-10-CM | POA: Diagnosis present

## 2015-05-27 DIAGNOSIS — Z9981 Dependence on supplemental oxygen: Secondary | ICD-10-CM

## 2015-05-27 DIAGNOSIS — Z7982 Long term (current) use of aspirin: Secondary | ICD-10-CM | POA: Diagnosis not present

## 2015-05-27 DIAGNOSIS — M519 Unspecified thoracic, thoracolumbar and lumbosacral intervertebral disc disorder: Secondary | ICD-10-CM | POA: Diagnosis present

## 2015-05-27 DIAGNOSIS — Z79899 Other long term (current) drug therapy: Secondary | ICD-10-CM | POA: Diagnosis not present

## 2015-05-27 DIAGNOSIS — Z791 Long term (current) use of non-steroidal anti-inflammatories (NSAID): Secondary | ICD-10-CM

## 2015-05-27 DIAGNOSIS — Z79891 Long term (current) use of opiate analgesic: Secondary | ICD-10-CM

## 2015-05-27 DIAGNOSIS — N3001 Acute cystitis with hematuria: Secondary | ICD-10-CM | POA: Diagnosis present

## 2015-05-27 DIAGNOSIS — G4733 Obstructive sleep apnea (adult) (pediatric): Secondary | ICD-10-CM | POA: Diagnosis present

## 2015-05-27 DIAGNOSIS — J449 Chronic obstructive pulmonary disease, unspecified: Secondary | ICD-10-CM | POA: Diagnosis not present

## 2015-05-27 DIAGNOSIS — Z888 Allergy status to other drugs, medicaments and biological substances status: Secondary | ICD-10-CM

## 2015-05-27 DIAGNOSIS — K219 Gastro-esophageal reflux disease without esophagitis: Secondary | ICD-10-CM | POA: Diagnosis present

## 2015-05-27 DIAGNOSIS — I10 Essential (primary) hypertension: Secondary | ICD-10-CM | POA: Diagnosis present

## 2015-05-27 DIAGNOSIS — G2581 Restless legs syndrome: Secondary | ICD-10-CM | POA: Diagnosis present

## 2015-05-27 DIAGNOSIS — Z993 Dependence on wheelchair: Secondary | ICD-10-CM

## 2015-05-27 DIAGNOSIS — I4891 Unspecified atrial fibrillation: Secondary | ICD-10-CM | POA: Diagnosis present

## 2015-05-27 DIAGNOSIS — A4189 Other specified sepsis: Secondary | ICD-10-CM

## 2015-05-27 DIAGNOSIS — N390 Urinary tract infection, site not specified: Secondary | ICD-10-CM

## 2015-05-27 DIAGNOSIS — L89302 Pressure ulcer of unspecified buttock, stage 2: Secondary | ICD-10-CM | POA: Diagnosis present

## 2015-05-27 DIAGNOSIS — I252 Old myocardial infarction: Secondary | ICD-10-CM

## 2015-05-27 DIAGNOSIS — L8990 Pressure ulcer of unspecified site, unspecified stage: Secondary | ICD-10-CM | POA: Insufficient documentation

## 2015-05-27 DIAGNOSIS — F418 Other specified anxiety disorders: Secondary | ICD-10-CM | POA: Diagnosis present

## 2015-05-27 DIAGNOSIS — A419 Sepsis, unspecified organism: Principal | ICD-10-CM | POA: Diagnosis present

## 2015-05-27 DIAGNOSIS — E785 Hyperlipidemia, unspecified: Secondary | ICD-10-CM | POA: Diagnosis present

## 2015-05-27 DIAGNOSIS — Z7952 Long term (current) use of systemic steroids: Secondary | ICD-10-CM

## 2015-05-27 DIAGNOSIS — N179 Acute kidney failure, unspecified: Secondary | ICD-10-CM

## 2015-05-27 DIAGNOSIS — E1140 Type 2 diabetes mellitus with diabetic neuropathy, unspecified: Secondary | ICD-10-CM | POA: Diagnosis present

## 2015-05-27 DIAGNOSIS — I2510 Atherosclerotic heart disease of native coronary artery without angina pectoris: Secondary | ICD-10-CM | POA: Diagnosis present

## 2015-05-27 DIAGNOSIS — Z95828 Presence of other vascular implants and grafts: Secondary | ICD-10-CM

## 2015-05-27 DIAGNOSIS — N170 Acute kidney failure with tubular necrosis: Secondary | ICD-10-CM | POA: Diagnosis present

## 2015-05-27 DIAGNOSIS — F17210 Nicotine dependence, cigarettes, uncomplicated: Secondary | ICD-10-CM | POA: Diagnosis present

## 2015-05-27 DIAGNOSIS — B352 Tinea manuum: Secondary | ICD-10-CM | POA: Diagnosis present

## 2015-05-27 DIAGNOSIS — Z7901 Long term (current) use of anticoagulants: Secondary | ICD-10-CM

## 2015-05-27 DIAGNOSIS — Z96659 Presence of unspecified artificial knee joint: Secondary | ICD-10-CM | POA: Diagnosis present

## 2015-05-27 DIAGNOSIS — J441 Chronic obstructive pulmonary disease with (acute) exacerbation: Secondary | ICD-10-CM | POA: Diagnosis present

## 2015-05-27 DIAGNOSIS — E079 Disorder of thyroid, unspecified: Secondary | ICD-10-CM | POA: Diagnosis present

## 2015-05-27 DIAGNOSIS — A4151 Sepsis due to Escherichia coli [E. coli]: Secondary | ICD-10-CM | POA: Diagnosis not present

## 2015-05-27 LAB — URINALYSIS COMPLETE WITH MICROSCOPIC (ARMC ONLY)
Bilirubin Urine: NEGATIVE
GLUCOSE, UA: NEGATIVE mg/dL
Ketones, ur: NEGATIVE mg/dL
Nitrite: POSITIVE — AB
PH: 7 (ref 5.0–8.0)
PROTEIN: 100 mg/dL — AB
Specific Gravity, Urine: 1.013 (ref 1.005–1.030)

## 2015-05-27 LAB — COMPREHENSIVE METABOLIC PANEL
ALBUMIN: 3.2 g/dL — AB (ref 3.5–5.0)
ALK PHOS: 95 U/L (ref 38–126)
ALT: 12 U/L — AB (ref 14–54)
ANION GAP: 10 (ref 5–15)
AST: 26 U/L (ref 15–41)
BILIRUBIN TOTAL: 0.7 mg/dL (ref 0.3–1.2)
BUN: 36 mg/dL — ABNORMAL HIGH (ref 6–20)
CALCIUM: 9 mg/dL (ref 8.9–10.3)
CO2: 29 mmol/L (ref 22–32)
Chloride: 99 mmol/L — ABNORMAL LOW (ref 101–111)
Creatinine, Ser: 1.28 mg/dL — ABNORMAL HIGH (ref 0.44–1.00)
GFR calc non Af Amer: 41 mL/min — ABNORMAL LOW (ref >60)
GFR, EST AFRICAN AMERICAN: 47 mL/min — AB (ref >60)
Glucose, Bld: 152 mg/dL — ABNORMAL HIGH (ref 65–99)
Potassium: 4.6 mmol/L (ref 3.5–5.1)
Sodium: 138 mmol/L (ref 135–145)
TOTAL PROTEIN: 8.3 g/dL — AB (ref 6.5–8.1)

## 2015-05-27 LAB — CBC WITH DIFFERENTIAL/PLATELET
BASOS ABS: 0.1 10*3/uL (ref 0–0.1)
BASOS PCT: 0 %
Eosinophils Absolute: 0 10*3/uL (ref 0–0.7)
Eosinophils Relative: 0 %
HEMATOCRIT: 36.5 % (ref 35.0–47.0)
HEMOGLOBIN: 11.6 g/dL — AB (ref 12.0–16.0)
Lymphocytes Relative: 3 %
Lymphs Abs: 0.5 10*3/uL — ABNORMAL LOW (ref 1.0–3.6)
MCH: 27 pg (ref 26.0–34.0)
MCHC: 31.9 g/dL — ABNORMAL LOW (ref 32.0–36.0)
MCV: 84.8 fL (ref 80.0–100.0)
Monocytes Absolute: 1.9 10*3/uL — ABNORMAL HIGH (ref 0.2–0.9)
Monocytes Relative: 10 %
NEUTROS ABS: 17.1 10*3/uL — AB (ref 1.4–6.5)
NEUTROS PCT: 87 %
Platelets: 250 10*3/uL (ref 150–440)
RBC: 4.3 MIL/uL (ref 3.80–5.20)
RDW: 17.3 % — AB (ref 11.5–14.5)
WBC: 19.7 10*3/uL — ABNORMAL HIGH (ref 3.6–11.0)

## 2015-05-27 LAB — LIPASE, BLOOD: LIPASE: 12 U/L — AB (ref 22–51)

## 2015-05-27 LAB — LACTIC ACID, PLASMA
LACTIC ACID, VENOUS: 1.3 mmol/L (ref 0.5–2.0)
Lactic Acid, Venous: 0.4 mmol/L — ABNORMAL LOW (ref 0.5–2.0)

## 2015-05-27 LAB — TROPONIN I: TROPONIN I: 0.03 ng/mL (ref <0.031)

## 2015-05-27 LAB — MRSA PCR SCREENING: MRSA by PCR: NEGATIVE

## 2015-05-27 LAB — GLUCOSE, CAPILLARY
Glucose-Capillary: 184 mg/dL — ABNORMAL HIGH (ref 65–99)
Glucose-Capillary: 246 mg/dL — ABNORMAL HIGH (ref 65–99)

## 2015-05-27 MED ORDER — RIVAROXABAN 20 MG PO TABS
20.0000 mg | ORAL_TABLET | Freq: Every day | ORAL | Status: DC
  Administered 2015-05-28 – 2015-05-30 (×3): 20 mg via ORAL
  Filled 2015-05-27 (×3): qty 1

## 2015-05-27 MED ORDER — ACETAMINOPHEN 325 MG PO TABS: 650.0000 mg | ORAL_TABLET | Freq: Four times a day (QID) | ORAL | Status: DC | PRN

## 2015-05-27 MED ORDER — ROPINIROLE HCL 1 MG PO TABS
1.0000 mg | ORAL_TABLET | Freq: Every day | ORAL | Status: DC
Start: 2015-05-27 — End: 2015-05-31
  Administered 2015-05-28 – 2015-05-30 (×3): 1 mg via ORAL
  Filled 2015-05-27 (×3): qty 1

## 2015-05-27 MED ORDER — PIPERACILLIN SOD-TAZOBACTAM SO 3.375 (3-0.375) G IV SOLR
INTRAVENOUS | Status: AC
  Filled 2015-05-27: qty 3.38

## 2015-05-27 MED ORDER — DOPAMINE-DEXTROSE 3.2-5 MG/ML-% IV SOLN
0.0000 ug/kg/min | INTRAVENOUS | Status: DC
  Administered 2015-05-27: 5.021 ug/kg/min via INTRAVENOUS
  Filled 2015-05-27: qty 250

## 2015-05-27 MED ORDER — METHYLPREDNISOLONE SODIUM SUCC 125 MG IJ SOLR
125.0000 mg | INTRAMUSCULAR | Status: AC
  Administered 2015-05-27: 125 mg via INTRAVENOUS
  Filled 2015-05-27: qty 2

## 2015-05-27 MED ORDER — VITAMIN C 500 MG PO TABS
500.0000 mg | ORAL_TABLET | Freq: Two times a day (BID) | ORAL | Status: DC
  Administered 2015-05-28 – 2015-05-30 (×6): 500 mg via ORAL
  Filled 2015-05-27 (×6): qty 1

## 2015-05-27 MED ORDER — IPRATROPIUM-ALBUTEROL 0.5-2.5 (3) MG/3ML IN SOLN
3.0000 mL | RESPIRATORY_TRACT | Status: DC
  Administered 2015-05-27 – 2015-05-28 (×5): 3 mL via RESPIRATORY_TRACT
  Filled 2015-05-27 (×6): qty 3

## 2015-05-27 MED ORDER — MAGNESIUM OXIDE 400 (241.3 MG) MG PO TABS
400.0000 mg | ORAL_TABLET | Freq: Every day | ORAL | Status: DC
  Administered 2015-05-28 – 2015-05-30 (×3): 400 mg via ORAL
  Filled 2015-05-27 (×3): qty 1

## 2015-05-27 MED ORDER — POLYVINYL ALCOHOL 1.4 % OP SOLN: 1.0000 [drp] | Freq: Four times a day (QID) | OPHTHALMIC | Status: DC | PRN

## 2015-05-27 MED ORDER — TIOTROPIUM BROMIDE MONOHYDRATE 18 MCG IN CAPS
18.0000 ug | ORAL_CAPSULE | Freq: Every day | RESPIRATORY_TRACT | Status: DC
Start: 2015-05-27 — End: 2015-05-29
  Administered 2015-05-28: 18 ug via RESPIRATORY_TRACT
  Filled 2015-05-27: qty 5

## 2015-05-27 MED ORDER — MOMETASONE FURO-FORMOTEROL FUM 100-5 MCG/ACT IN AERO
2.0000 | INHALATION_SPRAY | Freq: Two times a day (BID) | RESPIRATORY_TRACT | Status: DC
  Administered 2015-05-28: 2 via RESPIRATORY_TRACT
  Filled 2015-05-27: qty 8.8

## 2015-05-27 MED ORDER — OXYCODONE HCL 5 MG PO TABS
5.0000 mg | ORAL_TABLET | ORAL | Status: DC | PRN
  Administered 2015-05-28 – 2015-05-31 (×9): 5 mg via ORAL
  Filled 2015-05-27 (×9): qty 1

## 2015-05-27 MED ORDER — IPRATROPIUM-ALBUTEROL 0.5-2.5 (3) MG/3ML IN SOLN
3.0000 mL | RESPIRATORY_TRACT | Status: AC
  Administered 2015-05-27: 3 mL via RESPIRATORY_TRACT
  Filled 2015-05-27: qty 3

## 2015-05-27 MED ORDER — LEVOTHYROXINE SODIUM 150 MCG PO TABS
150.0000 ug | ORAL_TABLET | Freq: Every day | ORAL | Status: DC
  Administered 2015-05-28 – 2015-05-30 (×3): 150 ug via ORAL
  Filled 2015-05-27 (×3): qty 1

## 2015-05-27 MED ORDER — ACETAMINOPHEN 650 MG RE SUPP: 650.0000 mg | Freq: Four times a day (QID) | RECTAL | Status: DC | PRN

## 2015-05-27 MED ORDER — MELATONIN 5 MG PO TABS: 5.0000 mg | ORAL_TABLET | Freq: Every day | ORAL | Status: DC

## 2015-05-27 MED ORDER — GABAPENTIN 100 MG PO CAPS
100.0000 mg | ORAL_CAPSULE | Freq: Every day | ORAL | Status: DC
  Administered 2015-05-28 – 2015-05-30 (×3): 100 mg via ORAL
  Filled 2015-05-27 (×4): qty 1

## 2015-05-27 MED ORDER — MONTELUKAST SODIUM 10 MG PO TABS
10.0000 mg | ORAL_TABLET | Freq: Every day | ORAL | Status: DC
  Administered 2015-05-28 – 2015-05-30 (×3): 10 mg via ORAL
  Filled 2015-05-27 (×3): qty 1

## 2015-05-27 MED ORDER — PIPERACILLIN-TAZOBACTAM 3.375 G IVPB 30 MIN
3.3750 g | Freq: Once | INTRAVENOUS | Status: AC
  Administered 2015-05-27: 3.375 g via INTRAVENOUS

## 2015-05-27 MED ORDER — LOPERAMIDE HCL 2 MG PO CAPS
2.0000 mg | ORAL_CAPSULE | Freq: Four times a day (QID) | ORAL | Status: DC | PRN
Start: 2015-05-27 — End: 2015-05-28

## 2015-05-27 MED ORDER — FUROSEMIDE 10 MG/ML IJ SOLN
20.0000 mg | Freq: Once | INTRAMUSCULAR | Status: AC
  Administered 2015-05-27: 20 mg via INTRAVENOUS
  Filled 2015-05-27: qty 4

## 2015-05-27 MED ORDER — CLOBETASOL PROPIONATE 0.05 % EX CREA
1.0000 "application " | TOPICAL_CREAM | Freq: Two times a day (BID) | CUTANEOUS | Status: DC | PRN
  Administered 2015-05-28: 1 via TOPICAL
  Filled 2015-05-27: qty 15

## 2015-05-27 MED ORDER — ACETAMINOPHEN 500 MG PO TABS
1000.0000 mg | ORAL_TABLET | Freq: Once | ORAL | Status: AC
  Administered 2015-05-27: 1000 mg via ORAL
  Filled 2015-05-27: qty 2

## 2015-05-27 MED ORDER — METHYLPREDNISOLONE SODIUM SUCC 125 MG IJ SOLR
60.0000 mg | Freq: Four times a day (QID) | INTRAMUSCULAR | Status: DC
  Administered 2015-05-27 – 2015-05-28 (×3): 60 mg via INTRAVENOUS
  Filled 2015-05-27 (×3): qty 2

## 2015-05-27 MED ORDER — INSULIN ASPART 100 UNIT/ML ~~LOC~~ SOLN
0.0000 [IU] | Freq: Three times a day (TID) | SUBCUTANEOUS | Status: DC
  Administered 2015-05-28 (×2): 3 [IU] via SUBCUTANEOUS
  Filled 2015-05-27 (×2): qty 3

## 2015-05-27 MED ORDER — SENNA 8.6 MG PO TABS: 1.0000 | ORAL_TABLET | Freq: Every day | ORAL | Status: DC | PRN

## 2015-05-27 MED ORDER — VITAMIN D (ERGOCALCIFEROL) 1.25 MG (50000 UNIT) PO CAPS: 50000.0000 [IU] | ORAL_CAPSULE | ORAL | Status: DC

## 2015-05-27 MED ORDER — VANCOMYCIN HCL 10 G IV SOLR
1250.0000 mg | INTRAVENOUS | Status: DC
  Administered 2015-05-27: 1250 mg via INTRAVENOUS
  Filled 2015-05-27 (×3): qty 1250

## 2015-05-27 MED ORDER — HYDROCERIN EX CREA
1.0000 "application " | TOPICAL_CREAM | Freq: Every day | CUTANEOUS | Status: DC
  Administered 2015-05-27 – 2015-05-30 (×4): 1 via TOPICAL
  Filled 2015-05-27: qty 113

## 2015-05-27 MED ORDER — PIPERACILLIN-TAZOBACTAM 3.375 G IVPB
3.3750 g | Freq: Three times a day (TID) | INTRAVENOUS | Status: DC
Start: 2015-05-27 — End: 2015-05-29
  Administered 2015-05-27 – 2015-05-29 (×5): 3.375 g via INTRAVENOUS
  Filled 2015-05-27 (×9): qty 50

## 2015-05-27 MED ORDER — CHLORHEXIDINE GLUCONATE 0.12 % MT SOLN
15.0000 mL | Freq: Two times a day (BID) | OROMUCOSAL | Status: DC
  Administered 2015-05-28 – 2015-05-30 (×4): 15 mL via OROMUCOSAL

## 2015-05-27 MED ORDER — SODIUM CHLORIDE 0.9 % IV BOLUS (SEPSIS)
30.0000 mL/kg | Freq: Once | INTRAVENOUS | Status: AC
  Administered 2015-05-27: 3537 mL via INTRAVENOUS

## 2015-05-27 MED ORDER — SIMETHICONE 80 MG PO CHEW: 80.0000 mg | CHEWABLE_TABLET | Freq: Four times a day (QID) | ORAL | Status: DC | PRN

## 2015-05-27 MED ORDER — INSULIN ASPART 100 UNIT/ML ~~LOC~~ SOLN
0.0000 [IU] | Freq: Every day | SUBCUTANEOUS | Status: DC
  Administered 2015-05-27 – 2015-05-28 (×2): 2 [IU] via SUBCUTANEOUS
  Filled 2015-05-27 (×2): qty 2

## 2015-05-27 MED ORDER — SIMVASTATIN 10 MG PO TABS
10.0000 mg | ORAL_TABLET | Freq: Every day | ORAL | Status: DC
  Administered 2015-05-28 – 2015-05-30 (×3): 10 mg via ORAL
  Filled 2015-05-27 (×5): qty 1

## 2015-05-27 MED ORDER — DOCUSATE SODIUM 100 MG PO CAPS: 100.0000 mg | ORAL_CAPSULE | Freq: Two times a day (BID) | ORAL | Status: DC | PRN

## 2015-05-27 MED ORDER — PANTOPRAZOLE SODIUM 40 MG PO TBEC
40.0000 mg | DELAYED_RELEASE_TABLET | Freq: Every day | ORAL | Status: DC
  Administered 2015-05-28 – 2015-05-30 (×3): 40 mg via ORAL
  Filled 2015-05-27 (×3): qty 1

## 2015-05-27 MED ORDER — BUPROPION HCL ER (XL) 150 MG PO TB24
150.0000 mg | ORAL_TABLET | Freq: Every day | ORAL | Status: DC
  Administered 2015-05-28 – 2015-05-30 (×3): 150 mg via ORAL
  Filled 2015-05-27 (×4): qty 1

## 2015-05-27 MED ORDER — GUAIFENESIN ER 600 MG PO TB12: 600.0000 mg | ORAL_TABLET | Freq: Three times a day (TID) | ORAL | Status: DC | PRN

## 2015-05-27 MED ORDER — ASPIRIN 81 MG PO CHEW
81.0000 mg | CHEWABLE_TABLET | Freq: Every day | ORAL | Status: DC
  Administered 2015-05-28 – 2015-05-30 (×3): 81 mg via ORAL
  Filled 2015-05-27 (×3): qty 1

## 2015-05-27 MED ORDER — CETYLPYRIDINIUM CHLORIDE 0.05 % MT LIQD
7.0000 mL | Freq: Two times a day (BID) | OROMUCOSAL | Status: DC
  Administered 2015-05-28 – 2015-05-29 (×4): 7 mL via OROMUCOSAL

## 2015-05-27 MED ORDER — VANCOMYCIN HCL IN DEXTROSE 1-5 GM/200ML-% IV SOLN
1000.0000 mg | Freq: Once | INTRAVENOUS | Status: AC
  Administered 2015-05-27: 1000 mg via INTRAVENOUS
  Filled 2015-05-27: qty 200

## 2015-05-27 MED ORDER — GABAPENTIN 100 MG PO CAPS: 100.0000 mg | ORAL_CAPSULE | ORAL | Status: DC

## 2015-05-27 MED ORDER — DULOXETINE HCL 60 MG PO CPEP
60.0000 mg | ORAL_CAPSULE | Freq: Every day | ORAL | Status: DC
  Administered 2015-05-28 – 2015-05-30 (×3): 60 mg via ORAL
  Filled 2015-05-27 (×3): qty 1

## 2015-05-27 MED ORDER — GABAPENTIN 100 MG PO CAPS
200.0000 mg | ORAL_CAPSULE | Freq: Two times a day (BID) | ORAL | Status: DC
  Administered 2015-05-28 – 2015-05-30 (×6): 200 mg via ORAL
  Filled 2015-05-27 (×6): qty 2

## 2015-05-27 MED ORDER — AMIODARONE HCL 200 MG PO TABS
200.0000 mg | ORAL_TABLET | Freq: Every day | ORAL | Status: DC
  Administered 2015-05-28 – 2015-05-30 (×3): 200 mg via ORAL
  Filled 2015-05-27 (×3): qty 1

## 2015-05-27 MED ORDER — ROPINIROLE HCL 1 MG PO TABS
2.0000 mg | ORAL_TABLET | Freq: Every day | ORAL | Status: DC
  Administered 2015-05-28 – 2015-05-30 (×3): 2 mg via ORAL
  Filled 2015-05-27 (×3): qty 2

## 2015-05-27 NOTE — ED Notes (Signed)
Pt arrived via EMS from [facility] reporting generalized abd pain beginning this morning. Pt reports nausea but denies vomiting. Pt has a hx of diverticulitis and reports the pain is similar.

## 2015-05-27 NOTE — H&P (Signed)
[facility]   PATIENT NAME: [name]    MR#:  [id number]  DATE OF BIRTH:  11/03/1940  DATE OF ADMISSION:  05/27/2015  PRIMARY CARE PHYSICIAN: Lorie Phenix, MD   REQUESTING/REFERRING PHYSICIAN: Dr. Loleta Rose  CHIEF COMPLAINT:   Chief Complaint  Patient presents with  . Abdominal Pain    HISTORY OF PRESENT ILLNESS:  [name]  is a 74 y.o. female with a known history of hypertension, diabetes, sleep apnea, COPD on home oxygen, atrial fibrillation and CAD, recent admission in the past year for sepsis requiring ICU stay and also intubation at the time presents to the hospital from [facility] secondary to abdominal pain and noted to be septic again. Agent is a very poor historian and most of the history is obtained from old records and also patient's daughter. Patient has been complaining of breathing difficulties since yesterday worsened this morning so brought to the hospital. She also had some abdominal pain. Noted to be hypotensive. She also had a temperature as high as 104F rectally. Her urine looks like it is infected and patient is being admitted for sepsis. He complains of nausea but no vomiting. Her abdominal pain is much improved at this time. She has been a resident of [facility] for almost 4 years now. She is wheelchair bound at baseline.  PAST MEDICAL HISTORY:   Past Medical History  Diagnosis Date  . Thyroid disease   . Depression   . Hypertension   . Lumbar disc disease   . Cervical disc disease   . Dyslipidemia   . History of ETOH abuse     dependence with withdrawl seizures  . MI, acute, non ST segment elevation   . Sleep apnea   . Neuropathy   . Anemia   . GERD (gastroesophageal reflux disease)   . Diabetes mellitus without complication   . Restless leg syndrome   . Unspecified psychosis   . CHF (congestive heart failure)   . COPD (chronic obstructive pulmonary disease)   . Atrial  fibrillation   . Acute respiratory failure   . Hyperlipidemia     PAST SURGICAL HISTORY:   Past Surgical History  Procedure Laterality Date  . Cervical disc surgery  03/2011    [facility]  . Thyroidectomy    . Tonsillectomy    . Replacement total knee      right knee  . Cardiac catheterization  05/2011    [facility] by Dr Mariah Milling  . Coronary angioplasty  05/2011    stent  . Cardioversion    . Colonoscopy      SOCIAL HISTORY:   Social History  Substance Use Topics  . Smoking status: Current Every Day Smoker -- 0.50 packs/day for 54 years    Types: Cigarettes  . Smokeless tobacco: Never Used  . Alcohol Use: No    FAMILY HISTORY:   Family History  Problem Relation Age of Onset  . Stroke Mother   . Cancer Father   . Hepatitis Brother     b    DRUG ALLERGIES:   Allergies  Allergen Reactions  . Amiodarone     Intolerance   . Lisinopril Other (See Comments)    Unknown reaction  . Latex Itching and Rash    REVIEW OF SYSTEMS:   Review of Systems  Unable to perform ROS: critical illness    MEDICATIONS AT HOME:   Prior to Admission medications   Medication Sig Start Date  End Date Taking? Authorizing Provider  acetaminophen (TYLENOL) 325 MG tablet Take 650 mg by mouth 3 (three) times daily.    Yes Historical Provider, MD  amiodarone (PACERONE) 200 MG tablet Take 200 mg by mouth daily.    Yes Historical Provider, MD  aspirin 81 MG chewable tablet Chew 81 mg by mouth daily.   Yes Historical Provider, MD  buPROPion (WELLBUTRIN XL) 150 MG 24 hr tablet Take 150 mg by mouth daily.   Yes Historical Provider, MD  Cholecalciferol (VITAMIN D3) 50000 UNITS CAPS Take 50,000 Units by mouth once a week. Take on Sundays.   Yes Historical Provider, MD  clobetasol cream (TEMOVATE) 0.05 % Apply 1 application topically every 12 (twelve) hours as needed (for hands/dryness. Apply to groin and hands.).    Yes Historical Provider, MD  docusate sodium (COLACE) 100 MG capsule Take 100 mg by  mouth every 12 (twelve) hours as needed for mild constipation or moderate constipation.    Yes Historical Provider, MD  DULoxetine (CYMBALTA) 60 MG capsule Take 60 mg by mouth daily.    Yes Historical Provider, MD  Fluticasone-Salmeterol (ADVAIR) 250-50 MCG/DOSE AEPB Inhale 1 puff into the lungs 2 (two) times daily.   Yes Historical Provider, MD  furosemide (LASIX) 20 MG tablet Take 40 mg by mouth 2 (two) times daily. Take on Monday, Tuesday, Wednesday, Thursday, Friday, and Saturday.   Yes Historical Provider, MD  gabapentin (NEURONTIN) 100 MG capsule Take 100-200 mg by mouth See admin instructions. Take 2 capsules (200mg ) orally in the morning, Take 1 capsule orally in the afternoon, and Take 2 capsules (200mg ) orally at bedtime.   Yes Historical Provider, MD  guaifenesin (HUMIBID E) 400 MG TABS tablet Take 400 mg by mouth every 8 (eight) hours as needed (for cough.).   Yes Historical Provider, MD  hydrocerin (EUCERIN) CREA Apply 1 application topically at bedtime. Apply to bilateral lower extremities.   Yes Historical Provider, MD  ipratropium-albuterol (DUONEB) 0.5-2.5 (3) MG/3ML SOLN Take 3 mLs by nebulization every 6 (six) hours as needed (for congestion.).   Yes Historical Provider, MD  levothyroxine (SYNTHROID, LEVOTHROID) 150 MCG tablet Take 150 mcg by mouth daily.    Yes Historical Provider, MD  loperamide (IMODIUM A-D) 2 MG tablet Take 2 mg by mouth every 6 (six) hours as needed for diarrhea or loose stools.    Yes Historical Provider, MD  magnesium oxide (MAG-OX) 400 MG tablet Take 400 mg by mouth daily.   Yes Historical Provider, MD  Melatonin 5 MG TABS Take 5 mg by mouth at bedtime.   Yes Historical Provider, MD  metFORMIN (GLUCOPHAGE) 500 MG tablet Take 250 mg by mouth daily.    Yes Historical Provider, MD  metoprolol (LOPRESSOR) 50 MG tablet Take 25 mg by mouth daily.   Yes Historical Provider, MD  montelukast (SINGULAIR) 10 MG tablet Take 10 mg by mouth daily.    Yes Historical  Provider, MD  Omeprazole 20 MG TBEC Take 20 mg by mouth 2 (two) times daily.   Yes Historical Provider, MD  ondansetron (ZOFRAN-ODT) 4 MG disintegrating tablet Take 4 mg by mouth every 6 (six) hours as needed for nausea or vomiting.   Yes Historical Provider, MD  OxyCODONE HCl, Abuse Deter, (OXAYDO) 5 MG TABA Take 5 mg by mouth every 4 (four) hours as needed (for moderate to severe pain.).   Yes Historical Provider, MD  POLYETHYLENE GLYCOL 3350 PO Take 17 g by mouth at bedtime.   Yes Historical Provider, MD  polyvinyl alcohol (LIQUITEARS) 1.4 % ophthalmic solution Place 1 drop into both eyes every 6 (six) hours as needed for dry eyes.   Yes Historical Provider, MD  potassium chloride SA (K-DUR,KLOR-CON) 20 MEQ tablet Take 20 mEq by mouth daily. Take on Monday, Tuesday, Wednesday, Thursday, Friday, and Saturday.   Yes Historical Provider, MD  Rivaroxaban (XARELTO) 20 MG TABS tablet Take 20 mg by mouth daily.    Yes Historical Provider, MD  rOPINIRole (REQUIP) 1 MG tablet Take 1 mg by mouth daily.    Yes Historical Provider, MD  rOPINIRole (REQUIP) 2 MG tablet Take 2 mg by mouth at bedtime.   Yes Historical Provider, MD  Simethicone 80 MG TABS Take 80 mg by mouth every 6 (six) hours as needed (for flatulence.).   Yes Historical Provider, MD  simvastatin (ZOCOR) 10 MG tablet Take 10 mg by mouth at bedtime.    Yes Historical Provider, MD  tiotropium (SPIRIVA) 18 MCG inhalation capsule Place 18 mcg into inhaler and inhale daily.   Yes Historical Provider, MD  vitamin C (ASCORBIC ACID) 500 MG tablet Take 500 mg by mouth 2 (two) times daily.    Yes Historical Provider, MD      VITAL SIGNS:  Blood pressure 80/50, pulse 83, temperature 102.4 F (39.1 C), temperature source Rectal, resp. rate 20, height 5\' 6"  (1.676 m), weight 117.935 kg (260 lb), SpO2 94 %.  PHYSICAL EXAMINATION:   Physical Exam  GENERAL:  74 y.o.-year-old critically ill obese patient lying in the bed with no acute distress.  EYES:  Pupils equal, round, reactive to light and accommodation. No scleral icterus. Extraocular muscles intact.  HEENT: Head atraumatic, normocephalic. Oropharynx and nasopharynx clear.  NECK:  Supple, no jugular venous distention. No thyroid enlargement, no tenderness.  LUNGS: Lungs very tight to auscultation. Significant expiratory wheeze anteriorly and posteriorly on exam. No crackles heard. Minimal use of accessory muscles on breathing noted.  CARDIOVASCULAR: S1, S2 normal. No murmurs, rubs, or gallops.  ABDOMEN: Soft, nontender, nondistended. Obese abdomen present. Bowel sounds present. No organomegaly or mass.  EXTREMITIES: No pedal edema, cyanosis, or clubbing.  NEUROLOGIC: Cranial nerves II through XII are intact. Muscle strength 4/5 in all extremities. Generalized weakness noted. Patient is bedbound. Sensation intact. Gait not checked.  PSYCHIATRIC: The patient is alert and oriented x 2.  SKIN: No obvious rash, lesion, or ulcer.   LABORATORY PANEL:   CBC  Recent Labs Lab 05/27/15 1423  WBC 19.7*  HGB 11.6*  HCT 36.5  PLT 250   ------------------------------------------------------------------------------------------------------------------  Chemistries   Recent Labs Lab 05/27/15 1423  NA 138  K 4.6  CL 99*  CO2 29  GLUCOSE 152*  BUN 36*  CREATININE 1.28*  CALCIUM 9.0  AST 26  ALT 12*  ALKPHOS 95  BILITOT 0.7   ------------------------------------------------------------------------------------------------------------------  Cardiac Enzymes  Recent Labs Lab 05/27/15 1423  TROPONINI 0.03   ------------------------------------------------------------------------------------------------------------------  RADIOLOGY:  Dg Chest Port 1 View  05/27/2015   CLINICAL DATA:  Patient complaining of abdominal pain, and currently is complaining of suprapubic discomfort. She's had nausea without vomiting. She didn't know she's had a fever. She has had sweats. She has  baseline shortness of breath, and no new cough. Pain is mild to moderate. She has had some dysuria. Complains her mouth is very dry. hx of hypertension, GERD,CHF, and COPD, and acute respiratory failure.  EXAM: PORTABLE CHEST - 1 VIEW  COMPARISON:  01/13/2015  FINDINGS: Cardiac silhouette is normal in size. The aorta is  mildly uncoiled. No mediastinal or hilar masses or evidence of adenopathy.  Mild thickening along the minor fissure. Lungs otherwise clear. No pleural effusion or pneumothorax.  Bony thorax is demineralized. There are changes from a previous anterior cervical spine fusion.  IMPRESSION: No acute cardiopulmonary disease.   Electronically Signed   By: Amie Portland M.D.   On: 05/27/2015 16:03    EKG:   Orders placed or performed during the hospital encounter of 05/27/15  . ED EKG 12-Lead  . ED EKG 12-Lead    IMPRESSION AND PLAN:   [name]  is a 74 y.o. female with a known history of hypertension, diabetes, sleep apnea, COPD on home oxygen, atrial fibrillation and CAD, recent admission in the past year for sepsis requiring ICU stay and also intubation at the time presents to the hospital from [facility] secondary to abdominal pain and noted to be septic again.  #1 severe sepsis-blood cultures and urine cultures have been sent for. -Started on IV Vanco and Zosyn-masseter dose antibiotics -Likely source is urinary tract infection. -IV levo fed if blood pressure remains low. She did get fluid bolus but due to her history of congestive heart failure will do gentle hydration at this time.  #2 with renal failure-ATN from sepsis. -Gentle hydration and IV pressors if needed too.  #3 acute on chronic COPD exacerbation-on chronic home oxygen, continue for now IV steroids and DuoNeb's ordered. -Pulmonary consult. -If respiratory status worsens, daughter wants patient to be intubated and and confirms she is a full code. Daughter is hopeful since patient had a similar presentation  last admission and was extubated successfully. -Continue her home inhalers  #4 atrial fibrillation-rate controlled at this time. -Hold metoprolol due to hypotension -Continue amiodarone. Patient also on Xarelto for stroke prophylaxis.  #5 ongoing smoker-patient not in a situation to be counseled. Nicotrol or nicotine when she is more awake.  #6 DVT prophylaxis-patient on Xarelto    All the records are reviewed and case discussed with ED provider. Management plans discussed with the patient, family and they are in agreement.  CODE STATUS: Full code  TOTAL CRITICAL CARE TIME SPENT IN TAKING CARE OF THIS PATIENT: 60 minutes.    [name] M.D on 05/27/2015 at 5:56 PM  Between 7am to 6pm - Pager - [phone]  After 6pm go to [email or web address] - password [email or web address] [facility]  [facility]  Office  [phone]  CC: Primary care physician; Lorie Phenix, MD

## 2015-05-27 NOTE — Progress Notes (Signed)
ANTIBIOTIC CONSULT NOTE - INITIAL  Pharmacy Consult for Vancomycin Indication: rule out sepsis  Allergies  Allergen Reactions  . Amiodarone     Intolerance   . Lisinopril Other (See Comments)    Unknown reaction  . Latex Itching and Rash    Patient Measurements: Height:  (167.6 cm) Weight: 260 lb (117.935 kg) IBW/kg (Calculated) : 59.3 Adjusted Body Weight: 82.7 kg  Vital Signs: Temp: 98.4 F (36.9 C) (08/21 1934) Temp Source: Oral (08/21 1934) BP: 95/67 mmHg (08/21 1934) Pulse Rate: 87 (08/21 1934) Intake/Output from previous day:   Intake/Output from this shift:    Labs:  Recent Labs  05/27/15 1423  WBC 19.7*  HGB 11.6*  PLT 250  CREATININE 1.28*   Estimated Creatinine Clearance: 51.9 mL/min (by C-G formula based on Cr of 1.28). No results for input(s): VANCOTROUGH, VANCOPEAK, VANCORANDOM, GENTTROUGH, GENTPEAK, GENTRANDOM, TOBRATROUGH, TOBRAPEAK, TOBRARND, AMIKACINPEAK, AMIKACINTROU, AMIKACIN in the last 72 hours.   Microbiology: No results found for this or any previous visit (from the past 720 hour(s)).  Medical History: Past Medical History  Diagnosis Date  . Thyroid disease   . Depression   . Hypertension   . Lumbar disc disease   . Cervical disc disease   . Dyslipidemia   . History of ETOH abuse     dependence with withdrawl seizures  . MI, acute, non ST segment elevation   . Sleep apnea   . Neuropathy   . Anemia   . GERD (gastroesophageal reflux disease)   . Diabetes mellitus without complication   . Restless leg syndrome   . Unspecified psychosis   . CHF (congestive heart failure)   . COPD (chronic obstructive pulmonary disease)   . Atrial fibrillation   . Acute respiratory failure   . Hyperlipidemia     Medications:  Anti-infectives    Start     Dose/Rate Route Frequency Ordered Stop   05/28/15 0000  vancomycin (VANCOCIN) 1,250 mg in sodium chloride 0.9 % 250 mL IVPB     1,250 mg 166.7 mL/hr over 90 Minutes Intravenous Every  18 hours 05/27/15 1958     05/27/15 2200  piperacillin-tazobactam (ZOSYN) IVPB 3.375 g     3.375 g 12.5 mL/hr over 240 Minutes Intravenous 3 times per day 05/27/15 1918     05/27/15 1534  piperacillin-tazobactam (ZOSYN) 3.375 (3-0.375) G injection    Comments:  Tory Emerald: cabinet override      05/27/15 1534 05/28/15 0344   05/27/15 1515  vancomycin (VANCOCIN) IVPB 1000 mg/200 mL premix     1,000 mg 200 mL/hr over 60 Minutes Intravenous  Once 05/27/15 1503 05/27/15 1628   05/27/15 1515  piperacillin-tazobactam (ZOSYN) IVPB 3.375 g     3.375 g 100 mL/hr over 30 Minutes Intravenous  Once 05/27/15 1503 05/27/15 1628     Assessment: Patient is a 74yo female admitted for possible sepsis and UTI. Empirically started on Zosyn 3.375g IV q8h and Vancomycin.  Goal of Therapy:  Vancomycin trough level 15-20 mcg/ml  Plan:  Measure antibiotic drug levels at steady state Follow up culture results Will order Vancomycin  IV q18h starting 8hr after initial 1g dose for stacked dosing. Will check a trough level prior to 4th dose.  Clovia Cuff, PharmD, BCPS 05/27/2015 8:01 PM

## 2015-05-27 NOTE — ED Provider Notes (Signed)
-----------------------------------------   3:30 PM on 05/27/2015 -----------------------------------------   Blood pressure 73/61, pulse 83, temperature 102.4 F (39.1 C), temperature source Rectal, resp. rate 20, height  (1.676 m), weight 260 lb (117.935 kg), SpO2 94 %.  Assuming care from Dr. Shaune Pollack.  In short, [name] is a 74 y.o. female with a chief complaint of Abdominal Pain .  Refer to the original H&P for additional details.  The current plan of care is to follow-up urinalysis, chest x-ray, and reassess given that she is septic, hypotensive, and may need more aggressive management.  I assessed the patient myself and discussed with her and her daughter how tenuous her condition is at this point, we discussed the fluids that she needs versus her CHF, discussed the possibility of pulmonary edema, and I verified with the patient that she is a full code.  Given her persistent hypotension I will proceed with the plan for 30 mL/kg of normal saline but will be very cognizant that her respiratory status may worsen.  ----------------------------------------- 4:52 PM on 05/27/2015 -----------------------------------------  I checked on the patient several times and discussed our findings of urosepsis.  We have obtained a manual blood pressure which continues to be low at 80/50.  She remains alert and oriented and mentating well though she continues to be ill appearing.  I have discussed the case with the hospitalist.  She will assess the patient and I told her that if she feels it is necessary or she wants to start pressors then I will put in a central line.  Patient's mean arterial pressure hovers between 60 and 65.  ----------------------------------------- 5:21 PM on 05/27/2015 -----------------------------------------  Dr. Mack Hook has evaluated the patient and does not feel that I need to put in a central line at this time.  She will take the patient to the ICU and continue to  monitor and consult the [facility] if it becomes necessary to pursue additional lines.  I have checked on the patient again and she has a bit of increased respiratory wheezing so we have decreased the fluids to 170 mL per hour.  She is not struggling to breathe, has no retractions, is oxygenating adequately, and does not need BiPAP or intubation at this time.      CRITICAL CARE Performed by: Loleta Rose   Total critical care time: 30 minutes  Critical care time was exclusive of separately billable procedures and treating other patients.  Critical care was necessary to treat or prevent imminent or life-threatening deterioration.  Critical care was time spent personally by me on the following activities: development of treatment plan with patient and/or surrogate as well as nursing, discussions with consultants, evaluation of patient's response to treatment, examination of patient, obtaining history from patient or surrogate, ordering and performing treatments and interventions, ordering and review of laboratory studies, ordering and review of radiographic studies, pulse oximetry and re-evaluation of patient's condition.   Loleta Rose, MD 05/27/15 2358

## 2015-05-27 NOTE — ED Provider Notes (Addendum)
[facility] Emergency Department Provider Note   ____________________________________________  Time seen: 2:30 PM I have reviewed the triage vital signs and the triage nursing note.  HISTORY  Chief Complaint Abdominal Pain   Historian Patient  HPI [name] is a 74 y.o. female who arrived EMS from [facility] complaining of abdominal pain, and currently is complaining of suprapubic discomfort. She's had nausea without vomiting. She didn't know she's had a fever. She has had sweats. She has baseline shortness of breath, and no new cough. Pain is mild to moderate. She has had some dysuria. Complains her mouth is very dry.    Past Medical History  Diagnosis Date  . Thyroid disease   . Depression   . Hypertension   . Lumbar disc disease   . Cervical disc disease   . Dyslipidemia   . History of ETOH abuse     dependence with withdrawl seizures  . MI, acute, non ST segment elevation   . Sleep apnea   . Neuropathy   . Anemia   . GERD (gastroesophageal reflux disease)   . Diabetes mellitus without complication   . Restless leg syndrome   . Unspecified psychosis   . CHF (congestive heart failure)   . COPD (chronic obstructive pulmonary disease)   . Atrial fibrillation   . Acute respiratory failure   . Hyperlipidemia     Patient Active Problem List   Diagnosis Date Noted  . OSA on CPAP 04/11/2015  . Abdominal pain, unspecified site 10/25/2013  . Gallstone 10/25/2013  . Chronic diastolic CHF (congestive heart failure) 09/06/2013  . Edema 01/14/2012  . Hypotension 01/14/2012  . Atrial flutter 06/19/2011  . CAD (coronary artery disease) 06/19/2011  . Hyperlipidemia 06/19/2011  . Restless leg 06/19/2011  . Mental status change 06/19/2011    Past Surgical History  Procedure Laterality Date  . Cervical disc surgery  03/2011    [facility]  . Thyroidectomy    . Tonsillectomy    . Replacement total knee      right knee  . Cardiac  catheterization  05/2011    [facility] by Dr Mariah Milling  . Coronary angioplasty  05/2011    stent  . Cardioversion    . Colonoscopy      Current Outpatient Rx  Name  Route  Sig  Dispense  Refill  . acetaminophen (TYLENOL) 325 MG tablet   Oral   Take 650 mg by mouth every 6 (six) hours as needed.         Marland Kitchen albuterol (PROVENTIL) (2.5 MG/3ML) 0.083% nebulizer solution   Nebulization   Take 2.5 mg by nebulization every 4 (four) hours as needed for wheezing or shortness of breath.         Marland Kitchen amiodarone (PACERONE) 200 MG tablet   Oral   Take 200 mg by mouth 2 (two) times daily.         Marland Kitchen aspirin 81 MG tablet   Oral   Take 81 mg by mouth daily.         Marland Kitchen buPROPion (WELLBUTRIN SR) 150 MG 12 hr tablet   Oral   Take 150 mg by mouth 2 (two) times daily.         . Cholecalciferol (VITAMIN D3) 50000 UNITS CAPS   Oral   Take by mouth once a week.         . collagenase (SANTYL) ointment   Topical   Apply 1 application topically daily.         Marland Kitchen  docusate sodium (COLACE) 100 MG capsule   Oral   Take 100 mg by mouth every 12 (twelve) hours.         . DULoxetine (CYMBALTA) 60 MG capsule   Oral   Take 30 mg by mouth 2 (two) times daily.          . Fluticasone-Salmeterol (ADVAIR DISKUS IN)   Inhalation   Inhale 1 puff into the lungs 2 (two) times daily.         . furosemide (LASIX) 20 MG tablet   Oral   Take 40 mg by mouth 2 (two) times daily.         Marland Kitchen gabapentin (NEURONTIN) 100 MG capsule   Oral   Take 100 mg by mouth 3 (three) times daily.          Marland Kitchen guaiFENesin (MUCINEX) 600 MG 12 hr tablet   Oral   Take 1,200 mg by mouth every 12 (twelve) hours.         . hydrocerin (EUCERIN) CREA   Topical   Apply 1 application topically 2 (two) times daily.         . Hypromellose (ARTIFICIAL TEARS) 0.4 % SOLN   Ophthalmic   Apply to eye.         . levothyroxine (SYNTHROID, LEVOTHROID) 150 MCG tablet   Oral   Take 150 mcg by mouth daily before breakfast.          . loperamide (IMODIUM A-D) 2 MG tablet   Oral   Take 2 mg by mouth 4 (four) times daily as needed for diarrhea or loose stools.         . Magnesium 400 MG CAPS   Oral   Take 400 mg by mouth daily.         . Melatonin 1 MG TABS   Oral   Take by mouth at bedtime.         . metFORMIN (GLUCOPHAGE) 500 MG tablet   Oral   Take 250 mg by mouth daily with breakfast.          . metoprolol tartrate (LOPRESSOR) 25 MG tablet   Oral   Take 25 mg by mouth daily.          . montelukast (SINGULAIR) 10 MG tablet   Oral   Take 10 mg by mouth at bedtime.         . Multiple Vitamin (MULTIVITAMIN) tablet   Oral   Take 1 tablet by mouth daily.           Marland Kitchen omeprazole (PRILOSEC) 20 MG capsule   Oral   Take 20 mg by mouth daily.         . ondansetron (ZOFRAN) 4 MG tablet   Oral   Take 4 mg by mouth every 6 (six) hours as needed for nausea or vomiting.         Marland Kitchen oxyCODONE-acetaminophen (PERCOCET) 5-325 MG per tablet   Oral   Take 1 tablet by mouth every 4 (four) hours as needed.           . potassium chloride (K-DUR,KLOR-CON) 10 MEQ tablet   Oral   Take 20 mEq by mouth daily.          . Probiotic Product (ALIGN) 4 MG CAPS   Oral   Take 4 mg by mouth at bedtime.         . Rivaroxaban (XARELTO) 20 MG TABS tablet   Oral   Take 20 mg  by mouth daily with supper.         Marland Kitchen rOPINIRole (REQUIP) 1 MG tablet   Oral   Take 1 mg by mouth daily.          Marland Kitchen rOPINIRole (REQUIP) 2 MG tablet   Oral   Take 2 mg by mouth at bedtime.         . simvastatin (ZOCOR) 10 MG tablet   Oral   Take 10 mg by mouth daily.         . tamsulosin (FLOMAX) 0.4 MG CAPS capsule   Oral   Take 0.4 mg by mouth daily.         Marland Kitchen tiotropium (SPIRIVA) 18 MCG inhalation capsule   Inhalation   Place 18 mcg into inhaler and inhale daily.         . vitamin C (ASCORBIC ACID) 500 MG tablet   Oral   Take 500 mg by mouth daily.           Allergies Amiodarone and  Latex  Family History  Problem Relation Age of Onset  . Stroke Mother   . Cancer Father   . Hepatitis Brother     b    Social History Social History  Substance Use Topics  . Smoking status: Current Every Day Smoker -- 0.50 packs/day for 54 years    Types: Cigarettes  . Smokeless tobacco: Never Used  . Alcohol Use: No    Review of Systems  Constitutional: positive for generalized weakness Eyes: Negative for visual changes. ENT: Negative for sore throat. Cardiovascular: Negative for chest pain. Respiratory: chronic shortness of breath. Gastrointestinal: Negative for diarrhea. Genitourinary: positivefor dysuria. Musculoskeletal: Negative for back pain. Skin: Negative for rash. Neurological: Negative for headache. 10 point Review of Systems otherwise negative ____________________________________________   PHYSICAL EXAM:  VITAL SIGNS: ED Triage Vitals  Enc Vitals Group     BP 05/27/15 1420 99/65 mmHg     Pulse Rate 05/27/15 1420 88     Resp 05/27/15 1420 20     Temp 05/27/15 1420 104.2 F (40.1 C)     Temp Source 05/27/15 1420 Rectal     SpO2 05/27/15 1420 96 %     Weight 05/27/15 1415 260 lb (117.935 kg)     Height 05/27/15 1415 5\' 6"  (1.676 m)     Head Cir --      Peak Flow --      Pain Score 05/27/15 1415 8     Pain Loc --      Pain Edu? --      Excl. in GC? --      Constitutional: Alert and oriented. Well appearing and in no distress. Eyes: Conjunctivae are normal. PERRL. Normal extraocular movements. ENT   Head: Normocephalic and atraumatic.   Nose: No congestion/rhinnorhea.   Mouth/Throat: Mucous membranes are very dry..   Neck: No stridor. Cardiovascular/Chest: Normal rate, regular rhythm.  No murmurs, rubs, or gallops. Respiratory: Normal respiratory effort without tachypnea nor retractions. Mild rhonchi and rales posterior bases bilaterally. Gastrointestinal: Soft. No distention, no guarding, no rebound. Morbidly obese. Mild  tenderness lower abdomen and left-sided.  Genitourinary/rectal:Deferred Musculoskeletal: Nontender with normal range of motion in all extremities. No joint effusions.  No lower extremity tenderness.  No edema. Neurologic:  Normal speech and language. No gross or focal neurologic deficits are appreciated. Skin:  Skin is warm, dry and intact. No rash noted. Psychiatric: Mood and affect are normal. Speech and behavior are normal. Patient exhibits appropriate  insight and judgment.  ____________________________________________   EKG I, Governor Rooks, MD, the attending physician have personally viewed and interpreted all ECGs.  80 bpm. Normal sinus rhythm. Narrow QRS. Normal axis. Nonspecific T wave. ____________________________________________  LABS (pertinent positives/negatives)  White blood count 19.7 with left shift. Hemoglobin 1.6, platelets 250 Lactate acid 1.3 Complete metabolic panel significant for BUN 36 and creatinine 1.28 Urinalysis pending Troponin 0.03 Lipase 12 ____________________________________________  RADIOLOGY All Xrays were viewed by me. Imaging interpreted by Radiologist.  Chest x-ray portable one view pending __________________________________________  PROCEDURES  Procedure(s) performed: None  Critical Care performed: CRITICAL CARE Performed by: Governor Rooks   Total critical care time: 30 minutes  Critical care time was exclusive of separately billable procedures and treating other patients.  Critical care was necessary to treat or prevent imminent or life-threatening deterioration.  Critical care was time spent personally by me on the following activities: development of treatment plan with patient and/or surrogate as well as nursing, discussions with consultants, evaluation of patient's response to treatment, examination of patient, obtaining history from patient or surrogate, ordering and performing treatments and interventions, ordering and review  of laboratory studies, ordering and review of radiographic studies, pulse oximetry and re-evaluation of patient's condition.   ____________________________________________   ED COURSE / ASSESSMENT AND PLAN  CONSULTATIONS: None  Pertinent labs & imaging results that were available during my care of the patient were reviewed by me and considered in my medical decision making (see chart for details).   Patient arrived febrile and hypotensive, raising concern for sepsis. Source may be intra-abdominal, and nurse noted possibly foul-smelling urine.  Antibiotic sore initiated for sepsis including vancomycin and Zosyn. After 1 L normal saline, patient's blood pressure was near 90. Fluid resuscitation will be monitored carefully due to her history of CHF. I do not want to just blindly give 30 cc/kg in this patient for sepsis. Her lactate is reassuring at 1.3.nurse is going to give 500 cc by 500 cc until blood pressure stable above systolic 100.  White blood cell count elevated with left shift. Patient ALSO found to have mild acute renal failure.  Patient care transferred to Dr.Forbach at shift change. Urinalysis, troponin, and lipase are pending.  Patient / Family / Caregiver informed of clinical course, medical decision-making process, and agree with plan.    ___________________________________________   FINAL CLINICAL IMPRESSION(S) / ED DIAGNOSES   Final diagnoses:  Sepsis due to other etiology  Acute renal failure, unspecified acute renal failure type       Governor Rooks, MD 05/27/15 1535  Governor Rooks, MD 05/27/15 1537

## 2015-05-27 NOTE — ED Notes (Signed)
Pt has received of fluid since arrival to ED. Pts expiratory wheezing has increased adn pt states increased work of breathing. Fluids decreased to 115mL/hour and MD notified.

## 2015-05-28 DIAGNOSIS — N390 Urinary tract infection, site not specified: Secondary | ICD-10-CM

## 2015-05-28 DIAGNOSIS — G4733 Obstructive sleep apnea (adult) (pediatric): Secondary | ICD-10-CM

## 2015-05-28 DIAGNOSIS — J449 Chronic obstructive pulmonary disease, unspecified: Secondary | ICD-10-CM

## 2015-05-28 DIAGNOSIS — R319 Hematuria, unspecified: Secondary | ICD-10-CM

## 2015-05-28 DIAGNOSIS — A4151 Sepsis due to Escherichia coli [E. coli]: Secondary | ICD-10-CM

## 2015-05-28 LAB — BASIC METABOLIC PANEL
ANION GAP: 10 (ref 5–15)
BUN: 30 mg/dL — ABNORMAL HIGH (ref 6–20)
CALCIUM: 8.9 mg/dL (ref 8.9–10.3)
CO2: 28 mmol/L (ref 22–32)
Chloride: 102 mmol/L (ref 101–111)
Creatinine, Ser: 1.09 mg/dL — ABNORMAL HIGH (ref 0.44–1.00)
GFR calc Af Amer: 57 mL/min — ABNORMAL LOW (ref >60)
GFR calc non Af Amer: 49 mL/min — ABNORMAL LOW (ref >60)
GLUCOSE: 274 mg/dL — AB (ref 65–99)
POTASSIUM: 3.8 mmol/L (ref 3.5–5.1)
Sodium: 140 mmol/L (ref 135–145)

## 2015-05-28 LAB — GLUCOSE, CAPILLARY
GLUCOSE-CAPILLARY: 213 mg/dL — AB (ref 65–99)
Glucose-Capillary: 221 mg/dL — ABNORMAL HIGH (ref 65–99)
Glucose-Capillary: 201 mg/dL — ABNORMAL HIGH (ref 65–99)
Glucose-Capillary: 250 mg/dL — ABNORMAL HIGH (ref 65–99)

## 2015-05-28 LAB — CBC
HEMATOCRIT: 36.2 % (ref 35.0–47.0)
HEMOGLOBIN: 11.7 g/dL — AB (ref 12.0–16.0)
MCH: 27.3 pg (ref 26.0–34.0)
MCHC: 32.2 g/dL (ref 32.0–36.0)
MCV: 84.8 fL (ref 80.0–100.0)
Platelets: 262 10*3/uL (ref 150–440)
RBC: 4.27 MIL/uL (ref 3.80–5.20)
RDW: 17.2 % — ABNORMAL HIGH (ref 11.5–14.5)
WBC: 13 10*3/uL — ABNORMAL HIGH (ref 3.6–11.0)

## 2015-05-28 LAB — HEMOGLOBIN A1C: Hgb A1c MFr Bld: 6.7 % — ABNORMAL HIGH (ref 4.0–6.0)

## 2015-05-28 MED ORDER — IPRATROPIUM-ALBUTEROL 0.5-2.5 (3) MG/3ML IN SOLN
3.0000 mL | RESPIRATORY_TRACT | Status: DC | PRN
  Administered 2015-05-29: 3 mL via RESPIRATORY_TRACT
  Filled 2015-05-28 (×2): qty 3

## 2015-05-28 MED ORDER — MOMETASONE FURO-FORMOTEROL FUM 100-5 MCG/ACT IN AERO
2.0000 | INHALATION_SPRAY | Freq: Two times a day (BID) | RESPIRATORY_TRACT | Status: DC
  Administered 2015-05-28 – 2015-05-30 (×5): 2 via RESPIRATORY_TRACT
  Filled 2015-05-28: qty 8.8

## 2015-05-28 MED ORDER — IPRATROPIUM-ALBUTEROL 0.5-2.5 (3) MG/3ML IN SOLN
3.0000 mL | Freq: Four times a day (QID) | RESPIRATORY_TRACT | Status: AC
  Administered 2015-05-28 – 2015-05-29 (×4): 3 mL via RESPIRATORY_TRACT
  Filled 2015-05-28 (×4): qty 3

## 2015-05-28 MED ORDER — LACTULOSE 10 GM/15ML PO SOLN
30.0000 g | Freq: Once | ORAL | Status: AC
  Administered 2015-05-28: 30 g via ORAL
  Filled 2015-05-28: qty 60

## 2015-05-28 MED ORDER — PREDNISONE 20 MG PO TABS: 40.0000 mg | ORAL_TABLET | Freq: Every day | ORAL | Status: DC

## 2015-05-28 MED ORDER — TIOTROPIUM BROMIDE MONOHYDRATE 18 MCG IN CAPS
18.0000 ug | ORAL_CAPSULE | Freq: Every day | RESPIRATORY_TRACT | Status: DC
  Administered 2015-05-28 – 2015-05-30 (×3): 18 ug via RESPIRATORY_TRACT
  Filled 2015-05-28: qty 5

## 2015-05-28 NOTE — Clinical Social Work Note (Signed)
Clinical Social Work Assessment  Patient Details  Name: [name] MRN: [id number] Date of Birth: 06/05/1941  Date of referral:  05/28/15               Reason for consult:   (pt is from [employer])                Permission sought to share information with:  Facility Medical sales representative, Family Supports Permission granted to share information::  Yes, Verbal Permission Granted  Name::     Debbie [employer]::  [employer]  Relationship::  Daughter Debbie [employer] Information:     Housing/Transportation Living arrangements for the past 2 months:  Skilled Building surveyor of Information:  Patient Patient Interpreter Needed:  None Criminal Activity/Legal Involvement Pertinent to Current Situation/Hospitalization:  No - Comment as needed Significant Relationships:  Adult Children Lives with:  Facility Resident Do you feel safe going back to the place where you live?  Yes Need for family participation in patient care:  No (Coment)  Care giving concerns:  No concerns voiced at this time   Office manager / plan:  CSW spoke to pt.  She was alert and Ox4.  SHe confirmed that she was from [employer].  She is a resident and has been for 5 yrs.  She is also wheel chair bound and has been for 5 years.  Her adult daughter is her main contact.  Pt is in agreement with return to [facility] when medically stable.  Employment status:  Retired, Disabled (Comment on whether or not currently receiving Disability) Insurance information:  Medicare PT Recommendations:  Skilled Nursing Facility Information / Referral to community resources:     Patient/Family's Response to care:  Pt was in agreement with DC back to [facility] once medically stable   Patient/Family's Understanding of and Emotional Response to Diagnosis, Current Treatment, and Prognosis:  Pt was in agreement with DC back to [facility] once medically stable.  Pt voiced her understanding of  this.  Emotional Assessment Appearance:  Appears stated age Attitude/Demeanor/Rapport:   (unremarkable) Affect (typically observed):  Appropriate (Pt did continue to fall asleep during assessment.) Orientation:  Oriented to Self, Oriented to Place, Oriented to  Time, Oriented to Situation Alcohol / Substance use:  Never Used Psych involvement (Current and /or in the community):  No (Comment)  Discharge Needs  Concerns to be addressed:  Care Coordination Readmission within the last 30 days:  No Current discharge risk:  None Barriers to Discharge:  No Barriers Identified   Chauncy Passy, LCSW 05/28/2015, 3:49 PM

## 2015-05-28 NOTE — Progress Notes (Signed)
ANTIBIOTIC CONSULT NOTE - FOLLOW UP  Pharmacy Consult for Vancomycin Indication: rule out sepsis  Allergies  Allergen Reactions  . Amiodarone     Intolerance   . Lisinopril Other (See Comments)    Unknown reaction  . Latex Itching and Rash    Patient Measurements: Height: 5\' 6"  (167.6 cm) Weight: 260 lb (117.935 kg) IBW/kg (Calculated) : 59.3 Adjusted Body Weight: 82.7  Vital Signs: Temp: 98.4 F (36.9 C) (08/22 0900) BP: 109/69 mmHg (08/22 0900) Pulse Rate: 70 (08/22 0900) Intake/Output from previous day: 08/21 0701 - 08/22 0700 In: 122.1 [I.V.:122.1] Out: 2075 [Urine:2075] Intake/Output from this shift:    Labs:  Recent Labs  05/27/15 1423 05/28/15 0635  WBC 19.7* 13.0*  HGB 11.6* 11.7*  PLT 250 262  CREATININE 1.28* 1.09*   Estimated Creatinine Clearance: 60.9 mL/min (by C-G formula based on Cr of 1.09). No results for input(s): VANCOTROUGH, VANCOPEAK, VANCORANDOM, GENTTROUGH, GENTPEAK, GENTRANDOM, TOBRATROUGH, TOBRAPEAK, TOBRARND, AMIKACINPEAK, AMIKACINTROU, AMIKACIN in the last 72 hours.   Microbiology: Recent Results (from the past 720 hour(s))  Urine culture     Status: None (Preliminary result)   Collection Time: 05/27/15  2:23 PM  Result Value Ref Range Status   Specimen Description URINE, RANDOM  Final   Special Requests NONE  Final   Culture   Final    >=100,000 COLONIES/mL ESCHERICHIA COLI SUSCEPTIBILITIES TO FOLLOW    Report Status PENDING  Incomplete  Blood Culture (routine x 2)     Status: None (Preliminary result)   Collection Time: 05/27/15  2:36 PM  Result Value Ref Range Status   Specimen Description BLOOD RIGHT ARM  Final   Special Requests   Final    BOTTLES DRAWN AEROBIC AND ANAEROBIC  AER 16CC ANA 6CC   Culture  Setup Time   Final    GRAM NEGATIVE RODS IN BOTH AEROBIC AND ANAEROBIC BOTTLES CRITICAL VALUE NOTED.  VALUE IS CONSISTENT WITH PREVIOUSLY REPORTED AND CALLED VALUE.    Culture GRAM NEGATIVE RODS IDENTIFICATION TO  FOLLOW   Final   Report Status PENDING  Incomplete  Blood Culture (routine x 2)     Status: None (Preliminary result)   Collection Time: 05/27/15  2:57 PM  Result Value Ref Range Status   Specimen Description BLOOD RIGHT ARM  Final   Special Requests BAA,10ML,ANA,AER  Final   Culture   Final    GRAM NEGATIVE RODS IN BOTH AEROBIC AND ANAEROBIC BOTTLES CRITICAL RESULT CALLED TO, READ BACK BY AND VERIFIED WITH: BYRUM HADDOCK @ 2342 8.21.16 MPG CONFIRMED BY [facility]    Report Status PENDING  Incomplete  MRSA PCR Screening     Status: None   Collection Time: 05/27/15  7:15 PM  Result Value Ref Range Status   MRSA by PCR NEGATIVE NEGATIVE Final    Comment:        The GeneXpert MRSA Assay (FDA approved for NASAL specimens only), is one component of a comprehensive MRSA colonization surveillance program. It is not intended to diagnose MRSA infection nor to guide or monitor treatment for MRSA infections.     Anti-infectives    Start     Dose/Rate Route Frequency Ordered Stop   05/28/15 0000  vancomycin (VANCOCIN) 1,250 mg in sodium chloride 0.9 % 250 mL IVPB     1,250 mg 166.7 mL/hr over 90 Minutes Intravenous Every 18 hours 05/27/15 1958     05/27/15 2200  piperacillin-tazobactam (ZOSYN) IVPB 3.375 g     3.375 g 12.5 mL/hr over  240 Minutes Intravenous 3 times per day 05/27/15 1918     05/27/15 1534  piperacillin-tazobactam (ZOSYN) 3.375 (3-0.375) G injection    Comments:  Tory Emerald: cabinet override      05/27/15 1534 05/27/15 1554   05/27/15 1515  vancomycin (VANCOCIN) IVPB 1000 mg/200 mL premix     1,000 mg 200 mL/hr over 60 Minutes Intravenous  Once 05/27/15 1503 05/27/15 1628   05/27/15 1515  piperacillin-tazobactam (ZOSYN) IVPB 3.375 g     3.375 g 100 mL/hr over 30 Minutes Intravenous  Once 05/27/15 1503 05/27/15 1628      Assessment: Patient is a 74yo female admitted for possible sepsis and UTI. Empirically started on Zosyn 3.375g IV q8h and Vancomycin.   Goal  of Therapy:  Vancomycin trough level 15-20 mcg/ml  Plan:  Will continue vancomycin 1250 mg iv q 18 hours and check a trough with the 4th dose. Will f/u renal funciton and culture results.   Luisa Hart D 05/28/2015,10:54 AM

## 2015-05-28 NOTE — Progress Notes (Addendum)
PULMONARY / CRITICAL CARE MEDICINE   Name: [name] MRN: [id number] DOB: 1941-07-04    ADMISSION DATE:  05/27/2015 CONSULTATION DATE:  05/28/15  REFERRING MD :  Dr. Nemiah Commander   CHIEF COMPLAINT:     Abdominal pain and confusion    HISTORY OF PRESENT ILLNESS    74 y.o. female with a known history of hypertension, diabetes, sleep apnea, COPD on home oxygen, OSA, atrial fibrillation and CAD, recent admission in the past year for sepsis requiring ICU stay and also intubation at the time presents to the hospital from [facility] secondary to abdominal pain and noted to be septic again. Patient was complaining of breathing difficulties since on 05/26/15 along with abdominal pain, leg swelling, confusion, therefore was brought to the hospital. She also had some abdominal pain. Noted to be hypotensive in the ER. She also had a temperature as high as 104F rectally. Her urine looked infected and patient was being admitted for sepsis, possible recurrent UTI. She has been a resident of [facility] for almost 4 years now. She is wheelchair bound at baseline.  Today she states that she is doing much better, no worsening abdominal pain and is alert to person, place, time.     PAST MEDICAL HISTORY    :  Past Medical History  Diagnosis Date  . Thyroid disease   . Depression   . Hypertension   . Lumbar disc disease   . Cervical disc disease   . Dyslipidemia   . History of ETOH abuse     dependence with withdrawl seizures  . MI, acute, non ST segment elevation   . Sleep apnea   . Neuropathy   . Anemia   . GERD (gastroesophageal reflux disease)   . Diabetes mellitus without complication   . Restless leg syndrome   . Unspecified psychosis   . CHF (congestive heart failure)   . COPD (chronic obstructive pulmonary disease)   . Atrial fibrillation   . Acute respiratory failure   . Hyperlipidemia    Past Surgical History  Procedure Laterality Date  . Cervical disc surgery   03/2011    [facility]  . Thyroidectomy    . Tonsillectomy    . Replacement total knee      right knee  . Cardiac catheterization  05/2011    [facility] by Dr Mariah Milling  . Coronary angioplasty  05/2011    stent  . Cardioversion    . Colonoscopy     Prior to Admission medications   Medication Sig Start Date End Date Taking? Authorizing Provider  acetaminophen (TYLENOL) 325 MG tablet Take 650 mg by mouth 3 (three) times daily.    Yes Historical Provider, MD  amiodarone (PACERONE) 200 MG tablet Take 200 mg by mouth daily.    Yes Historical Provider, MD  aspirin 81 MG chewable tablet Chew 81 mg by mouth daily.   Yes Historical Provider, MD  buPROPion (WELLBUTRIN XL) 150 MG 24 hr tablet Take 150 mg by mouth daily.   Yes Historical Provider, MD  Cholecalciferol (VITAMIN D3) 50000 UNITS CAPS Take 50,000 Units by mouth once a week. Take on Sundays.   Yes Historical Provider, MD  clobetasol cream (TEMOVATE) 0.05 % Apply 1 application topically every 12 (twelve) hours as needed (for hands/dryness. Apply to groin and hands.).    Yes Historical Provider, MD  docusate sodium (COLACE) 100 MG capsule Take 100 mg by mouth every 12 (twelve) hours as needed for mild constipation or moderate constipation.  Yes Historical Provider, MD  DULoxetine (CYMBALTA) 60 MG capsule Take 60 mg by mouth daily.    Yes Historical Provider, MD  Fluticasone-Salmeterol (ADVAIR) 250-50 MCG/DOSE AEPB Inhale 1 puff into the lungs 2 (two) times daily.   Yes Historical Provider, MD  furosemide (LASIX) 20 MG tablet Take 40 mg by mouth 2 (two) times daily. Take on Monday, Tuesday, Wednesday, Thursday, Friday, and Saturday.   Yes Historical Provider, MD  gabapentin (NEURONTIN) 100 MG capsule Take 100-200 mg by mouth See admin instructions. Take 2 capsules (200mg ) orally in the morning, Take 1 capsule orally in the afternoon, and Take 2 capsules (200mg ) orally at bedtime.   Yes Historical Provider, MD  guaifenesin (HUMIBID E) 400 MG TABS tablet  Take 400 mg by mouth every 8 (eight) hours as needed (for cough.).   Yes Historical Provider, MD  hydrocerin (EUCERIN) CREA Apply 1 application topically at bedtime. Apply to bilateral lower extremities.   Yes Historical Provider, MD  ipratropium-albuterol (DUONEB) 0.5-2.5 (3) MG/3ML SOLN Take 3 mLs by nebulization every 6 (six) hours as needed (for congestion.).   Yes Historical Provider, MD  levothyroxine (SYNTHROID, LEVOTHROID) 150 MCG tablet Take 150 mcg by mouth daily.    Yes Historical Provider, MD  loperamide (IMODIUM A-D) 2 MG tablet Take 2 mg by mouth every 6 (six) hours as needed for diarrhea or loose stools.    Yes Historical Provider, MD  magnesium oxide (MAG-OX) 400 MG tablet Take 400 mg by mouth daily.   Yes Historical Provider, MD  Melatonin 5 MG TABS Take 5 mg by mouth at bedtime.   Yes Historical Provider, MD  metFORMIN (GLUCOPHAGE) 500 MG tablet Take 250 mg by mouth daily.    Yes Historical Provider, MD  metoprolol (LOPRESSOR) 50 MG tablet Take 25 mg by mouth daily.   Yes Historical Provider, MD  montelukast (SINGULAIR) 10 MG tablet Take 10 mg by mouth daily.    Yes Historical Provider, MD  Omeprazole 20 MG TBEC Take 20 mg by mouth 2 (two) times daily.   Yes Historical Provider, MD  ondansetron (ZOFRAN-ODT) 4 MG disintegrating tablet Take 4 mg by mouth every 6 (six) hours as needed for nausea or vomiting.   Yes Historical Provider, MD  OxyCODONE HCl, Abuse Deter, (OXAYDO) 5 MG TABA Take 5 mg by mouth every 4 (four) hours as needed (for moderate to severe pain.).   Yes Historical Provider, MD  POLYETHYLENE GLYCOL 3350 PO Take 17 g by mouth at bedtime.   Yes Historical Provider, MD  polyvinyl alcohol (LIQUITEARS) 1.4 % ophthalmic solution Place 1 drop into both eyes every 6 (six) hours as needed for dry eyes.   Yes Historical Provider, MD  potassium chloride SA (K-DUR,KLOR-CON) 20 MEQ tablet Take 20 mEq by mouth daily. Take on Monday, Tuesday, Wednesday, Thursday, Friday, and Saturday.    Yes Historical Provider, MD  Rivaroxaban (XARELTO) 20 MG TABS tablet Take 20 mg by mouth daily.    Yes Historical Provider, MD  rOPINIRole (REQUIP) 1 MG tablet Take 1 mg by mouth daily.    Yes Historical Provider, MD  rOPINIRole (REQUIP) 2 MG tablet Take 2 mg by mouth at bedtime.   Yes Historical Provider, MD  Simethicone 80 MG TABS Take 80 mg by mouth every 6 (six) hours as needed (for flatulence.).   Yes Historical Provider, MD  simvastatin (ZOCOR) 10 MG tablet Take 10 mg by mouth at bedtime.    Yes Historical Provider, MD  tiotropium (SPIRIVA) 18 MCG inhalation  capsule Place 18 mcg into inhaler and inhale daily.   Yes Historical Provider, MD  vitamin C (ASCORBIC ACID) 500 MG tablet Take 500 mg by mouth 2 (two) times daily.    Yes Historical Provider, MD   Allergies  Allergen Reactions  . Amiodarone     Intolerance   . Lisinopril Other (See Comments)    Unknown reaction  . Latex Itching and Rash     FAMILY HISTORY   Family History  Problem Relation Age of Onset  . Stroke Mother   . Cancer Father   . Hepatitis Brother     b      SOCIAL HISTORY    reports that she has been smoking Cigarettes.  She has a 27 pack-year smoking history. She has never used smokeless tobacco. She reports that she does not drink alcohol or use illicit drugs. Still smoking about 5 cigs per day ROS  Review of Systems  Constitutional: Negative for fever and chills.  Eyes: Negative for blurred vision.  Respiratory: Positive for shortness of breath. Negative for cough.  Cardiovascular: Negative for chest pain.  Gastrointestinal: Positive for abdominal pain and constipation. Negative for nausea, vomiting and diarrhea.  Genitourinary: Negative for dysuria.  Musculoskeletal: Negative for joint pain.  Neurological: Negative for dizziness and headaches.   VITAL SIGNS    Temp:  [97.7 F (36.5 C)-102.5 F (39.2 C)] 99.1 F (37.3 C) (08/22 1354) Pulse Rate:  [69-91] 77 (08/22 1354) Resp:   [13-27] 16 (08/22 1330) BP: (67-131)/(41-87) 112/41 mmHg (08/22 1354) SpO2:  [92 %-100 %] 94 % (08/22 1354) Weight:  [262 lb 11.2 oz (119.16 kg)] 262 lb 11.2 oz (119.16 kg) (08/22 1400) HEMODYNAMICS:   VENTILATOR SETTINGS:   INTAKE / OUTPUT:  Intake/Output Summary (Last 24 hours) at 05/28/15 1551 Last data filed at 05/28/15 1330  Gross per 24 hour  Intake  122.1 ml  Output   2425 ml  Net -2302.9 ml       PHYSICAL EXAM   Physical Exam Constitutional: She is oriented to person, place, and time.  HENT:  Nose: No mucosal edema.  Mouth/Throat: No oropharyngeal exudate or posterior oropharyngeal edema.  Eyes: Conjunctivae, EOM and lids are normal. Pupils are equal, round, and reactive to light.  Neck: No JVD present. Carotid bruit is not present. No edema present. No thyroid mass and no thyromegaly present.  Cardiovascular: S1 normal and S2 normal. Exam reveals no gallop.  No murmur heard. Pulses:  Dorsalis pedis pulses are 2+ on the right side, and 2+ on the left side.  Respiratory: No respiratory distress. She has no wheezes. She has no rhonchi. She has no rales.  GI: Soft. Bowel sounds are normal. There is tenderness in the left lower quadrant.  Musculoskeletal:   Right ankle: She exhibits swelling.   Left ankle: She exhibits swelling.  Lymphadenopathy:   She has no cervical adenopathy.  Neurological: She is alert and oriented to person, place, and time. No cranial nerve deficit.  Skin: Skin is warm. No rash noted. Nails show no clubbing.  Right groin area area of lightened pigmented skin. No areas of blistering or skin breakdown there.  Psychiatric: She has a normal mood and affect.     LABS   LABS:  CBC  Recent Labs Lab 05/27/15 1423 05/28/15 0635  WBC 19.7* 13.0*  HGB 11.6* 11.7*  HCT 36.5 36.2  PLT 250 262   Coag's No results for input(s): APTT, INR in the last 168 hours. BMET  Recent Labs Lab 05/27/15 1423 05/28/15 0635  NA  138 140  K 4.6 3.8  CL 99* 102  CO2 29 28  BUN 36* 30*  CREATININE 1.28* 1.09*  GLUCOSE 152* 274*   Electrolytes  Recent Labs Lab 05/27/15 1423 05/28/15 0635  CALCIUM 9.0 8.9   Sepsis Markers  Recent Labs Lab 05/27/15 1423 05/27/15 1813  LATICACIDVEN 1.3 0.4*   ABG No results for input(s): PHART, PCO2ART, PO2ART in the last 168 hours. Liver Enzymes  Recent Labs Lab 05/27/15 1423  AST 26  ALT 12*  ALKPHOS 95  BILITOT 0.7  ALBUMIN 3.2*   Cardiac Enzymes  Recent Labs Lab 05/27/15 1423  TROPONINI 0.03   Glucose  Recent Labs Lab 05/27/15 1910 05/27/15 2210 05/28/15 0730 05/28/15 1136  GLUCAP 184* 246* 221* 201*     Recent Results (from the past 240 hour(s))  Urine culture     Status: None (Preliminary result)   Collection Time: 05/27/15  2:23 PM  Result Value Ref Range Status   Specimen Description URINE, RANDOM  Final   Special Requests NONE  Final   Culture   Final    >=100,000 COLONIES/mL ESCHERICHIA COLI SUSCEPTIBILITIES TO FOLLOW    Report Status PENDING  Incomplete  Blood Culture (routine x 2)     Status: None (Preliminary result)   Collection Time: 05/27/15  2:36 PM  Result Value Ref Range Status   Specimen Description BLOOD RIGHT ARM  Final   Special Requests   Final    BOTTLES DRAWN AEROBIC AND ANAEROBIC  AER 16CC ANA 6CC   Culture  Setup Time   Final    GRAM NEGATIVE RODS IN BOTH AEROBIC AND ANAEROBIC BOTTLES CRITICAL VALUE NOTED.  VALUE IS CONSISTENT WITH PREVIOUSLY REPORTED AND CALLED VALUE.    Culture GRAM NEGATIVE RODS IDENTIFICATION TO FOLLOW   Final   Report Status PENDING  Incomplete  Blood Culture (routine x 2)     Status: None (Preliminary result)   Collection Time: 05/27/15  2:57 PM  Result Value Ref Range Status   Specimen Description BLOOD RIGHT ARM  Final   Special Requests BAA,10ML,ANA,AER  Final   Culture   Final    GRAM NEGATIVE RODS IN BOTH AEROBIC AND ANAEROBIC BOTTLES CRITICAL RESULT CALLED TO, READ BACK  BY AND VERIFIED WITH: BYRUM HADDOCK @ 2342 8.21.16 MPG CONFIRMED BY [facility]    Report Status PENDING  Incomplete  MRSA PCR Screening     Status: None   Collection Time: 05/27/15  7:15 PM  Result Value Ref Range Status   MRSA by PCR NEGATIVE NEGATIVE Final    Comment:        The GeneXpert MRSA Assay (FDA approved for NASAL specimens only), is one component of a comprehensive MRSA colonization surveillance program. It is not intended to diagnose MRSA infection nor to guide or monitor treatment for MRSA infections.      Current facility-administered medications:  .  acetaminophen (TYLENOL) tablet 650 mg, 650 mg, Oral, Q6H PRN **OR** acetaminophen (TYLENOL) suppository 650 mg, 650 mg, Rectal, Q6H PRN, Enid Baas, MD .  amiodarone (PACERONE) tablet 200 mg, 200 mg, Oral, Daily, Enid Baas, MD, 200 mg at 05/28/15 1100 .  antiseptic oral rinse (CPC / CETYLPYRIDINIUM CHLORIDE 0.05%) solution 7 mL, 7 mL, Mouth Rinse, q12n4p, Enid Baas, MD, 7 mL at 05/28/15 1238 .  aspirin chewable tablet 81 mg, 81 mg, Oral, Daily, Enid Baas, MD, 81 mg at 05/28/15 1100 .  buPROPion (WELLBUTRIN XL) 24  hr tablet 150 mg, 150 mg, Oral, Daily, Enid Baas, MD, 150 mg at 05/28/15 1100 .  chlorhexidine (PERIDEX) 0.12 % solution 15 mL, 15 mL, Mouth Rinse, BID, Enid Baas, MD, 15 mL at 05/27/15 2200 .  clobetasol cream (TEMOVATE) 0.05 % 1 application, 1 application, Topical, Q12H PRN, Enid Baas, MD .  docusate sodium (COLACE) capsule 100 mg, 100 mg, Oral, BID PRN, Enid Baas, MD .  DULoxetine (CYMBALTA) DR capsule 60 mg, 60 mg, Oral, Daily, Enid Baas, MD, 60 mg at 05/28/15 1100 .  gabapentin (NEURONTIN) capsule 100 mg, 100 mg, Oral, Q1500, Enid Baas, MD, 100 mg at 05/28/15 1453 .  gabapentin (NEURONTIN) capsule 200 mg, 200 mg, Oral, BID, Enid Baas, MD, 200 mg at 05/28/15 1100 .  guaiFENesin (MUCINEX) 12 hr tablet 600 mg, 600 mg, Oral, Q8H  PRN, Enid Baas, MD .  hydrocerin (EUCERIN) cream 1 application, 1 application, Topical, QHS, Enid Baas, MD, 1 application at 05/27/15 2205 .  insulin aspart (novoLOG) injection 0-5 Units, 0-5 Units, Subcutaneous, QHS, Enid Baas, MD, 2 Units at 05/27/15 2213 .  ipratropium-albuterol (DUONEB) 0.5-2.5 (3) MG/3ML nebulizer solution 3 mL, 3 mL, Nebulization, Q6H, Coraima Tibbs, MD .  ipratropium-albuterol (DUONEB) 0.5-2.5 (3) MG/3ML nebulizer solution 3 mL, 3 mL, Nebulization, Q4H PRN, Myha Arizpe, MD .  levothyroxine (SYNTHROID, LEVOTHROID) tablet 150 mcg, 150 mcg, Oral, QAC breakfast, Enid Baas, MD, 150 mcg at 05/28/15 0809 .  magnesium oxide (MAG-OX) tablet 400 mg, 400 mg, Oral, Daily, Enid Baas, MD, 400 mg at 05/28/15 1100 .  mometasone-formoterol (DULERA) 100-5 MCG/ACT inhaler 2 puff, 2 puff, Inhalation, BID, Enid Baas, MD, 2 puff at 05/28/15 0809 .  mometasone-formoterol (DULERA) 100-5 MCG/ACT inhaler 2 puff, 2 puff, Inhalation, BID, Lexxus Underhill, MD .  montelukast (SINGULAIR) tablet 10 mg, 10 mg, Oral, Daily, Enid Baas, MD, 10 mg at 05/28/15 1100 .  oxyCODONE (Oxy IR/ROXICODONE) immediate release tablet 5 mg, 5 mg, Oral, Q4H PRN, Enid Baas, MD, 5 mg at 05/28/15 1139 .  pantoprazole (PROTONIX) EC tablet 40 mg, 40 mg, Oral, Daily, Enid Baas, MD, 40 mg at 05/28/15 1100 .  piperacillin-tazobactam (ZOSYN) IVPB 3.375 g, 3.375 g, Intravenous, 3 times per day, Enid Baas, MD, 3.375 g at 05/28/15 1453 .  polyvinyl alcohol (LIQUIFILM TEARS) 1.4 % ophthalmic solution 1 drop, 1 drop, Both Eyes, Q6H PRN, Enid Baas, MD .  Melene Muller ON 05/29/2015] predniSONE (DELTASONE) tablet 40 mg, 40 mg, Oral, Q breakfast, Alford Highland, MD .  rivaroxaban Carlena Hurl) tablet 20 mg, 20 mg, Oral, Daily, Enid Baas, MD, 20 mg at 05/28/15 1235 .  rOPINIRole (REQUIP) tablet 1 mg, 1 mg, Oral, Daily, Enid Baas, MD, 1 mg at 05/28/15  1235 .  rOPINIRole (REQUIP) tablet 2 mg, 2 mg, Oral, QHS, Enid Baas, MD, 2 mg at 05/27/15 2200 .  senna (SENOKOT) tablet 8.6 mg, 1 tablet, Oral, Daily PRN, Enid Baas, MD .  simethicone (MYLICON) chewable tablet 80 mg, 80 mg, Oral, Q6H PRN, Enid Baas, MD .  simvastatin (ZOCOR) tablet 10 mg, 10 mg, Oral, QHS, Enid Baas, MD, 10 mg at 05/27/15 2200 .  tiotropium (SPIRIVA) inhalation capsule 18 mcg, 18 mcg, Inhalation, Daily, Enid Baas, MD, 18 mcg at 05/28/15 1100 .  tiotropium (SPIRIVA) inhalation capsule 18 mcg, 18 mcg, Inhalation, Daily, Victormanuel Mclure, MD .  vitamin C (ASCORBIC ACID) tablet 500 mg, 500 mg, Oral, BID, Enid Baas, MD, 500 mg at 05/28/15 1100 .  [START ON 06/03/2015] Vitamin D (Ergocalciferol) (DRISDOL) capsule 50,000 Units,  50,000 Units, Oral, Weekly, Enid Baas, MD  IMAGING    No results found.    Indwelling Urinary Catheter continued, requirement due to   Reason to continue Indwelling Urinary Catheter for strict Intake/Output monitoring for hemodynamic instability   Central Line continued, requirement due to   Reason to continue [employer] Monitoring of central venous pressure or other hemodynamic parameters   Ventilator continued, requirement due to, resp failure    Ventilator Sedation RASS 0 to -2      ASSESSMENT/PLAN  74 year old female admitted for sepsis, presumably secondary to recurrent UTI, has a history of COPD/OSA, seen in consultation for COPD/OSA follow-up. Patient follows with Dr. Meredeth Ide, and will have follow-up with Dr. Mayo Ao upon discharge  Acute exacerbation of COPD -Secondary to sepsis and possible fluid overload initially, now with significant improvement -Patient also has obstructive sleep apnea on AutoPap at night at 2 L -No wheezing noted on exam, therefore no need for continuous IV steroids for continuous nebulizers. -Patient with stage II COPD per outpatient record -Restart  Dulera/Spiriva/when necessary DuoNeb's -Wean oral steroids by 10 mg daily -Incentive spirometry and flutter valve -Maintain O2 saturations greater and 88% - monitor I/O  Obstructive sleep apnea on CPAP -Continue with auto Pap at night with 2 L of oxygen -Patient is very compliant with her CPAP, she is currently being evaluated for a new mask  Sepsis -Improving -Prelim from urine culture showing Escherichia coli, follow S/S, continue with Zosyn for now -Monitor labs   UTI  - E. Coli, pending s/s - cont with current ABx - monitor I/O  Primary Pulmonologist - Dr. Meredeth Ide - Please schedule pulmonary follow up with Dr. Meredeth Ide 2-3 week after discharge.    I have personally obtained a history, examined the patient, evaluated laboratory and imaging results, formulated the assessment and plan and placed orders.  The Patient requires evaluation and titration of therapies, application of advanced monitoring technologies and extensive interpretation of multiple databases.  Pulmonary Consult time services described in this note is 45 minutes.     Stephanie Acre, MD [facility] Pager [phone] (Please enter [phone]-digits)     05/28/2015, 3:51 PM

## 2015-05-28 NOTE — Progress Notes (Signed)
Patient ID: [name], female   DOB: 04-21-1941, 74 y.o.   MRN: [id number] [facility] PROGRESS NOTE  PAELYN SMICK WUJ:[id number] DOB: [birth year] DOA: 05/27/2015 PCP: Stephanie Acre, MD  HPI/Subjective: Patient feeling better than yesterday. Her breathing is better. She does get frequent urinary infections. She cannot tell when she needs to urinate but she can tell when she is wet.  Objective: Filed Vitals:   05/28/15 0900  BP: 109/69  Pulse: 70  Temp: 98.4 F (36.9 C)  Resp: 20    Filed Weights   05/27/15 1415  Weight: 117.935 kg (260 lb)    ROS: Review of Systems  Constitutional: Negative for fever and chills.  Eyes: Negative for blurred vision.  Respiratory: Positive for shortness of breath. Negative for cough.   Cardiovascular: Negative for chest pain.  Gastrointestinal: Positive for abdominal pain and constipation. Negative for nausea, vomiting and diarrhea.  Genitourinary: Negative for dysuria.  Musculoskeletal: Negative for joint pain.  Neurological: Negative for dizziness and headaches.   Exam: Physical Exam  Constitutional: She is oriented to person, place, and time.  HENT:  Nose: No mucosal edema.  Mouth/Throat: No oropharyngeal exudate or posterior oropharyngeal edema.  Eyes: Conjunctivae, EOM and lids are normal. Pupils are equal, round, and reactive to light.  Neck: No JVD present. Carotid bruit is not present. No edema present. No thyroid mass and no thyromegaly present.  Cardiovascular: S1 normal and S2 normal.  Exam reveals no gallop.   No murmur heard. Pulses:      Dorsalis pedis pulses are 2+ on the right side, and 2+ on the left side.  Respiratory: No respiratory distress. She has no wheezes. She has no rhonchi. She has no rales.  GI: Soft. Bowel sounds are normal. There is tenderness in the left lower quadrant.  Musculoskeletal:       Right ankle: She exhibits swelling.       Left ankle: She exhibits swelling.  Lymphadenopathy:     She has no cervical adenopathy.  Neurological: She is alert and oriented to person, place, and time. No cranial nerve deficit.  Skin: Skin is warm. No rash noted. Nails show no clubbing.  Right groin area area of lightened pigmented skin. No areas of blistering or skin breakdown there.  Psychiatric: She has a normal mood and affect.    Data Reviewed: Basic Metabolic Panel:  Recent Labs Lab 05/27/15 1423 05/28/15 0635  NA 138 140  K 4.6 3.8  CL 99* 102  CO2 29 28  GLUCOSE 152* 274*  BUN 36* 30*  CREATININE 1.28* 1.09*  CALCIUM 9.0 8.9   Liver Function Tests:  Recent Labs Lab 05/27/15 1423  AST 26  ALT 12*  ALKPHOS 95  BILITOT 0.7  PROT 8.3*  ALBUMIN 3.2*    Recent Labs Lab 05/27/15 1423  LIPASE 12*   CBC:  Recent Labs Lab 05/27/15 1423 05/28/15 0635  WBC 19.7* 13.0*  NEUTROABS 17.1*  --   HGB 11.6* 11.7*  HCT 36.5 36.2  MCV 84.8 84.8  PLT 250 262    CBG:  Recent Labs Lab 05/27/15 1910 05/27/15 2210 05/28/15 0730  GLUCAP 184* 246* 221*    Recent Results (from the past 240 hour(s))  Urine culture     Status: None (Preliminary result)   Collection Time: 05/27/15  2:23 PM  Result Value Ref Range Status   Specimen Description URINE, RANDOM  Final   Special Requests NONE  Final   Culture   Final    >=  100,000 COLONIES/mL ESCHERICHIA COLI SUSCEPTIBILITIES TO FOLLOW    Report Status PENDING  Incomplete  Blood Culture (routine x 2)     Status: None (Preliminary result)   Collection Time: 05/27/15  2:36 PM  Result Value Ref Range Status   Specimen Description BLOOD RIGHT ARM  Final   Special Requests   Final    BOTTLES DRAWN AEROBIC AND ANAEROBIC  AER 16CC ANA 6CC   Culture  Setup Time   Final    GRAM NEGATIVE RODS IN BOTH AEROBIC AND ANAEROBIC BOTTLES CRITICAL VALUE NOTED.  VALUE IS CONSISTENT WITH PREVIOUSLY REPORTED AND CALLED VALUE.    Culture GRAM NEGATIVE RODS IDENTIFICATION TO FOLLOW   Final   Report Status PENDING  Incomplete   Blood Culture (routine x 2)     Status: None (Preliminary result)   Collection Time: 05/27/15  2:57 PM  Result Value Ref Range Status   Specimen Description BLOOD RIGHT ARM  Final   Special Requests BAA,10ML,ANA,AER  Final   Culture   Final    GRAM NEGATIVE RODS IN BOTH AEROBIC AND ANAEROBIC BOTTLES CRITICAL RESULT CALLED TO, READ BACK BY AND VERIFIED WITH: BYRUM HADDOCK @ 2342 8.21.16 MPG CONFIRMED BY [facility]    Report Status PENDING  Incomplete  MRSA PCR Screening     Status: None   Collection Time: 05/27/15  7:15 PM  Result Value Ref Range Status   MRSA by PCR NEGATIVE NEGATIVE Final    Comment:        The GeneXpert MRSA Assay (FDA approved for NASAL specimens only), is one component of a comprehensive MRSA colonization surveillance program. It is not intended to diagnose MRSA infection nor to guide or monitor treatment for MRSA infections.      Studies: Dg Chest Port 1 View  05/27/2015   CLINICAL DATA:  Patient complaining of abdominal pain, and currently is complaining of suprapubic discomfort. She's had nausea without vomiting. She didn't know she's had a fever. She has had sweats. She has baseline shortness of breath, and no new cough. Pain is mild to moderate. She has had some dysuria. Complains her mouth is very dry. hx of hypertension, GERD,CHF, and COPD, and acute respiratory failure.  EXAM: PORTABLE CHEST - 1 VIEW  COMPARISON:  01/13/2015  FINDINGS: Cardiac silhouette is normal in size. The aorta is mildly uncoiled. No mediastinal or hilar masses or evidence of adenopathy.  Mild thickening along the minor fissure. Lungs otherwise clear. No pleural effusion or pneumothorax.  Bony thorax is demineralized. There are changes from a previous anterior cervical spine fusion.  IMPRESSION: No acute cardiopulmonary disease.   Electronically Signed   By: Amie Portland M.D.   On: 05/27/2015 16:03    Scheduled Meds: . amiodarone  200 mg Oral Daily  . antiseptic oral rinse  7 mL Mouth  Rinse q12n4p  . aspirin  81 mg Oral Daily  . buPROPion  150 mg Oral Daily  . chlorhexidine  15 mL Mouth Rinse BID  . DULoxetine  60 mg Oral Daily  . gabapentin  100 mg Oral Q1500  . gabapentin  200 mg Oral BID  . hydrocerin  1 application Topical QHS  . insulin aspart  0-5 Units Subcutaneous QHS  . insulin aspart  0-9 Units Subcutaneous TID WC  . ipratropium-albuterol  3 mL Nebulization Q4H  . levothyroxine  150 mcg Oral QAC breakfast  . magnesium oxide  400 mg Oral Daily  . mometasone-formoterol  2 puff Inhalation BID  . montelukast  10 mg Oral Daily  . pantoprazole  40 mg Oral Daily  . piperacillin-tazobactam (ZOSYN)  IV  3.375 g Intravenous 3 times per day  . [START ON 05/29/2015] predniSONE  40 mg Oral Q breakfast  . rivaroxaban  20 mg Oral Daily  . rOPINIRole  1 mg Oral Daily  . rOPINIRole  2 mg Oral QHS  . simvastatin  10 mg Oral QHS  . tiotropium  18 mcg Inhalation Daily  . vitamin C  500 mg Oral BID  . [START ON 06/03/2015] Vitamin D (Ergocalciferol)  50,000 Units Oral Weekly    Assessment/Plan:  1. Septic shock. Patient now off dopamine drip and blood pressure on the lower side but stable. I will transfer out of the ICU. Blood cultures are growing gram-negative rods. Urine culture growing Escherichia coli with sensitivities to follow. I will continue Zosyn for right now. I will did escalate antibiotics as soon as possible. Discontinue vancomycin. Likely sepsis due to acute cystitis with hematuria. Since the patient does not ambulate and does not know when she is urinating, she has to be changed pretty often. I will discontinue Foley catheter tomorrow morning. Continue to hold antihypertensives medications. 2. Atrial fibrillation rate controlled- continue Xarelto and amiodarone 3. Hyperlipidemia unspecified continue simvastatin 4. Neuropathy continue gabapentin 5. History of COPD- chest x-ray negative and respiratory status stable DC stress dose steroids and do a quick  prednisone taper. 6. Anxiety and depression continue psychiatric medications  Code Status:     Code Status Orders        Start     Ordered   05/27/15 1920  Full code   Continuous     05/27/15 1919     Family Communication: Spoke with daughter on phone Disposition Plan: Back to nursing home in 2-3 days  Time spent: 30 minutes  Alford Highland  [facility]

## 2015-05-28 NOTE — Progress Notes (Signed)
Inpatient Diabetes Program Recommendations  AACE/ADA: New Consensus Statement on Inpatient Glycemic Control (2013)  Target Ranges:  Prepandial:   less than 140 mg/dL      Peak postprandial:   less than 180 mg/dL (1-2 hours)      Critically ill patients:  140 - 180 mg/dL   Results for SHARECE, FLEISCHHACKER (MRN [id number]) as of 05/28/2015 09:25  Ref. Range 05/23/2011 08:14 05/27/2015 19:10 05/27/2015 22:10 05/28/2015 07:30  Glucose-Capillary Latest Ref Range: 65-99 mg/dL 409 (H) 811 (H) 914 (H) 221 (H)   Diabetes history: Diabetes Mellitus Outpatient Diabetes medications: Metformin 250 mg daily Current orders for Inpatient glycemic control:  Novolog sensitive tid with meals and HS  Note that CBG's increased with IV steroids.    Consider increasing Novolog correction to moderate q 4 hours since patient is NPO.  Thanks,   Beryl Meager, RN, BC-ADM Inpatient Diabetes Coordinator Pager [phone] (8a-5p)

## 2015-05-28 NOTE — Progress Notes (Signed)
Initial Nutrition Assessment    INTERVENTION:   Meals and Snacks: Cater to patient preferences; encourage protein intake   NUTRITION DIAGNOSIS:   No nutrition diagnosis at this time  GOAL:   Patient will meet greater than or equal to 90% of their needs  MONITOR:    (Energy Intake, Anthropometrics, Digestive System)  REASON FOR ASSESSMENT:    (Pressure Ulcer)    ASSESSMENT:     Pt admitted with serptic shock, acute cystitis with hematuria; pt wheelchair bound at baseline  Past Medical History  Diagnosis Date  . Thyroid disease   . Depression   . Hypertension   . Lumbar disc disease   . Cervical disc disease   . Dyslipidemia   . History of ETOH abuse     dependence with withdrawl seizures  . MI, acute, non ST segment elevation   . Sleep apnea   . Neuropathy   . Anemia   . GERD (gastroesophageal reflux disease)   . Diabetes mellitus without complication   . Restless leg syndrome   . Unspecified psychosis   . CHF (congestive heart failure)   . COPD (chronic obstructive pulmonary disease)   . Atrial fibrillation   . Acute respiratory failure   . Hyperlipidemia      Diet Order:  Diet heart healthy/carb modified Room service appropriate?: Yes; Fluid consistency:: Thin   Energy Intake: pt reports good appetite, reports she ate well at lunch today. Pt was NPO at breakfast  Food and Nutrition Related History: Pt reports appetite down for the past week but still eating 3 meal per day. Does not use nutritional supplements  Skin:   (stage II on buttock and heel)  Glucose Profile:   Recent Labs  05/27/15 2210 05/28/15 0730 05/28/15 1136  GLUCAP 246* 221* 201*   Protein Profile:   Recent Labs Lab 05/27/15 1423  ALBUMIN 3.2*   Electrolyte and Renal Profile:  Recent Labs Lab 05/27/15 1423 05/28/15 0635  BUN 36* 30*  CREATININE 1.28* 1.09*  NA 138 140  K 4.6 3.8   Meds: lasix, colace, ss novolog, prenidsone  Height:   Ht Readings from Last  1 Encounters:  05/28/15  (1.651 m)    Weight: pt reports weight has been stable  Wt Readings from Last 1 Encounters:  05/28/15 262 lb 11.2 oz (119.16 kg)    BMI:  Body mass index is 43.72 kg/(m^2).  LOW Care Level  Traci Starcher MS, [place], LDN [phone] Pager

## 2015-05-29 ENCOUNTER — Inpatient Hospital Stay: Payer: Medicare Other

## 2015-05-29 DIAGNOSIS — L8990 Pressure ulcer of unspecified site, unspecified stage: Secondary | ICD-10-CM

## 2015-05-29 LAB — GLUCOSE, CAPILLARY
GLUCOSE-CAPILLARY: 209 mg/dL — AB (ref 65–99)
GLUCOSE-CAPILLARY: 177 mg/dL — AB (ref 65–99)
GLUCOSE-CAPILLARY: 158 mg/dL — AB (ref 65–99)
Glucose-Capillary: 207 mg/dL — ABNORMAL HIGH (ref 65–99)

## 2015-05-29 LAB — CULTURE, BLOOD (ROUTINE X 2)

## 2015-05-29 LAB — URINE CULTURE

## 2015-05-29 MED ORDER — INSULIN GLARGINE 100 UNIT/ML ~~LOC~~ SOLN
5.0000 [IU] | Freq: Every day | SUBCUTANEOUS | Status: DC
  Administered 2015-05-29: 5 [IU] via SUBCUTANEOUS
  Filled 2015-05-29 (×2): qty 0.05

## 2015-05-29 MED ORDER — SODIUM CHLORIDE 0.9 % IV SOLN
1.0000 g | INTRAVENOUS | Status: DC
  Administered 2015-05-29 – 2015-05-30 (×2): 1 g via INTRAVENOUS
  Filled 2015-05-29 (×4): qty 1

## 2015-05-29 MED ORDER — INSULIN ASPART 100 UNIT/ML ~~LOC~~ SOLN
0.0000 [IU] | Freq: Three times a day (TID) | SUBCUTANEOUS | Status: DC
  Administered 2015-05-30: 2 [IU] via SUBCUTANEOUS
  Filled 2015-05-29: qty 2

## 2015-05-29 MED ORDER — AMMONIUM LACTATE 12 % EX LOTN
TOPICAL_LOTION | Freq: Two times a day (BID) | CUTANEOUS | Status: DC
  Administered 2015-05-29 – 2015-05-30 (×4): via TOPICAL
  Filled 2015-05-29: qty 400

## 2015-05-29 MED ORDER — SODIUM CHLORIDE 0.9 % IJ SOLN
10.0000 mL | Freq: Two times a day (BID) | INTRAMUSCULAR | Status: DC
  Administered 2015-05-29 – 2015-05-30 (×3): 10 mL via INTRAVENOUS

## 2015-05-29 MED ORDER — PREDNISONE 20 MG PO TABS: 20.0000 mg | ORAL_TABLET | Freq: Every day | ORAL | Status: DC

## 2015-05-29 NOTE — Clinical Documentation Improvement (Signed)
Internal Medicine  Can the diagnosis of CHF be further specified?    Acuity - Acute, Chronic, Acute on Chronic   Type - Systolic, Diastolic, Systolic and Diastolic  Other  Clinically Undetermined  Please exercise your independent, professional judgment when responding. A specific answer is not anticipated or expected.  Thank you, Doy Mince, RN [phone] Clinical Documentation Specialist

## 2015-05-29 NOTE — Progress Notes (Signed)
PULMONARY / CRITICAL CARE MEDICINE   Name: [name] MRN: [id number] DOB: 05/20/1941    ADMISSION DATE:  05/27/2015 CONSULTATION DATE:  05/28/15  REFERRING MD :  Dr. Nemiah Commander  ASSESSMENT/PLAN  74 year old female admitted for sepsis, presumably secondary to recurrent UTI, has a history of COPD/OSA, seen in consultation for COPD/OSA follow-up. Patient follows with Dr. Meredeth Ide, and will have follow-up with Dr. Mayo Ao upon discharge  Acute exacerbation of COPD -Secondary to sepsis and possible fluid overload initially, now with significant improvement -Patient also has obstructive sleep apnea on AutoPap at night at 2 L -No wheezing noted on exam, therefore no need for continuous IV steroids for continuous nebulizers. -Patient with stage II COPD per outpatient record - Dulera/Spiriva/when necessary DuoNeb's -Wean oral steroids by 10 mg daily -Incentive spirometry and flutter valve -Maintain O2 saturations greater and 88% - monitor I/O  Obstructive sleep apnea on CPAP -Continue with auto Pap at night with 2 L of oxygen -Patient is very compliant with her CPAP, she is currently being evaluated for a new mask  Sepsis -Improving -Prelim from urine culture showing Escherichia coli, follow S/S, continue with Zosyn for now -Monitor labs   UTI  - E. Coli, pending s/s - cont with current ABx - monitor I/O  Primary Pulmonologist - Dr. Meredeth Ide - Please schedule pulmonary follow up with Dr. Meredeth Ide 2-3 week after discharge.   CHIEF COMPLAINT:     Abdominal pain and confusion    HISTORY OF PRESENT ILLNESS  Patient is feeling much better today. She feels that her breathing is nearly back to normal.  Prior to Admission medications   Medication Sig Start Date End Date Taking? Authorizing Provider  acetaminophen (TYLENOL) 325 MG tablet Take 650 mg by mouth 3 (three) times daily.    Yes Historical Provider, MD  amiodarone (PACERONE) 200 MG tablet Take 200 mg by mouth daily.    Yes  Historical Provider, MD  aspirin 81 MG chewable tablet Chew 81 mg by mouth daily.   Yes Historical Provider, MD  buPROPion (WELLBUTRIN XL) 150 MG 24 hr tablet Take 150 mg by mouth daily.   Yes Historical Provider, MD  Cholecalciferol (VITAMIN D3) 50000 UNITS CAPS Take 50,000 Units by mouth once a week. Take on Sundays.   Yes Historical Provider, MD  clobetasol cream (TEMOVATE) 0.05 % Apply 1 application topically every 12 (twelve) hours as needed (for hands/dryness. Apply to groin and hands.).    Yes Historical Provider, MD  docusate sodium (COLACE) 100 MG capsule Take 100 mg by mouth every 12 (twelve) hours as needed for mild constipation or moderate constipation.    Yes Historical Provider, MD  DULoxetine (CYMBALTA) 60 MG capsule Take 60 mg by mouth daily.    Yes Historical Provider, MD  Fluticasone-Salmeterol (ADVAIR) 250-50 MCG/DOSE AEPB Inhale 1 puff into the lungs 2 (two) times daily.   Yes Historical Provider, MD  furosemide (LASIX) 20 MG tablet Take 40 mg by mouth 2 (two) times daily. Take on Monday, Tuesday, Wednesday, Thursday, Friday, and Saturday.   Yes Historical Provider, MD  gabapentin (NEURONTIN) 100 MG capsule Take 100-200 mg by mouth See admin instructions. Take 2 capsules (200mg ) orally in the morning, Take 1 capsule orally in the afternoon, and Take 2 capsules (200mg ) orally at bedtime.   Yes Historical Provider, MD  guaifenesin (HUMIBID E) 400 MG TABS tablet Take 400 mg by mouth every 8 (eight) hours as needed (for cough.).   Yes Historical Provider, MD  hydrocerin (EUCERIN) CREA  Apply 1 application topically at bedtime. Apply to bilateral lower extremities.   Yes Historical Provider, MD  ipratropium-albuterol (DUONEB) 0.5-2.5 (3) MG/3ML SOLN Take 3 mLs by nebulization every 6 (six) hours as needed (for congestion.).   Yes Historical Provider, MD  levothyroxine (SYNTHROID, LEVOTHROID) 150 MCG tablet Take 150 mcg by mouth daily.    Yes Historical Provider, MD  loperamide (IMODIUM  A-D) 2 MG tablet Take 2 mg by mouth every 6 (six) hours as needed for diarrhea or loose stools.    Yes Historical Provider, MD  magnesium oxide (MAG-OX) 400 MG tablet Take 400 mg by mouth daily.   Yes Historical Provider, MD  Melatonin 5 MG TABS Take 5 mg by mouth at bedtime.   Yes Historical Provider, MD  metFORMIN (GLUCOPHAGE) 500 MG tablet Take 250 mg by mouth daily.    Yes Historical Provider, MD  metoprolol (LOPRESSOR) 50 MG tablet Take 25 mg by mouth daily.   Yes Historical Provider, MD  montelukast (SINGULAIR) 10 MG tablet Take 10 mg by mouth daily.    Yes Historical Provider, MD  Omeprazole 20 MG TBEC Take 20 mg by mouth 2 (two) times daily.   Yes Historical Provider, MD  ondansetron (ZOFRAN-ODT) 4 MG disintegrating tablet Take 4 mg by mouth every 6 (six) hours as needed for nausea or vomiting.   Yes Historical Provider, MD  OxyCODONE HCl, Abuse Deter, (OXAYDO) 5 MG TABA Take 5 mg by mouth every 4 (four) hours as needed (for moderate to severe pain.).   Yes Historical Provider, MD  POLYETHYLENE GLYCOL 3350 PO Take 17 g by mouth at bedtime.   Yes Historical Provider, MD  polyvinyl alcohol (LIQUITEARS) 1.4 % ophthalmic solution Place 1 drop into both eyes every 6 (six) hours as needed for dry eyes.   Yes Historical Provider, MD  potassium chloride SA (K-DUR,KLOR-CON) 20 MEQ tablet Take 20 mEq by mouth daily. Take on Monday, Tuesday, Wednesday, Thursday, Friday, and Saturday.   Yes Historical Provider, MD  Rivaroxaban (XARELTO) 20 MG TABS tablet Take 20 mg by mouth daily.    Yes Historical Provider, MD  rOPINIRole (REQUIP) 1 MG tablet Take 1 mg by mouth daily.    Yes Historical Provider, MD  rOPINIRole (REQUIP) 2 MG tablet Take 2 mg by mouth at bedtime.   Yes Historical Provider, MD  Simethicone 80 MG TABS Take 80 mg by mouth every 6 (six) hours as needed (for flatulence.).   Yes Historical Provider, MD  simvastatin (ZOCOR) 10 MG tablet Take 10 mg by mouth at bedtime.    Yes Historical Provider,  MD  tiotropium (SPIRIVA) 18 MCG inhalation capsule Place 18 mcg into inhaler and inhale daily.   Yes Historical Provider, MD  vitamin C (ASCORBIC ACID) 500 MG tablet Take 500 mg by mouth 2 (two) times daily.    Yes Historical Provider, MD   Allergies  Allergen Reactions  . Amiodarone     Intolerance   . Lisinopril Other (See Comments)    Unknown reaction  . Latex Itching and Rash     SOCIAL HISTORY    reports that she has been smoking Cigarettes.  She has a 27 pack-year smoking history. She has never used smokeless tobacco. She reports that she does not drink alcohol or use illicit drugs. Still smoking about 5 cigs per day ROS  Review of Systems  Constitutional: Negative for fever and chills.  Eyes: Negative for blurred vision.  Respiratory: Positive for shortness of breath. Negative for cough.  Cardiovascular: Negative for chest pain.  Gastrointestinal: Positive for abdominal pain and constipation. Negative for nausea, vomiting and diarrhea.  Genitourinary: Negative for dysuria.  Musculoskeletal: Negative for joint pain.  Neurological: Negative for dizziness and headaches.   VITAL SIGNS    Temp:  [97.9 F (36.6 C)-98.9 F (37.2 C)] 98 F (36.7 C) (08/23 1545) Pulse Rate:  [70-81] 71 (08/23 1547) Resp:  [17-20] 18 (08/23 1545) BP: (85-140)/(46-109) 140/109 mmHg (08/23 1547) SpO2:  [92 %-96 %] 94 % (08/23 1545) HEMODYNAMICS:   VENTILATOR SETTINGS:   INTAKE / OUTPUT:  Intake/Output Summary (Last 24 hours) at 05/29/15 1557 Last data filed at 05/29/15 1230  Gross per 24 hour  Intake    806 ml  Output   2350 ml  Net  -1544 ml       PHYSICAL EXAM   Physical Exam Constitutional: She is oriented to person, place, and time.  HENT:  Nose: No mucosal edema.  Mouth/Throat: No oropharyngeal exudate or posterior oropharyngeal edema.  Eyes: Conjunctivae, EOM and lids are normal. Pupils are equal, round, and reactive to light.  Neck: No JVD present. Carotid bruit  is not present. No edema present. No thyroid mass and no thyromegaly present.  Cardiovascular: S1 normal and S2 normal. Exam reveals no gallop.  No murmur heard. Pulses:  Dorsalis pedis pulses are 2+ on the right side, and 2+ on the left side.  Respiratory: No respiratory distress. She has no wheezes. She has no rhonchi. She has no rales.  GI: Soft. Bowel sounds are normal. There is tenderness in the left lower quadrant.  Musculoskeletal:   Right ankle: She exhibits swelling.   Left ankle: She exhibits swelling.  Lymphadenopathy:   She has no cervical adenopathy.  Neurological: She is alert and oriented to person, place, and time. No cranial nerve deficit.  Skin: Skin is warm. No rash noted. Nails show no clubbing.  Right groin area area of lightened pigmented skin. No areas of blistering or skin breakdown there.  Psychiatric: She has a normal mood and affect.     LABS   LABS:  CBC  Recent Labs Lab 05/27/15 1423 05/28/15 0635  WBC 19.7* 13.0*  HGB 11.6* 11.7*  HCT 36.5 36.2  PLT 250 262   Coag's No results for input(s): APTT, INR in the last 168 hours. BMET  Recent Labs Lab 05/27/15 1423 05/28/15 0635  NA 138 140  K 4.6 3.8  CL 99* 102  CO2 29 28  BUN 36* 30*  CREATININE 1.28* 1.09*  GLUCOSE 152* 274*   Electrolytes  Recent Labs Lab 05/27/15 1423 05/28/15 0635  CALCIUM 9.0 8.9   Sepsis Markers  Recent Labs Lab 05/27/15 1423 05/27/15 1813  LATICACIDVEN 1.3 0.4*   ABG No results for input(s): PHART, PCO2ART, PO2ART in the last 168 hours. Liver Enzymes  Recent Labs Lab 05/27/15 1423  AST 26  ALT 12*  ALKPHOS 95  BILITOT 0.7  ALBUMIN 3.2*   Cardiac Enzymes  Recent Labs Lab 05/27/15 1423  TROPONINI 0.03   Glucose  Recent Labs Lab 05/28/15 0730 05/28/15 1136 05/28/15 1621 05/28/15 2301 05/29/15 0725 05/29/15 1116  GLUCAP 221* 201* 250* 213* 207* 209*         IMAGING    Dg Chest 1 View  05/29/2015    CLINICAL DATA:  PICC line placement, history hypertension, MI, diabetes mellitus, smoking, COPD, CHF  EXAM: CHEST  1 VIEW  COMPARISON:  Portable exam 1414 hours compared to 05/27/2015  FINDINGS: RIGHT  arm PICC line tip projects over mid SVC.  Enlargement of cardiac silhouette.  Rotated to the RIGHT.  Mediastinal contours and pulmonary vascularity normal for technique.  Subsegmental atelectasis at minor fissure and RIGHT base.  Lungs otherwise clear.  IMPRESSION: Tip of RIGHT arm PICC line projects over mid SVC.  Subsegmental atelectasis at mid to lower RIGHT lung.   Electronically Signed   By: Ulyses Southward M.D.   On: 05/29/2015 14:38      Indwelling Urinary Catheter continued, requirement due to   Reason to continue Indwelling Urinary Catheter for strict Intake/Output monitoring for hemodynamic instability   Central Line continued, requirement due to   Reason to continue [employer] Monitoring of central venous pressure or other hemodynamic parameters   Ventilator continued, requirement due to, resp failure    Ventilator Sedation RASS 0 to -2           Deep Nicholos Johns,  M.D.      05/29/2015, 3:57 PM

## 2015-05-29 NOTE — Progress Notes (Signed)
Patient ID: [name], female   DOB: 15-Mar-1941, 74 y.o.   MRN: [id number] [facility] PROGRESS NOTE  RAYELLE ARMOR WUJ:[id number] DOB: 1941/08/14 DOA: 05/27/2015 PCP: Stephanie Acre, MD  HPI/Subjective: Patient feeling better today. Offers no complaints. She had a good bowel movement yesterday. Breathing better and now off oxygen.  Objective: Filed Vitals:   05/29/15 0728  BP: 108/56  Pulse: 76  Temp: 98.1 F (36.7 C)  Resp: 18    Filed Weights   05/27/15 1415 05/28/15 1400  Weight: 117.935 kg (260 lb) 119.16 kg (262 lb 11.2 oz)    ROS: Review of Systems  Constitutional: Negative for fever and chills.  Eyes: Negative for blurred vision.  Respiratory: Negative for cough and shortness of breath.   Cardiovascular: Negative for chest pain.  Gastrointestinal: Negative for nausea, vomiting, abdominal pain, diarrhea and constipation.  Genitourinary: Negative for dysuria.  Musculoskeletal: Negative for joint pain.  Neurological: Negative for dizziness and headaches.   Exam: Physical Exam  Constitutional: She is oriented to person, place, and time.  HENT:  Nose: No mucosal edema.  Mouth/Throat: No oropharyngeal exudate or posterior oropharyngeal edema.  Eyes: Conjunctivae, EOM and lids are normal. Pupils are equal, round, and reactive to light.  Neck: No JVD present. Carotid bruit is not present. No edema present. No thyroid mass and no thyromegaly present.  Cardiovascular: S1 normal and S2 normal.  Exam reveals no gallop.   No murmur heard. Pulses:      Dorsalis pedis pulses are 2+ on the right side, and 2+ on the left side.  Respiratory: No respiratory distress. She has no wheezes. She has no rhonchi. She has no rales.  GI: Soft. Bowel sounds are normal. There is no tenderness.  Musculoskeletal:       Right ankle: She exhibits swelling.       Left ankle: She exhibits swelling.  Lymphadenopathy:    She has no cervical adenopathy.  Neurological: She is  alert and oriented to person, place, and time. No cranial nerve deficit.  Skin: Skin is warm. No rash noted. Nails show no clubbing.  As per nurse stage II decubiti on buttock Bilateral hands scaling and thickened skin.  Psychiatric: She has a normal mood and affect.    Data Reviewed: Basic Metabolic Panel:  Recent Labs Lab 05/27/15 1423 05/28/15 0635  NA 138 140  K 4.6 3.8  CL 99* 102  CO2 29 28  GLUCOSE 152* 274*  BUN 36* 30*  CREATININE 1.28* 1.09*  CALCIUM 9.0 8.9   Liver Function Tests:  Recent Labs Lab 05/27/15 1423  AST 26  ALT 12*  ALKPHOS 95  BILITOT 0.7  PROT 8.3*  ALBUMIN 3.2*    Recent Labs Lab 05/27/15 1423  LIPASE 12*   CBC:  Recent Labs Lab 05/27/15 1423 05/28/15 0635  WBC 19.7* 13.0*  NEUTROABS 17.1*  --   HGB 11.6* 11.7*  HCT 36.5 36.2  MCV 84.8 84.8  PLT 250 262    CBG:  Recent Labs Lab 05/28/15 0730 05/28/15 1136 05/28/15 1621 05/28/15 2301 05/29/15 0725  GLUCAP 221* 201* 250* 213* 207*    Recent Results (from the past 240 hour(s))  Urine culture     Status: None   Collection Time: 05/27/15  2:23 PM  Result Value Ref Range Status   Specimen Description URINE, RANDOM  Final   Special Requests NONE  Final   Culture   Final    >=100,000 COLONIES/mL ESCHERICHIA COLI Results Called to:  Clydie Braun [facility] 05/29/15 1610 JGF Susceptibility Pattern Suggests Possibility of an Extended Spectrum Beta Lactamase Producer. Contact Laboratory Within 7 Days if Confirmation Warranted.    Report Status 05/29/2015 FINAL  Final   Organism ID, Bacteria ESCHERICHIA COLI  Final      Susceptibility   Escherichia coli - MIC*    AMPICILLIN >=32 RESISTANT Resistant     CEFTAZIDIME >=64 RESISTANT Resistant     CEFAZOLIN >=64 RESISTANT Resistant     CEFTRIAXONE >=64 RESISTANT Resistant     CIPROFLOXACIN >=4 RESISTANT Resistant     GENTAMICIN >=16 RESISTANT Resistant     IMIPENEM <=0.25 SENSITIVE Sensitive     TRIMETH/SULFA >=320 RESISTANT  Resistant     Extended ESBL POSITIVE Resistant     * >=100,000 COLONIES/mL ESCHERICHIA COLI  Blood Culture (routine x 2)     Status: None (Preliminary result)   Collection Time: 05/27/15  2:36 PM  Result Value Ref Range Status   Specimen Description BLOOD RIGHT ARM  Final   Special Requests   Final    BOTTLES DRAWN AEROBIC AND ANAEROBIC  AER 16CC ANA 6CC   Culture  Setup Time   Final    GRAM NEGATIVE RODS IN BOTH AEROBIC AND ANAEROBIC BOTTLES CRITICAL VALUE NOTED.  VALUE IS CONSISTENT WITH PREVIOUSLY REPORTED AND CALLED VALUE.    Culture GRAM NEGATIVE RODS IDENTIFICATION TO FOLLOW   Final   Report Status PENDING  Incomplete  Blood Culture (routine x 2)     Status: None   Collection Time: 05/27/15  2:57 PM  Result Value Ref Range Status   Specimen Description BLOOD RIGHT ARM  Final   Special Requests BAA,10ML,ANA,AER  Final   Culture  Setup Time GRAM NEGATIVE RODS  Final   Culture   Final    ESCHERICHIA COLI IN BOTH AEROBIC AND ANAEROBIC BOTTLES CRITICAL RESULT CALLED TO, READ BACK BY AND VERIFIED WITH: BYRUM HADDOCK @ 2342 8.21.16 MPG CONFIRMED BY [facility] Susceptibility Pattern Suggests Possibility of an Extended Spectrum Beta Lactamase Producer. Contact Laboratory Within 7 Days if Confirmation Warranted.    Report Status 05/29/2015 FINAL  Final   Organism ID, Bacteria ESCHERICHIA COLI  Final      Susceptibility   Escherichia coli - MIC*    AMPICILLIN >=32 RESISTANT Resistant     CEFTAZIDIME >=64 RESISTANT Resistant     CEFAZOLIN >=64 RESISTANT Resistant     CEFTRIAXONE >=64 RESISTANT Resistant     CIPROFLOXACIN >=4 RESISTANT Resistant     GENTAMICIN >=16 RESISTANT Resistant     IMIPENEM <=0.25 SENSITIVE Sensitive     TRIMETH/SULFA >=320 RESISTANT Resistant     Extended ESBL POSITIVE Resistant     * ESCHERICHIA COLI  MRSA PCR Screening     Status: None   Collection Time: 05/27/15  7:15 PM  Result Value Ref Range Status   MRSA by PCR NEGATIVE NEGATIVE Final    Comment:         The GeneXpert MRSA Assay (FDA approved for NASAL specimens only), is one component of a comprehensive MRSA colonization surveillance program. It is not intended to diagnose MRSA infection nor to guide or monitor treatment for MRSA infections.      Studies: Dg Chest Port 1 View  05/27/2015   CLINICAL DATA:  Patient complaining of abdominal pain, and currently is complaining of suprapubic discomfort. She's had nausea without vomiting. She didn't know she's had a fever. She has had sweats. She has baseline shortness of breath, and no new cough. Pain  is mild to moderate. She has had some dysuria. Complains her mouth is very dry. hx of hypertension, GERD,CHF, and COPD, and acute respiratory failure.  EXAM: PORTABLE CHEST - 1 VIEW  COMPARISON:  01/13/2015  FINDINGS: Cardiac silhouette is normal in size. The aorta is mildly uncoiled. No mediastinal or hilar masses or evidence of adenopathy.  Mild thickening along the minor fissure. Lungs otherwise clear. No pleural effusion or pneumothorax.  Bony thorax is demineralized. There are changes from a previous anterior cervical spine fusion.  IMPRESSION: No acute cardiopulmonary disease.   Electronically Signed   By: Amie Portland M.D.   On: 05/27/2015 16:03    Scheduled Meds: . amiodarone  200 mg Oral Daily  . ammonium lactate   Topical BID  . antiseptic oral rinse  7 mL Mouth Rinse q12n4p  . aspirin  81 mg Oral Daily  . buPROPion  150 mg Oral Daily  . chlorhexidine  15 mL Mouth Rinse BID  . DULoxetine  60 mg Oral Daily  . ertapenem  1 g Intravenous Q24H  . gabapentin  100 mg Oral Q1500  . gabapentin  200 mg Oral BID  . hydrocerin  1 application Topical QHS  . insulin aspart  0-5 Units Subcutaneous QHS  . ipratropium-albuterol  3 mL Nebulization Q6H  . levothyroxine  150 mcg Oral QAC breakfast  . magnesium oxide  400 mg Oral Daily  . mometasone-formoterol  2 puff Inhalation BID  . mometasone-formoterol  2 puff Inhalation BID  . montelukast   10 mg Oral Daily  . pantoprazole  40 mg Oral Daily  . predniSONE  40 mg Oral Q breakfast  . rivaroxaban  20 mg Oral Daily  . rOPINIRole  1 mg Oral Daily  . rOPINIRole  2 mg Oral QHS  . simvastatin  10 mg Oral QHS  . tiotropium  18 mcg Inhalation Daily  . tiotropium  18 mcg Inhalation Daily  . vitamin C  500 mg Oral BID  . [START ON 06/03/2015] Vitamin D (Ergocalciferol)  50,000 Units Oral Weekly    Assessment/Plan:  1. Septic shock with ESBL Escherichia coli. Blood pressure stable. Likely sepsis due to acute cystitis with hematuria. Since the patient does not ambulate and does not know when she is urinating, she has to be changed pretty often. I will place a PICC line. Patient will need 14 days of IV Invanz. Antibiotics changed Invanz today. Continue to hold antihypertensives medications. 2. Atrial fibrillation rate controlled- continue Xarelto and amiodarone 3. Hyperlipidemia unspecified continue simvastatin 4. Neuropathy continue gabapentin 5. History of COPD- chest x-ray negative and respiratory status stable DC stress dose steroids and do a quick prednisone taper. 6. Anxiety and depression continue psychiatric medications 7. Fungal infection hands- start with Lac-Hydrin lotion to loosen up the skin 8. Stage II decubiti buttock as per nurse. I will leave Foley catheter in today and consider getting rid of that prior to discharge.  Code Status:     Code Status Orders        Start     Ordered   05/27/15 1920  Full code   Continuous     05/27/15 1919     Family Communication: Spoke with daughter on phone Disposition Plan: Back to nursing home in 2-3 days  Time spent: 22 minutes  Alford Highland  [facility]

## 2015-05-29 NOTE — Care Management (Signed)
From [facility] , may discharge on IVABX.  Uses wheelchair at home. Physician and therapy provided by facility. No CM needs identified. CSW will follow for return to [facility].

## 2015-05-29 NOTE — Progress Notes (Signed)
[facility] called to request PICC line placement today. [facility] stated that the PICC Line will be placed today.

## 2015-05-29 NOTE — Care Management Important Message (Signed)
Important Message  Patient Details  Name: [name] MRN: [id number] Date of Birth: [birth year]   Medicare Important Message Given:  Yes-second notification given    [name] 05/29/2015, 9:03 AM

## 2015-05-30 LAB — CBC
HCT: 33.7 % — ABNORMAL LOW (ref 35.0–47.0)
Hemoglobin: 11 g/dL — ABNORMAL LOW (ref 12.0–16.0)
MCH: 27.5 pg (ref 26.0–34.0)
MCHC: 32.6 g/dL (ref 32.0–36.0)
MCV: 84.2 fL (ref 80.0–100.0)
PLATELETS: 275 10*3/uL (ref 150–440)
RBC: 4 MIL/uL (ref 3.80–5.20)
RDW: 17.3 % — ABNORMAL HIGH (ref 11.5–14.5)
WBC: 14.5 10*3/uL — ABNORMAL HIGH (ref 3.6–11.0)

## 2015-05-30 LAB — GLUCOSE, CAPILLARY
GLUCOSE-CAPILLARY: 120 mg/dL — AB (ref 65–99)
Glucose-Capillary: 121 mg/dL — ABNORMAL HIGH (ref 65–99)
Glucose-Capillary: 228 mg/dL — ABNORMAL HIGH (ref 65–99)
Glucose-Capillary: 105 mg/dL — ABNORMAL HIGH (ref 65–99)

## 2015-05-30 MED ORDER — METFORMIN HCL 500 MG PO TABS
500.0000 mg | ORAL_TABLET | Freq: Every day | ORAL | Status: DC
  Administered 2015-05-30: 500 mg via ORAL
  Filled 2015-05-30: qty 1

## 2015-05-30 MED ORDER — METOPROLOL SUCCINATE ER 25 MG PO TB24
25.0000 mg | ORAL_TABLET | Freq: Every day | ORAL | Status: DC
  Administered 2015-05-30: 25 mg via ORAL
  Filled 2015-05-30: qty 1

## 2015-05-30 MED ORDER — PREDNISONE 10 MG PO TABS: 10.0000 mg | ORAL_TABLET | Freq: Every day | ORAL | Status: DC

## 2015-05-30 NOTE — Progress Notes (Signed)
Removed the 2 PIV's in pt's right FA due to discomfort. Pt now has right upper arm PICC that flushed well.

## 2015-05-30 NOTE — Progress Notes (Signed)
Patient ID: [name], female   DOB: [birth year], 74 y.o.   MRN: [id number] [facility] PROGRESS NOTE  DUSTI TETRO WUJ:[id number] DOB: 04-17-1941 DOA: 05/27/2015 PCP: Stephanie Acre, MD  HPI/Subjective: Patient feels well. Stated she still had some shortness of breath and some cough.  Objective: Filed Vitals:   05/30/15 0727  BP: 151/74  Pulse: 73  Temp: 98.1 F (36.7 C)  Resp: 17    Filed Weights   05/27/15 1415 05/28/15 1400  Weight: 117.935 kg (260 lb) 119.16 kg (262 lb 11.2 oz)    ROS: Review of Systems  Constitutional: Negative for fever and chills.  Eyes: Negative for blurred vision.  Respiratory: Negative for cough and shortness of breath.   Cardiovascular: Negative for chest pain.  Gastrointestinal: Negative for nausea, vomiting, abdominal pain, diarrhea and constipation.  Genitourinary: Negative for dysuria.  Musculoskeletal: Negative for joint pain.  Neurological: Negative for dizziness and headaches.   Exam: Physical Exam  Constitutional: She is oriented to person, place, and time.  HENT:  Nose: No mucosal edema.  Mouth/Throat: No oropharyngeal exudate or posterior oropharyngeal edema.  Eyes: Conjunctivae, EOM and lids are normal. Pupils are equal, round, and reactive to light.  Neck: No JVD present. Carotid bruit is not present. No edema present. No thyroid mass and no thyromegaly present.  Cardiovascular: S1 normal and S2 normal.  Exam reveals no gallop.   No murmur heard. Pulses:      Dorsalis pedis pulses are 2+ on the right side, and 2+ on the left side.  Respiratory: No respiratory distress. She has no wheezes. She has no rhonchi. She has no rales.  GI: Soft. Bowel sounds are normal. There is no tenderness.  Musculoskeletal:       Right ankle: She exhibits swelling.       Left ankle: She exhibits swelling.  Lymphadenopathy:    She has no cervical adenopathy.  Neurological: She is alert and oriented to person, place, and time. No  cranial nerve deficit.  Skin: Skin is warm. No rash noted. Nails show no clubbing.  As per nurse stage II decubiti on buttock Bilateral hands scaling and thickened skin.  Psychiatric: She has a normal mood and affect.    Data Reviewed: Basic Metabolic Panel:  Recent Labs Lab 05/27/15 1423 05/28/15 0635  NA 138 140  K 4.6 3.8  CL 99* 102  CO2 29 28  GLUCOSE 152* 274*  BUN 36* 30*  CREATININE 1.28* 1.09*  CALCIUM 9.0 8.9   Liver Function Tests:  Recent Labs Lab 05/27/15 1423  AST 26  ALT 12*  ALKPHOS 95  BILITOT 0.7  PROT 8.3*  ALBUMIN 3.2*   CBC:  Recent Labs Lab 05/27/15 1423 05/28/15 0635 05/30/15 0412  WBC 19.7* 13.0* 14.5*  NEUTROABS 17.1*  --   --   HGB 11.6* 11.7* 11.0*  HCT 36.5 36.2 33.7*  MCV 84.8 84.8 84.2  PLT 250 262 275    CBG:  Recent Labs Lab 05/29/15 0725 05/29/15 1116 05/29/15 1625 05/29/15 2231 05/30/15 0725  GLUCAP 207* 209* 177* 158* 121*       Studies: Dg Chest 1 View  05/29/2015   CLINICAL DATA:  PICC line placement, history hypertension, MI, diabetes mellitus, smoking, COPD, CHF  EXAM: CHEST  1 VIEW  COMPARISON:  Portable exam 1414 hours compared to 05/27/2015  FINDINGS: RIGHT arm PICC line tip projects over mid SVC.  Enlargement of cardiac silhouette.  Rotated to the RIGHT.  Mediastinal contours and pulmonary  vascularity normal for technique.  Subsegmental atelectasis at minor fissure and RIGHT base.  Lungs otherwise clear.  IMPRESSION: Tip of RIGHT arm PICC line projects over mid SVC.  Subsegmental atelectasis at mid to lower RIGHT lung.   Electronically Signed   By: Ulyses Southward M.D.   On: 05/29/2015 14:38    Scheduled Meds: . amiodarone  200 mg Oral Daily  . ammonium lactate   Topical BID  . antiseptic oral rinse  7 mL Mouth Rinse q12n4p  . aspirin  81 mg Oral Daily  . buPROPion  150 mg Oral Daily  . chlorhexidine  15 mL Mouth Rinse BID  . DULoxetine  60 mg Oral Daily  . ertapenem  1 g Intravenous Q24H  .  gabapentin  100 mg Oral Q1500  . gabapentin  200 mg Oral BID  . hydrocerin  1 application Topical QHS  . insulin aspart  0-24 Units Subcutaneous TID AC  . insulin aspart  0-5 Units Subcutaneous QHS  . insulin glargine  5 Units Subcutaneous QHS  . levothyroxine  150 mcg Oral QAC breakfast  . magnesium oxide  400 mg Oral Daily  . mometasone-formoterol  2 puff Inhalation BID  . montelukast  10 mg Oral Daily  . pantoprazole  40 mg Oral Daily  . predniSONE  20 mg Oral Q breakfast  . rivaroxaban  20 mg Oral Daily  . rOPINIRole  1 mg Oral Daily  . rOPINIRole  2 mg Oral QHS  . simvastatin  10 mg Oral QHS  . sodium chloride  10 mL Intravenous Q12H  . tiotropium  18 mcg Inhalation Daily  . vitamin C  500 mg Oral BID  . [START ON 06/03/2015] Vitamin D (Ergocalciferol)  50,000 Units Oral Weekly    Assessment/Plan:  1. Septic shock with ESBL Escherichia coli due to acute cystitis with hematuria. Since the patient does not ambulate and does not know when she is urinating, she has to be changed pretty often. PICC line. Patient will need 14 days of IV Invanz. Antibiotics changed Invanz yesterday. Blood pressure still very variable with some low numbers and some high numbers. 2. Atrial fibrillation rate controlled- continue Xarelto and amiodarone 3. Hyperlipidemia unspecified continue simvastatin 4. Neuropathy continue gabapentin 5. History of COPD- chest x-ray negative and respiratory status stable. Taper prednisone again for tomorrow. 6. Anxiety and depression continue psychiatric medications 7. Fungal infection hands- start with Lac-Hydrin lotion to loosen up the skin 8. Stage II decubiti buttock as per nurse. I will leave Foley catheter in today and consider getting rid of that prior to discharge.  Code Status:     Code Status Orders        Start     Ordered   05/27/15 1920  Full code   Continuous     05/27/15 1919     Family Communication: Spoke with daughter on phone  yesterday Disposition Plan: Back to nursing home likely tomorrow  Time spent: 20 minutes  Alford Highland  [facility] [place] [facility]

## 2015-05-31 LAB — CBC
HEMATOCRIT: 35.4 % (ref 35.0–47.0)
HEMOGLOBIN: 11.5 g/dL — AB (ref 12.0–16.0)
MCH: 27.4 pg (ref 26.0–34.0)
MCHC: 32.6 g/dL (ref 32.0–36.0)
MCV: 84.2 fL (ref 80.0–100.0)
Platelets: 295 10*3/uL (ref 150–440)
RBC: 4.21 MIL/uL (ref 3.80–5.20)
RDW: 16.9 % — ABNORMAL HIGH (ref 11.5–14.5)
WBC: 17.4 10*3/uL — ABNORMAL HIGH (ref 3.6–11.0)

## 2015-05-31 LAB — GLUCOSE, CAPILLARY: Glucose-Capillary: 155 mg/dL — ABNORMAL HIGH (ref 65–99)

## 2015-05-31 MED ORDER — AMMONIUM LACTATE 12 % EX LOTN: TOPICAL_LOTION | Freq: Two times a day (BID) | CUTANEOUS | Status: AC

## 2015-05-31 MED ORDER — SODIUM CHLORIDE 0.9 % IV SOLN: 1.0000 g | INTRAVENOUS | Status: DC

## 2015-05-31 MED ORDER — OXYCODONE HCL 5 MG PO TABA: 5.0000 mg | ORAL_TABLET | ORAL | Status: DC | PRN

## 2015-05-31 MED ORDER — MICONAZOLE NITRATE 2 % EX CREA
TOPICAL_CREAM | Freq: Two times a day (BID) | CUTANEOUS | Status: DC
  Filled 2015-05-31: qty 14

## 2015-05-31 NOTE — Discharge Summary (Signed)
[facility] [place] at [facility]   PATIENT NAME: [name]    MR#:  [id number]  DATE OF BIRTH:  September 21, 1941  DATE OF ADMISSION:  05/27/2015 ADMITTING PHYSICIAN: Enid Baas, MD  DATE OF DISCHARGE: 05/31/2015  PRIMARY CARE PHYSICIAN: Stephanie Acre, MD    ADMISSION DIAGNOSIS:  Complicated UTI (urinary tract infection) [N39.0] Sepsis due to other etiology [A41.89] Acute renal failure, unspecified acute renal failure type [N17.9]  DISCHARGE DIAGNOSIS:  Active Problems:   Sepsis   Pressure ulcer   SECONDARY DIAGNOSIS:   Past Medical History  Diagnosis Date  . Thyroid disease   . Depression   . Hypertension   . Lumbar disc disease   . Cervical disc disease   . Dyslipidemia   . History of ETOH abuse     dependence with withdrawl seizures  . MI, acute, non ST segment elevation   . Sleep apnea   . Neuropathy   . Anemia   . GERD (gastroesophageal reflux disease)   . Diabetes mellitus without complication   . Restless leg syndrome   . Unspecified psychosis   . CHF (congestive heart failure)   . COPD (chronic obstructive pulmonary disease)   . Atrial fibrillation   . Acute respiratory failure   . Hyperlipidemia     HOSPITAL COURSE:   1. Septic shock with ESBL Escherichia coli secondary to acute cystitis with hematuria. Patient is high risk for bladder infections since she is immobile and cannot tell if she has to urinate. Recommend changing her when she tells you she is wet and also trying to get her on the bedpan every 2 hours while awake. She came in with hypotension and signs of clinical sepsis she was initially started on Zosyn and vancomycin. When the blood cultures came back gram-negative rods the vancomycin was discontinued. When I came back ESBL Escherichia coli Zosyn was discontinued and patient was placed on Invanz. A PICC line was placed. The patient clinically improved and got off pressors even before the Invanz was started.  Continue full course of Invanz with PICC line care. PICC line to be removed after horse of antibiotics. Consider prophylactic antibiotics to prevent urinary tract infection after course of antibiotics. 2. Atrial fibrillation rate controlled on Xarelto on amiodarone and metoprolol. 3. Hyperlipidemia unspecified continue simvastatin 4. Diabetic neuropathy on gabapentin. 5. History history of COPD, initially thought to be COPD exacerbation- steroids tapered off prior to discharge out of the hospital. 6. Anxiety depression no changes in psychiatric medication. 7. Fungal infection hands Lac-Hydrin lotion and then anti-fungal added 8. Stage II decubiti on buttock as per nurse. Foley catheter removed need to keep patient clean.  DISCHARGE CONDITIONS:   Satisfactory  CONSULTS OBTAINED:  Treatment Team:  Mertie Moores, MD  DRUG ALLERGIES:   Allergies  Allergen Reactions  . Amiodarone     Intolerance   . Lisinopril Other (See Comments)    Unknown reaction  . Latex Itching and Rash    DISCHARGE MEDICATIONS:   Current Discharge Medication List    START taking these medications   Details  ammonium lactate (LAC-HYDRIN) 12 % lotion Apply topically 2 (two) times daily. Qty: 400 g, Refills: 0    ertapenem 1 g in sodium chloride 0.9 % 50 mL Inject 1 g into the vein daily. Qty: 11 Dose, Refills: 0      CONTINUE these medications which have CHANGED   Details  OxyCODONE HCl, Abuse Deter, (OXAYDO) 5 MG TABA Take 5 mg by  mouth every 4 (four) hours as needed (for moderate to severe pain.). Qty: 30 tablet, Refills: 0      CONTINUE these medications which have NOT CHANGED   Details  acetaminophen (TYLENOL) 325 MG tablet Take 650 mg by mouth 3 (three) times daily.     amiodarone (PACERONE) 200 MG tablet Take 200 mg by mouth daily.     aspirin 81 MG chewable tablet Chew 81 mg by mouth daily.    buPROPion (WELLBUTRIN XL) 150 MG 24 hr tablet Take 150 mg by mouth daily.     Cholecalciferol (VITAMIN D3) 50000 UNITS CAPS Take 50,000 Units by mouth once a week. Take on Sundays.    clobetasol cream (TEMOVATE) 0.05 % Apply 1 application topically every 12 (twelve) hours as needed (for hands/dryness. Apply to groin and hands.).     docusate sodium (COLACE) 100 MG capsule Take 100 mg by mouth every 12 (twelve) hours as needed for mild constipation or moderate constipation.     DULoxetine (CYMBALTA) 60 MG capsule Take 60 mg by mouth daily.     Fluticasone-Salmeterol (ADVAIR) 250-50 MCG/DOSE AEPB Inhale 1 puff into the lungs 2 (two) times daily.    furosemide (LASIX) 20 MG tablet Take 40 mg by mouth 2 (two) times daily. Take on Monday, Tuesday, Wednesday, Thursday, Friday, and Saturday.    gabapentin (NEURONTIN) 100 MG capsule Take 100-200 mg by mouth See admin instructions. Take 2 capsules (200mg ) orally in the morning, Take 1 capsule orally in the afternoon, and Take 2 capsules (200mg ) orally at bedtime.    guaifenesin (HUMIBID E) 400 MG TABS tablet Take 400 mg by mouth every 8 (eight) hours as needed (for cough.).    hydrocerin (EUCERIN) CREA Apply 1 application topically at bedtime. Apply to bilateral lower extremities.    ipratropium-albuterol (DUONEB) 0.5-2.5 (3) MG/3ML SOLN Take 3 mLs by nebulization every 6 (six) hours as needed (for congestion.).    levothyroxine (SYNTHROID, LEVOTHROID) 150 MCG tablet Take 150 mcg by mouth daily.     loperamide (IMODIUM A-D) 2 MG tablet Take 2 mg by mouth every 6 (six) hours as needed for diarrhea or loose stools.     magnesium oxide (MAG-OX) 400 MG tablet Take 400 mg by mouth daily.    Melatonin 5 MG TABS Take 5 mg by mouth at bedtime.    metFORMIN (GLUCOPHAGE) 500 MG tablet Take 250 mg by mouth daily.     metoprolol (LOPRESSOR) 50 MG tablet Take 25 mg by mouth daily.    montelukast (SINGULAIR) 10 MG tablet Take 10 mg by mouth daily.     Omeprazole 20 MG TBEC Take 20 mg by mouth 2 (two) times daily.    ondansetron  (ZOFRAN-ODT) 4 MG disintegrating tablet Take 4 mg by mouth every 6 (six) hours as needed for nausea or vomiting.    POLYETHYLENE GLYCOL 3350 PO Take 17 g by mouth at bedtime.    polyvinyl alcohol (LIQUITEARS) 1.4 % ophthalmic solution Place 1 drop into both eyes every 6 (six) hours as needed for dry eyes.    potassium chloride SA (K-DUR,KLOR-CON) 20 MEQ tablet Take 20 mEq by mouth daily. Take on Monday, Tuesday, Wednesday, Thursday, Friday, and Saturday.    Rivaroxaban (XARELTO) 20 MG TABS tablet Take 20 mg by mouth daily.     !! rOPINIRole (REQUIP) 1 MG tablet Take 1 mg by mouth daily.     !! rOPINIRole (REQUIP) 2 MG tablet Take 2 mg by mouth at bedtime.    Simethicone  80 MG TABS Take 80 mg by mouth every 6 (six) hours as needed (for flatulence.).    simvastatin (ZOCOR) 10 MG tablet Take 10 mg by mouth at bedtime.     tiotropium (SPIRIVA) 18 MCG inhalation capsule Place 18 mcg into inhaler and inhale daily.    vitamin C (ASCORBIC ACID) 500 MG tablet Take 500 mg by mouth 2 (two) times daily.      !! - Potential duplicate medications found. Please discuss with provider.       DISCHARGE INSTRUCTIONS:   Follow-up with Dr. 1 day at facility  If you experience worsening of your admission symptoms, develop shortness of breath, life threatening emergency, suicidal or homicidal thoughts you must seek medical attention immediately by calling 911 or calling your MD immediately  if symptoms less severe.  You Must read complete instructions/literature along with all the possible adverse reactions/side effects for all the Medicines you take and that have been prescribed to you. Take any new Medicines after you have completely understood and accept all the possible adverse reactions/side effects.   Please note  You were cared for by a hospitalist during your hospital stay. If you have any questions about your discharge medications or the care you received while you were in the hospital after  you are discharged, you can call the unit and asked to speak with the hospitalist on call if the hospitalist that took care of you is not available. Once you are discharged, your primary care physician will handle any further medical issues. Please note that NO REFILLS for any discharge medications will be authorized once you are discharged, as it is imperative that you return to your primary care physician (or establish a relationship with a primary care physician if you do not have one) for your aftercare needs so that they can reassess your need for medications and monitor your lab values.    Today   CHIEF COMPLAINT:   Chief Complaint  Patient presents with  . Abdominal Pain    HISTORY OF PRESENT ILLNESS:  [name]  is a 74 y.o. female with multiple medical problems presented with abdominal pain and found to be in clinical sepsis with hypotension initially admitted to the ICU and placed on pressors   VITAL SIGNS:  Blood pressure 117/70, pulse 84, temperature 98.6 F (37 C), temperature source Oral, resp. rate 24, height 5\' 5"  (1.651 m), weight 119.16 kg (262 lb 11.2 oz), SpO2 91 %.    PHYSICAL EXAMINATION:  GENERAL:  74 y.o.-year-old patient lying in the bed with no acute distress.  EYES: Pupils equal, round, reactive to light and accommodation. No scleral icterus. Extraocular muscles intact.  HEENT: Head atraumatic, normocephalic. Oropharynx and nasopharynx clear.  NECK:  Supple, no jugular venous distention. No thyroid enlargement, no tenderness.  LUNGS: Decreased breath sounds bilaterally, no wheezing, rales,rhonchi or crepitation. No use of accessory muscles of respiration.  CARDIOVASCULAR: S1, S2 normal. 2/6 systolic murmurs, no rubs, or gallops.  ABDOMEN: Soft, non-tender, non-distended. Bowel sounds present. No organomegaly or mass.  EXTREMITIES: 2+ edema, no cyanosis, or clubbing.  NEUROLOGIC: Cranial nerves II through XII are intact. Muscle strength 5/5 in all  extremities. Sensation intact. Gait not checked.  PSYCHIATRIC: The patient is alert and oriented x 3.  SKIN: As per nursing staff stage II decubiti on buttock  DATA REVIEW:   CBC  Recent Labs Lab 05/31/15 0444  WBC 17.4*  HGB 11.5*  HCT 35.4  PLT 295    Chemistries  Recent Labs Lab 05/27/15 1423 05/28/15 0635  NA 138 140  K 4.6 3.8  CL 99* 102  CO2 29 28  GLUCOSE 152* 274*  BUN 36* 30*  CREATININE 1.28* 1.09*  CALCIUM 9.0 8.9  AST 26  --   ALT 12*  --   ALKPHOS 95  --   BILITOT 0.7  --     Cardiac Enzymes  Recent Labs Lab 05/27/15 1423  TROPONINI 0.03    Microbiology Results  Results for orders placed or performed during the hospital encounter of 05/27/15  Urine culture     Status: None   Collection Time: 05/27/15  2:23 PM  Result Value Ref Range Status   Specimen Description URINE, RANDOM  Final   Special Requests NONE  Final   Culture   Final    >=100,000 COLONIES/mL ESCHERICHIA COLI Results Called to: Kingsley Callander 05/29/15 0903 JGF ESBL-EXTENDED SPECTRUM BETA LACTAMASE-THE ORGANISM IS RESISTANT TO PENICILLINS, CEPHALOSPORINS AND AZTREONAM ACCORDING TO CLSI M100-S15 VOL.25 N01 JAN 2005.    Report Status 05/29/2015 FINAL  Final   Organism ID, Bacteria ESCHERICHIA COLI  Final      Susceptibility   Escherichia coli - MIC*    AMPICILLIN >=32 RESISTANT Resistant     CEFTAZIDIME >=64 RESISTANT Resistant     CEFAZOLIN >=64 RESISTANT Resistant     CEFTRIAXONE >=64 RESISTANT Resistant     CIPROFLOXACIN >=4 RESISTANT Resistant     GENTAMICIN >=16 RESISTANT Resistant     IMIPENEM <=0.25 SENSITIVE Sensitive     TRIMETH/SULFA >=320 RESISTANT Resistant     Extended ESBL POSITIVE Resistant     PIP/TAZO Value in next row Intermediate      INTERMEDIATE64    * >=100,000 COLONIES/mL ESCHERICHIA COLI  Blood Culture (routine x 2)     Status: None   Collection Time: 05/27/15  2:36 PM  Result Value Ref Range Status   Specimen Description BLOOD RIGHT ARM  Final    Special Requests   Final    BOTTLES DRAWN AEROBIC AND ANAEROBIC  AER 16CC ANA 6CC   Culture  Setup Time   Final    GRAM NEGATIVE RODS IN BOTH AEROBIC AND ANAEROBIC BOTTLES CRITICAL VALUE NOTED.  VALUE IS CONSISTENT WITH PREVIOUSLY REPORTED AND CALLED VALUE.    Culture   Final    ESCHERICHIA COLI ESBL-EXTENDED SPECTRUM BETA LACTAMASE-THE ORGANISM IS RESISTANT TO PENICILLINS, CEPHALOSPORINS AND AZTREONAM ACCORDING TO CLSI M100-S15 VOL.25 N01 JAN 2005. Results Called to: Kingsley Callander 05/29/15 0903 JGF SUSCEPTIBILITIES PERFORMED ON PREVIOUS CULTURE WITHIN THE LAST 5 DAYS.    Report Status 05/29/2015 FINAL  Final  Blood Culture (routine x 2)     Status: None   Collection Time: 05/27/15  2:57 PM  Result Value Ref Range Status   Specimen Description BLOOD RIGHT ARM  Final   Special Requests BAA,10ML,ANA,AER  Final   Culture  Setup Time GRAM NEGATIVE RODS  Final   Culture   Final    ESCHERICHIA COLI IN BOTH AEROBIC AND ANAEROBIC BOTTLES CRITICAL RESULT CALLED TO, READ BACK BY AND VERIFIED WITH: BYRUM HADDOCK @ 2342 8.21.16 MPG CONFIRMED BY [facility] Susceptibility Pattern Suggests Possibility of an Extended Spectrum Beta Lactamase Producer. Contact Laboratory Within 7 Days if Confirmation Warranted.    Report Status 05/29/2015 FINAL  Final   Organism ID, Bacteria ESCHERICHIA COLI  Final      Susceptibility   Escherichia coli - MIC*    AMPICILLIN >=32 RESISTANT Resistant     CEFTAZIDIME >=64 RESISTANT  Resistant     CEFAZOLIN >=64 RESISTANT Resistant     CEFTRIAXONE >=64 RESISTANT Resistant     CIPROFLOXACIN >=4 RESISTANT Resistant     GENTAMICIN >=16 RESISTANT Resistant     IMIPENEM <=0.25 SENSITIVE Sensitive     TRIMETH/SULFA >=320 RESISTANT Resistant     Extended ESBL POSITIVE Resistant     * ESCHERICHIA COLI  MRSA PCR Screening     Status: None   Collection Time: 05/27/15  7:15 PM  Result Value Ref Range Status   MRSA by PCR NEGATIVE NEGATIVE Final    Comment:        The GeneXpert  MRSA Assay (FDA approved for NASAL specimens only), is one component of a comprehensive MRSA colonization surveillance program. It is not intended to diagnose MRSA infection nor to guide or monitor treatment for MRSA infections.     RADIOLOGY:  Dg Chest 1 View  05/29/2015   CLINICAL DATA:  PICC line placement, history hypertension, MI, diabetes mellitus, smoking, COPD, CHF  EXAM: CHEST  1 VIEW  COMPARISON:  Portable exam 1414 hours compared to 05/27/2015  FINDINGS: RIGHT arm PICC line tip projects over mid SVC.  Enlargement of cardiac silhouette.  Rotated to the RIGHT.  Mediastinal contours and pulmonary vascularity normal for technique.  Subsegmental atelectasis at minor fissure and RIGHT base.  Lungs otherwise clear.  IMPRESSION: Tip of RIGHT arm PICC line projects over mid SVC.  Subsegmental atelectasis at mid to lower RIGHT lung.   Electronically Signed   By: Ulyses Southward M.D.   On: 05/29/2015 14:38    Management plans discussed with the patient, family and they are in agreement.  CODE STATUS:     Code Status Orders        Start     Ordered   05/27/15 1920  Full code   Continuous     05/27/15 1919      TOTAL TIME TAKING CARE OF THIS PATIENT: 35 minutes.    Alford Highland M.D on 05/31/2015 at 9:02 AM  Between 7am to 6pm - Pager - [phone]  After 6pm go to [email or web address] - password [email or web address] [facility]  [facility]  Office  [phone]  CC: Primary care physician; Stephanie Acre, MD

## 2015-05-31 NOTE — Discharge Instructions (Signed)
Picc line care Remove Picc lin after abx course Flush picc line with Saline bid and heparin flushes daily  Please put on bed pan every 2 hours while awake Please clea patient once she tells you she is wet Urinary Tract Infection A urinary tract infection (UTI) can occur any place along the urinary tract. The tract includes the kidneys, ureters, bladder, and urethra. A type of germ called bacteria often causes a UTI. UTIs are often helped with antibiotic medicine.  HOME CARE   If given, take antibiotics as told by your doctor. Finish them even if you start to feel better.  Drink enough fluids to keep your pee (urine) clear or pale yellow.  Avoid tea, drinks with caffeine, and bubbly (carbonated) drinks.  Pee often. Avoid holding your pee in for a long time.  Pee before and after having sex (intercourse).  Wipe from front to back after you poop (bowel movement) if you are a woman. Use each tissue only once. GET HELP RIGHT AWAY IF:   You have back pain.  You have lower belly (abdominal) pain.  You have chills.  You feel sick to your stomach (nauseous).  You throw up (vomit).  Your burning or discomfort with peeing does not go away.  You have a fever.  Your symptoms are not better in 3 days. MAKE SURE YOU:   Understand these instructions.  Will watch your condition.  Will get help right away if you are not doing well or get worse. Document Released: 03/10/2008 Document Revised: 06/16/2012 Document Reviewed: 04/22/2012 [facility] Patient Information 2015 [place], [place]. This information is not intended to replace advice given to you by your health care provider. Make sure you discuss any questions you have with your health care provider.

## 2015-05-31 NOTE — Progress Notes (Signed)
Pt discharged to facility, report called to [facility]. Pt VSS. Concerns addressed.  EMS called at 12:16pm. Pt discharged to facility.

## 2015-05-31 NOTE — Progress Notes (Signed)
Right upper arm PICC line dressing changed due to old blood noted on dressing. PICC line flushed well. No s/s of infection noted at this time.

## 2015-05-31 NOTE — Clinical Social Work Note (Signed)
Patient to discharge today to [facility] for IV ABX. Doug at [facility] is aware and discharge summary sent. Patient and patient's daughter were informed and daughter wishes transport via EMS. Nurse to call report to [facility]. York Spaniel MSW,LCSW [phone]

## 2015-05-31 NOTE — Progress Notes (Signed)
Spoke with Eileen Stanford, [employer] rep at [phone], to notify of non-emergent EMS transport.  Auth notification reference given as [id number].   Service date range good from 05/31/15 - 08/29/15.   Gap exception requested to determine if services can be considered at an in-network level.

## 2015-06-08 ENCOUNTER — Emergency Department: Payer: Medicare Other

## 2015-06-08 ENCOUNTER — Observation Stay
Admission: EM | Admit: 2015-06-08 | Discharge: 2015-06-10 | Disposition: A | Discharge status: Skilled Nursing Facility | Payer: Medicare Other | Attending: Internal Medicine | Admitting: Internal Medicine

## 2015-06-08 DIAGNOSIS — R400 Somnolence: Secondary | ICD-10-CM | POA: Diagnosis present

## 2015-06-08 DIAGNOSIS — K219 Gastro-esophageal reflux disease without esophagitis: Secondary | ICD-10-CM | POA: Diagnosis not present

## 2015-06-08 DIAGNOSIS — B9620 Unspecified Escherichia coli [E. coli] as the cause of diseases classified elsewhere: Secondary | ICD-10-CM | POA: Insufficient documentation

## 2015-06-08 DIAGNOSIS — Z6841 Body Mass Index (BMI) 40.0 and over, adult: Secondary | ICD-10-CM | POA: Diagnosis not present

## 2015-06-08 DIAGNOSIS — R0902 Hypoxemia: Secondary | ICD-10-CM

## 2015-06-08 DIAGNOSIS — F329 Major depressive disorder, single episode, unspecified: Secondary | ICD-10-CM | POA: Insufficient documentation

## 2015-06-08 DIAGNOSIS — E785 Hyperlipidemia, unspecified: Secondary | ICD-10-CM | POA: Insufficient documentation

## 2015-06-08 DIAGNOSIS — G4730 Sleep apnea, unspecified: Secondary | ICD-10-CM | POA: Diagnosis not present

## 2015-06-08 DIAGNOSIS — Z888 Allergy status to other drugs, medicaments and biological substances status: Secondary | ICD-10-CM | POA: Insufficient documentation

## 2015-06-08 DIAGNOSIS — E669 Obesity, unspecified: Secondary | ICD-10-CM | POA: Diagnosis not present

## 2015-06-08 DIAGNOSIS — Z79899 Other long term (current) drug therapy: Secondary | ICD-10-CM | POA: Insufficient documentation

## 2015-06-08 DIAGNOSIS — N179 Acute kidney failure, unspecified: Secondary | ICD-10-CM | POA: Diagnosis not present

## 2015-06-08 DIAGNOSIS — E039 Hypothyroidism, unspecified: Secondary | ICD-10-CM | POA: Insufficient documentation

## 2015-06-08 DIAGNOSIS — I7389 Other specified peripheral vascular diseases: Secondary | ICD-10-CM | POA: Insufficient documentation

## 2015-06-08 DIAGNOSIS — F17210 Nicotine dependence, cigarettes, uncomplicated: Secondary | ICD-10-CM | POA: Diagnosis not present

## 2015-06-08 DIAGNOSIS — I252 Old myocardial infarction: Secondary | ICD-10-CM | POA: Insufficient documentation

## 2015-06-08 DIAGNOSIS — I4891 Unspecified atrial fibrillation: Secondary | ICD-10-CM | POA: Diagnosis not present

## 2015-06-08 DIAGNOSIS — E1140 Type 2 diabetes mellitus with diabetic neuropathy, unspecified: Secondary | ICD-10-CM | POA: Diagnosis not present

## 2015-06-08 DIAGNOSIS — G9340 Encephalopathy, unspecified: Secondary | ICD-10-CM | POA: Diagnosis not present

## 2015-06-08 DIAGNOSIS — M519 Unspecified thoracic, thoracolumbar and lumbosacral intervertebral disc disorder: Secondary | ICD-10-CM | POA: Diagnosis not present

## 2015-06-08 DIAGNOSIS — I517 Cardiomegaly: Secondary | ICD-10-CM | POA: Diagnosis not present

## 2015-06-08 DIAGNOSIS — J449 Chronic obstructive pulmonary disease, unspecified: Secondary | ICD-10-CM | POA: Insufficient documentation

## 2015-06-08 DIAGNOSIS — Z7901 Long term (current) use of anticoagulants: Secondary | ICD-10-CM | POA: Diagnosis not present

## 2015-06-08 DIAGNOSIS — I10 Essential (primary) hypertension: Secondary | ICD-10-CM | POA: Diagnosis not present

## 2015-06-08 DIAGNOSIS — R4182 Altered mental status, unspecified: Secondary | ICD-10-CM | POA: Diagnosis not present

## 2015-06-08 DIAGNOSIS — I959 Hypotension, unspecified: Secondary | ICD-10-CM | POA: Diagnosis not present

## 2015-06-08 DIAGNOSIS — Z7982 Long term (current) use of aspirin: Secondary | ICD-10-CM | POA: Insufficient documentation

## 2015-06-08 DIAGNOSIS — Z7951 Long term (current) use of inhaled steroids: Secondary | ICD-10-CM | POA: Diagnosis not present

## 2015-06-08 DIAGNOSIS — F1123 Opioid dependence with withdrawal: Secondary | ICD-10-CM | POA: Insufficient documentation

## 2015-06-08 DIAGNOSIS — I509 Heart failure, unspecified: Secondary | ICD-10-CM | POA: Diagnosis not present

## 2015-06-08 DIAGNOSIS — M549 Dorsalgia, unspecified: Secondary | ICD-10-CM | POA: Insufficient documentation

## 2015-06-08 DIAGNOSIS — Z91040 Latex allergy status: Secondary | ICD-10-CM | POA: Insufficient documentation

## 2015-06-08 DIAGNOSIS — M5090 Cervical disc disorder, unspecified, unspecified cervical region: Secondary | ICD-10-CM | POA: Diagnosis not present

## 2015-06-08 DIAGNOSIS — N390 Urinary tract infection, site not specified: Secondary | ICD-10-CM | POA: Diagnosis not present

## 2015-06-08 DIAGNOSIS — R531 Weakness: Secondary | ICD-10-CM | POA: Diagnosis present

## 2015-06-08 DIAGNOSIS — G2581 Restless legs syndrome: Secondary | ICD-10-CM | POA: Insufficient documentation

## 2015-06-08 DIAGNOSIS — J9600 Acute respiratory failure, unspecified whether with hypoxia or hypercapnia: Secondary | ICD-10-CM | POA: Insufficient documentation

## 2015-06-08 LAB — URINALYSIS COMPLETE WITH MICROSCOPIC (ARMC ONLY)
BILIRUBIN URINE: NEGATIVE
Glucose, UA: NEGATIVE mg/dL
Hgb urine dipstick: NEGATIVE
KETONES UR: NEGATIVE mg/dL
NITRITE: NEGATIVE
Protein, ur: 100 mg/dL — AB
Specific Gravity, Urine: 1.018 (ref 1.005–1.030)
pH: 5 (ref 5.0–8.0)

## 2015-06-08 LAB — TROPONIN I: Troponin I: <0.03 ng/mL (ref <0.031)

## 2015-06-08 LAB — CBC WITH DIFFERENTIAL/PLATELET
BASOS ABS: 0.1 10*3/uL (ref 0–0.1)
BASOS PCT: 1 %
Eosinophils Absolute: 0.2 10*3/uL (ref 0–0.7)
Eosinophils Relative: 2 %
HEMATOCRIT: 34.6 % — AB (ref 35.0–47.0)
Hemoglobin: 11 g/dL — ABNORMAL LOW (ref 12.0–16.0)
Lymphocytes Relative: 7 %
Lymphs Abs: 1 10*3/uL (ref 1.0–3.6)
MCH: 27.3 pg (ref 26.0–34.0)
MCHC: 31.7 g/dL — ABNORMAL LOW (ref 32.0–36.0)
MCV: 86 fL (ref 80.0–100.0)
MONO ABS: 0.8 10*3/uL (ref 0.2–0.9)
Monocytes Relative: 5 %
NEUTROS ABS: 12.3 10*3/uL — AB (ref 1.4–6.5)
NEUTROS PCT: 85 %
Platelets: 522 10*3/uL — ABNORMAL HIGH (ref 150–440)
RBC: 4.03 MIL/uL (ref 3.80–5.20)
RDW: 16.5 % — AB (ref 11.5–14.5)
WBC: 14.4 10*3/uL — AB (ref 3.6–11.0)

## 2015-06-08 LAB — COMPREHENSIVE METABOLIC PANEL
ALK PHOS: 85 U/L (ref 38–126)
ALT: 13 U/L — ABNORMAL LOW (ref 14–54)
ANION GAP: 10 (ref 5–15)
AST: 12 U/L — ABNORMAL LOW (ref 15–41)
Albumin: 2.6 g/dL — ABNORMAL LOW (ref 3.5–5.0)
BILIRUBIN TOTAL: 0.4 mg/dL (ref 0.3–1.2)
BUN: 34 mg/dL — ABNORMAL HIGH (ref 6–20)
CALCIUM: 9 mg/dL (ref 8.9–10.3)
CO2: 34 mmol/L — ABNORMAL HIGH (ref 22–32)
Chloride: 95 mmol/L — ABNORMAL LOW (ref 101–111)
Creatinine, Ser: 1.45 mg/dL — ABNORMAL HIGH (ref 0.44–1.00)
GFR, EST AFRICAN AMERICAN: 41 mL/min — AB (ref >60)
GFR, EST NON AFRICAN AMERICAN: 35 mL/min — AB (ref >60)
GLUCOSE: 134 mg/dL — AB (ref 65–99)
Potassium: 5 mmol/L (ref 3.5–5.1)
Sodium: 139 mmol/L (ref 135–145)
TOTAL PROTEIN: 8 g/dL (ref 6.5–8.1)

## 2015-06-08 LAB — GLUCOSE, CAPILLARY
GLUCOSE-CAPILLARY: 141 mg/dL — AB (ref 65–99)
Glucose-Capillary: 241 mg/dL — ABNORMAL HIGH (ref 65–99)

## 2015-06-08 MED ORDER — SIMETHICONE 80 MG PO CHEW: 80.0000 mg | CHEWABLE_TABLET | Freq: Four times a day (QID) | ORAL | Status: DC | PRN

## 2015-06-08 MED ORDER — IPRATROPIUM-ALBUTEROL 0.5-2.5 (3) MG/3ML IN SOLN
3.0000 mL | Freq: Once | RESPIRATORY_TRACT | Status: AC
  Administered 2015-06-08: 3 mL via RESPIRATORY_TRACT
  Filled 2015-06-08: qty 3

## 2015-06-08 MED ORDER — SODIUM CHLORIDE 0.9 % IV SOLN
INTRAVENOUS | Status: DC
  Administered 2015-06-08: 11:00:00 via INTRAVENOUS

## 2015-06-08 MED ORDER — NICOTINE 10 MG IN INHA: 1.0000 | RESPIRATORY_TRACT | Status: DC | PRN

## 2015-06-08 MED ORDER — MELATONIN 5 MG PO TABS: 5.0000 mg | ORAL_TABLET | Freq: Every day | ORAL | Status: DC

## 2015-06-08 MED ORDER — MAGNESIUM OXIDE 400 (241.3 MG) MG PO TABS
400.0000 mg | ORAL_TABLET | Freq: Every day | ORAL | Status: DC
  Administered 2015-06-08 – 2015-06-10 (×3): 400 mg via ORAL
  Filled 2015-06-08 (×3): qty 1

## 2015-06-08 MED ORDER — SODIUM CHLORIDE 0.9 % IV BOLUS (SEPSIS)
500.0000 mL | Freq: Once | INTRAVENOUS | Status: AC
  Administered 2015-06-08: 500 mL via INTRAVENOUS

## 2015-06-08 MED ORDER — NALOXONE HCL 1 MG/ML IJ SOLN
0.4000 mg | Freq: Once | INTRAMUSCULAR | Status: AC
  Administered 2015-06-08: 0.4 mg via INTRAVENOUS

## 2015-06-08 MED ORDER — METHYLPREDNISOLONE SODIUM SUCC 125 MG IJ SOLR
125.0000 mg | INTRAMUSCULAR | Status: AC
  Administered 2015-06-08: 125 mg via INTRAVENOUS
  Filled 2015-06-08: qty 2

## 2015-06-08 MED ORDER — GABAPENTIN 100 MG PO CAPS
200.0000 mg | ORAL_CAPSULE | Freq: Every morning | ORAL | Status: DC
  Administered 2015-06-09 – 2015-06-10 (×2): 200 mg via ORAL
  Filled 2015-06-08 (×2): qty 2

## 2015-06-08 MED ORDER — OXYCODONE HCL 5 MG PO TABS: 5.0000 mg | ORAL_TABLET | Freq: Four times a day (QID) | ORAL | Status: DC | PRN

## 2015-06-08 MED ORDER — DOCUSATE SODIUM 100 MG PO CAPS: 100.0000 mg | ORAL_CAPSULE | Freq: Two times a day (BID) | ORAL | Status: DC | PRN

## 2015-06-08 MED ORDER — DULOXETINE HCL 60 MG PO CPEP
60.0000 mg | ORAL_CAPSULE | Freq: Every day | ORAL | Status: DC
  Administered 2015-06-08 – 2015-06-10 (×3): 60 mg via ORAL
  Filled 2015-06-08 (×3): qty 1

## 2015-06-08 MED ORDER — NALOXONE HCL 1 MG/ML IJ SOLN
INTRAMUSCULAR | Status: AC
  Filled 2015-06-08: qty 2

## 2015-06-08 MED ORDER — ASPIRIN 81 MG PO CHEW
81.0000 mg | CHEWABLE_TABLET | Freq: Every day | ORAL | Status: DC
  Administered 2015-06-08 – 2015-06-10 (×3): 81 mg via ORAL
  Filled 2015-06-08 (×3): qty 1

## 2015-06-08 MED ORDER — ACETAMINOPHEN 650 MG RE SUPP: 650.0000 mg | Freq: Four times a day (QID) | RECTAL | Status: DC | PRN

## 2015-06-08 MED ORDER — MONTELUKAST SODIUM 10 MG PO TABS
10.0000 mg | ORAL_TABLET | Freq: Every day | ORAL | Status: DC
Start: 2015-06-08 — End: 2015-06-10
  Administered 2015-06-08 – 2015-06-10 (×3): 10 mg via ORAL
  Filled 2015-06-08 (×3): qty 1

## 2015-06-08 MED ORDER — INSULIN ASPART 100 UNIT/ML ~~LOC~~ SOLN
0.0000 [IU] | Freq: Every day | SUBCUTANEOUS | Status: DC
  Administered 2015-06-08: 23:00:00 2 [IU] via SUBCUTANEOUS
  Filled 2015-06-08: qty 2

## 2015-06-08 MED ORDER — POTASSIUM CHLORIDE CRYS ER 20 MEQ PO TBCR
20.0000 meq | EXTENDED_RELEASE_TABLET | Freq: Every day | ORAL | Status: DC
  Administered 2015-06-08 – 2015-06-09 (×2): 20 meq via ORAL
  Filled 2015-06-08 (×2): qty 1

## 2015-06-08 MED ORDER — ENOXAPARIN SODIUM 40 MG/0.4ML ~~LOC~~ SOLN
40.0000 mg | Freq: Two times a day (BID) | SUBCUTANEOUS | Status: DC
  Filled 2015-06-08 (×2): qty 0.4

## 2015-06-08 MED ORDER — TIOTROPIUM BROMIDE MONOHYDRATE 18 MCG IN CAPS
18.0000 ug | ORAL_CAPSULE | Freq: Every day | RESPIRATORY_TRACT | Status: DC
  Administered 2015-06-10: 09:00:00 18 ug via RESPIRATORY_TRACT
  Filled 2015-06-08: qty 5

## 2015-06-08 MED ORDER — MOMETASONE FURO-FORMOTEROL FUM 100-5 MCG/ACT IN AERO
2.0000 | INHALATION_SPRAY | Freq: Two times a day (BID) | RESPIRATORY_TRACT | Status: DC
  Administered 2015-06-08 – 2015-06-10 (×4): 2 via RESPIRATORY_TRACT
  Filled 2015-06-08: qty 8.8

## 2015-06-08 MED ORDER — ACETAMINOPHEN 325 MG PO TABS
650.0000 mg | ORAL_TABLET | Freq: Three times a day (TID) | ORAL | Status: DC
  Administered 2015-06-08 – 2015-06-10 (×6): 650 mg via ORAL
  Filled 2015-06-08 (×5): qty 2

## 2015-06-08 MED ORDER — CYCLOBENZAPRINE HCL 10 MG PO TABS: 5.0000 mg | ORAL_TABLET | Freq: Three times a day (TID) | ORAL | Status: DC | PRN

## 2015-06-08 MED ORDER — ROPINIROLE HCL 0.25 MG PO TABS
2.0000 mg | ORAL_TABLET | Freq: Every day | ORAL | Status: DC
  Administered 2015-06-08 – 2015-06-09 (×2): 2 mg via ORAL
  Filled 2015-06-08 (×2): qty 8

## 2015-06-08 MED ORDER — IPRATROPIUM-ALBUTEROL 0.5-2.5 (3) MG/3ML IN SOLN
RESPIRATORY_TRACT | Status: AC
  Filled 2015-06-08: qty 3

## 2015-06-08 MED ORDER — GUAIFENESIN 100 MG/5ML PO SYRP: 400.0000 mg | ORAL_SOLUTION | Freq: Three times a day (TID) | ORAL | Status: DC | PRN

## 2015-06-08 MED ORDER — AMMONIUM LACTATE 12 % EX LOTN
TOPICAL_LOTION | Freq: Two times a day (BID) | CUTANEOUS | Status: DC
  Administered 2015-06-08 – 2015-06-10 (×4): via TOPICAL
  Filled 2015-06-08: qty 400

## 2015-06-08 MED ORDER — POLYVINYL ALCOHOL 1.4 % OP SOLN: 1.0000 [drp] | Freq: Four times a day (QID) | OPHTHALMIC | Status: DC | PRN

## 2015-06-08 MED ORDER — AMIODARONE HCL 200 MG PO TABS
200.0000 mg | ORAL_TABLET | Freq: Every day | ORAL | Status: DC
  Administered 2015-06-08 – 2015-06-10 (×3): 200 mg via ORAL
  Filled 2015-06-08 (×4): qty 1

## 2015-06-08 MED ORDER — SODIUM CHLORIDE 0.9 % IV SOLN
1.0000 g | INTRAVENOUS | Status: DC
  Administered 2015-06-09 – 2015-06-10 (×2): 1 g via INTRAVENOUS
  Filled 2015-06-08 (×4): qty 1

## 2015-06-08 MED ORDER — INSULIN ASPART 100 UNIT/ML ~~LOC~~ SOLN
0.0000 [IU] | Freq: Three times a day (TID) | SUBCUTANEOUS | Status: DC
  Administered 2015-06-09: 12:00:00 2 [IU] via SUBCUTANEOUS
  Administered 2015-06-09: 1 [IU] via SUBCUTANEOUS
  Filled 2015-06-08: qty 2
  Filled 2015-06-08: qty 1

## 2015-06-08 MED ORDER — GABAPENTIN 100 MG PO CAPS
100.0000 mg | ORAL_CAPSULE | Freq: Every day | ORAL | Status: DC
  Administered 2015-06-08 – 2015-06-09 (×2): 100 mg via ORAL
  Filled 2015-06-08 (×2): qty 1

## 2015-06-08 MED ORDER — ACETAMINOPHEN 325 MG PO TABS: 650.0000 mg | ORAL_TABLET | Freq: Four times a day (QID) | ORAL | Status: DC | PRN

## 2015-06-08 MED ORDER — IPRATROPIUM-ALBUTEROL 0.5-2.5 (3) MG/3ML IN SOLN: 3.0000 mL | Freq: Four times a day (QID) | RESPIRATORY_TRACT | Status: DC | PRN

## 2015-06-08 MED ORDER — ONDANSETRON 4 MG PO TBDP: 4.0000 mg | ORAL_TABLET | Freq: Four times a day (QID) | ORAL | Status: DC | PRN

## 2015-06-08 MED ORDER — LEVOTHYROXINE SODIUM 75 MCG PO TABS
150.0000 ug | ORAL_TABLET | Freq: Every day | ORAL | Status: DC
  Administered 2015-06-09 – 2015-06-10 (×2): 150 ug via ORAL
  Filled 2015-06-08 (×2): qty 2

## 2015-06-08 MED ORDER — VITAMIN C 500 MG PO TABS
500.0000 mg | ORAL_TABLET | Freq: Two times a day (BID) | ORAL | Status: DC
  Administered 2015-06-08 – 2015-06-10 (×5): 500 mg via ORAL
  Filled 2015-06-08 (×5): qty 1

## 2015-06-08 MED ORDER — RIVAROXABAN 20 MG PO TABS
20.0000 mg | ORAL_TABLET | Freq: Every day | ORAL | Status: DC
  Administered 2015-06-08 – 2015-06-09 (×2): 20 mg via ORAL
  Filled 2015-06-08 (×2): qty 1

## 2015-06-08 MED ORDER — BUPROPION HCL ER (XL) 150 MG PO TB24
150.0000 mg | ORAL_TABLET | Freq: Every day | ORAL | Status: DC
  Administered 2015-06-08 – 2015-06-10 (×3): 150 mg via ORAL
  Filled 2015-06-08 (×3): qty 1

## 2015-06-08 MED ORDER — IPRATROPIUM-ALBUTEROL 0.5-2.5 (3) MG/3ML IN SOLN
3.0000 mL | Freq: Once | RESPIRATORY_TRACT | Status: AC
  Administered 2015-06-08: 3 mL via RESPIRATORY_TRACT

## 2015-06-08 MED ORDER — SIMVASTATIN 20 MG PO TABS
10.0000 mg | ORAL_TABLET | Freq: Every day | ORAL | Status: DC
  Administered 2015-06-08 – 2015-06-09 (×2): 10 mg via ORAL
  Filled 2015-06-08 (×2): qty 1

## 2015-06-08 MED ORDER — PANTOPRAZOLE SODIUM 40 MG PO TBEC
40.0000 mg | DELAYED_RELEASE_TABLET | Freq: Every day | ORAL | Status: DC
  Administered 2015-06-08 – 2015-06-10 (×3): 40 mg via ORAL
  Filled 2015-06-08 (×3): qty 1

## 2015-06-08 MED ORDER — GABAPENTIN 100 MG PO CAPS
200.0000 mg | ORAL_CAPSULE | Freq: Every day | ORAL | Status: DC
  Administered 2015-06-08 – 2015-06-09 (×2): 200 mg via ORAL
  Filled 2015-06-08 (×2): qty 2

## 2015-06-08 MED ORDER — VITAMIN D (ERGOCALCIFEROL) 1.25 MG (50000 UNIT) PO CAPS
50000.0000 [IU] | ORAL_CAPSULE | ORAL | Status: DC
  Administered 2015-06-10: 09:00:00 50000 [IU] via ORAL
  Filled 2015-06-08: qty 1

## 2015-06-08 NOTE — ED Notes (Signed)
Patient to ED via EMS from [facility] for altered mental status. Patient is able to answer questions correctly x 4. States she does remember the staff trying to wake her up but states she was tired and doesn't feel well. Oxygen saturation is 86-88% on room air. Placed on oxygen and saturation is 92%. Patient denies N/V/D. Skin warm/dry. Patient answers questions and goes back to sleep quickly. Will awaken when name is called.

## 2015-06-08 NOTE — ED Notes (Signed)
Pt more awake since 2nd dose of Narcan, pt now restless and picking at skin, no distress noted, skin warm and dry, I&O cath preformed, urine sample obtained and pt tolerated well

## 2015-06-08 NOTE — Clinical Social Work Note (Signed)
Clinical Social Work Assessment  Patient Details  Name: [name] MRN: [id number] Date of Birth: December 11, 1940  Date of referral:  06/08/15               Reason for consult:  Facility Placement                Permission sought to share information with:  Family Supports, Chartered certified accountant granted to share information::  Yes, Verbal Permission Granted  Name::        Agency::     Relationship::     Contact Information:     Housing/Transportation Living arrangements for the past 2 months:  [place] of Information:  Patient Patient Interpreter Needed:  None Criminal Activity/Legal Involvement Pertinent to Current Situation/Hospitalization:  No - Comment as needed Significant Relationships:  Adult Children Lives with:  Facility Resident Do you feel safe going back to the place where you live?  Yes Need for family participation in patient care:  No (Coment)  Care giving concerns:  Patient is a long term resident at [facility].    Social Worker assessment / plan:  CSW met with patient this afternoon who was alert and oriented X4. Patient is known to CSW from a previous admission. Patient states she intends to discharge back to [facility] when time and that she is aware that she may discharge tomorrow. CSW has notified Surveyor, minerals at [facility] via text and [employer]. Patient requests that [place] notify her daughter on day of discharge: Inetta Fermo: [phone] or [phone]. Patient wishes to transport via EMS when time.   Employment status:  Retired Nurse, adult PT Recommendations:  Not assessed at this time Information / Referral to community resources:     Patient/Family's Response to care:  Patient expresses appreciation for CSW assistance.  Patient/Family's Understanding of and Emotional Response to Diagnosis, Current Treatment, and Prognosis:  Patient able to express that the episode she  experienced earlier at the facility scared her. She states she is feeling much better.    Emotional Assessment Appearance:  Appears stated age Attitude/Demeanor/Rapport:   (pleasant and cooperative) Affect (typically observed):  Accepting, Adaptable, Appropriate Orientation:  Oriented to Self, Oriented to Place, Oriented to  Time, Oriented to Situation Alcohol / Substance use:  Not Applicable Psych involvement (Current and /or in the community):  No (Comment)  Discharge Needs  Concerns to be addressed:  Care Coordination Readmission within the last 30 days:  Yes Current discharge risk:  None Barriers to Discharge:  No Barriers Identified   Shela Leff, LCSW 06/08/2015, 1:30 PM

## 2015-06-08 NOTE — ED Provider Notes (Signed)
[facility] Emergency Department Provider Note  ____________________________________________  Time seen: Approximately 7:24 AM  I have reviewed the triage vital signs and the nursing notes.   HISTORY  Chief Complaint Altered Mental Status    HPI [name] is a 74 y.o. female long list of medical issues including COPD, recent ESBL urinary tract infection, atrial fibrillation on Xarelto.  Patient comes from her nursing home where she was noted to be more confused and sleepy this morning. Patient reports she feels fine but very tired. She denies being in pain except for low back pain which she has been having chronically. Denies having any fevers or chills. No trouble breathing. No chest pain.  Nothing makes her sleepiness better or worse.  Nursing home nurse practitioner reports patient was started on oxycodone yesterday   Past Medical History  Diagnosis Date  . Thyroid disease   . Depression   . Hypertension   . Lumbar disc disease   . Cervical disc disease   . Dyslipidemia   . History of ETOH abuse     dependence with withdrawl seizures  . MI, acute, non ST segment elevation   . Sleep apnea   . Neuropathy   . Anemia   . GERD (gastroesophageal reflux disease)   . Diabetes mellitus without complication   . Restless leg syndrome   . Unspecified psychosis   . CHF (congestive heart failure)   . COPD (chronic obstructive pulmonary disease)   . Atrial fibrillation   . Acute respiratory failure   . Hyperlipidemia     Patient Active Problem List   Diagnosis Date Noted  . Sepsis 05/27/2015  . Pressure ulcer 05/27/2015  . OSA on CPAP 04/11/2015  . Abdominal pain, unspecified site 10/25/2013  . Gallstone 10/25/2013  . Chronic diastolic CHF (congestive heart failure) 09/06/2013  . Edema 01/14/2012  . Hypotension 01/14/2012  . Atrial flutter 06/19/2011  . CAD (coronary artery disease) 06/19/2011  . Hyperlipidemia 06/19/2011  . Restless leg  06/19/2011  . Mental status change 06/19/2011    Past Surgical History  Procedure Laterality Date  . Cervical disc surgery  03/2011    [facility]  . Thyroidectomy    . Tonsillectomy    . Replacement total knee      right knee  . Cardiac catheterization  05/2011    [facility] by Dr Mariah Milling  . Coronary angioplasty  05/2011    stent  . Cardioversion    . Colonoscopy      Current Outpatient Rx  Name  Route  Sig  Dispense  Refill  . acetaminophen (TYLENOL) 325 MG tablet   Oral   Take 650 mg by mouth 3 (three) times daily.          Marland Kitchen amiodarone (PACERONE) 200 MG tablet   Oral   Take 200 mg by mouth daily.          Marland Kitchen ammonium lactate (LAC-HYDRIN) 12 % lotion   Topical   Apply topically 2 (two) times daily.   400 g   0   . aspirin 81 MG chewable tablet   Oral   Chew 81 mg by mouth daily.         Marland Kitchen buPROPion (WELLBUTRIN XL) 150 MG 24 hr tablet   Oral   Take 150 mg by mouth daily.         . Cholecalciferol (VITAMIN D3) 50000 UNITS CAPS   Oral   Take 50,000 Units by mouth once a week.  Take on Sundays.         Marland Kitchen docusate sodium (COLACE) 100 MG capsule   Oral   Take 100 mg by mouth every 12 (twelve) hours as needed for mild constipation or moderate constipation.          . DULoxetine (CYMBALTA) 60 MG capsule   Oral   Take 60 mg by mouth daily.          . ertapenem 1 g in sodium chloride 0.9 % 50 mL   Intravenous   Inject 1 g into the vein daily.   11 Dose   0   . Fluticasone-Salmeterol (ADVAIR) 250-50 MCG/DOSE AEPB   Inhalation   Inhale 1 puff into the lungs 2 (two) times daily.         . furosemide (LASIX) 20 MG tablet   Oral   Take 40 mg by mouth 2 (two) times daily. Take on Monday, Tuesday, Wednesday, Thursday, Friday, and Saturday.         . gabapentin (NEURONTIN) 100 MG capsule   Oral   Take 100-200 mg by mouth See admin instructions. Take 2 capsules (200mg ) orally in the morning, Take 1 capsule orally in the afternoon, and Take 2 capsules  (200mg ) orally at bedtime.         Marland Kitchen guaifenesin (HUMIBID E) 400 MG TABS tablet   Oral   Take 400 mg by mouth every 8 (eight) hours as needed (for cough.).         Marland Kitchen ipratropium-albuterol (DUONEB) 0.5-2.5 (3) MG/3ML SOLN   Nebulization   Take 3 mLs by nebulization every 6 (six) hours as needed (for congestion.).         Marland Kitchen levothyroxine (SYNTHROID, LEVOTHROID) 150 MCG tablet   Oral   Take 150 mcg by mouth daily.          Marland Kitchen loperamide (IMODIUM A-D) 2 MG tablet   Oral   Take 2 mg by mouth every 6 (six) hours as needed for diarrhea or loose stools.          . magnesium oxide (MAG-OX) 400 MG tablet   Oral   Take 400 mg by mouth daily.         . Melatonin 5 MG TABS   Oral   Take 5 mg by mouth at bedtime.         . metFORMIN (GLUCOPHAGE) 500 MG tablet   Oral   Take 250 mg by mouth daily.          . metoprolol (LOPRESSOR) 50 MG tablet   Oral   Take 25 mg by mouth daily.         . montelukast (SINGULAIR) 10 MG tablet   Oral   Take 10 mg by mouth daily.          . Omeprazole 20 MG TBEC   Oral   Take 20 mg by mouth 2 (two) times daily.         . ondansetron (ZOFRAN-ODT) 4 MG disintegrating tablet   Oral   Take 4 mg by mouth every 6 (six) hours as needed for nausea or vomiting.         . OxyCODONE HCl, Abuse Deter, (OXAYDO) 5 MG TABA   Oral   Take 5 mg by mouth every 4 (four) hours as needed (for moderate to severe pain.).   30 tablet   0   . OXYCONTIN 20 MG T12A 12 hr tablet   Oral   Take 1 tablet  by mouth 2 (two) times daily.           Dispense as written.   Marland Kitchen POLYETHYLENE GLYCOL 3350 PO   Oral   Take 17 g by mouth at bedtime.         . polyvinyl alcohol (LIQUITEARS) 1.4 % ophthalmic solution   Both Eyes   Place 1 drop into both eyes every 6 (six) hours as needed for dry eyes.         . potassium chloride SA (K-DUR,KLOR-CON) 20 MEQ tablet   Oral   Take 20 mEq by mouth daily. Take on Monday, Tuesday, Wednesday, Thursday, Friday,  and Saturday.         . Rivaroxaban (XARELTO) 20 MG TABS tablet   Oral   Take 20 mg by mouth daily.          Marland Kitchen rOPINIRole (REQUIP) 1 MG tablet   Oral   Take 1 mg by mouth daily.          Marland Kitchen rOPINIRole (REQUIP) 2 MG tablet   Oral   Take 2 mg by mouth at bedtime.         . Simethicone 80 MG TABS   Oral   Take 80 mg by mouth every 6 (six) hours as needed (for flatulence.).         Marland Kitchen simvastatin (ZOCOR) 10 MG tablet   Oral   Take 10 mg by mouth at bedtime.          Marland Kitchen tiotropium (SPIRIVA) 18 MCG inhalation capsule   Inhalation   Place 18 mcg into inhaler and inhale daily.         . vitamin C (ASCORBIC ACID) 500 MG tablet   Oral   Take 500 mg by mouth 2 (two) times daily.            Allergies Amiodarone; Lisinopril; and Latex  Family History  Problem Relation Age of Onset  . Stroke Mother   . Cancer Father   . Hepatitis Brother     b    Social History Social History  Substance Use Topics  . Smoking status: Current Every Day Smoker -- 0.50 packs/day for 54 years    Types: Cigarettes  . Smokeless tobacco: Never Used  . Alcohol Use: No    Review of Systems Constitutional: No fever/chills Eyes: No visual changes. ENT: No sore throat. Cardiovascular: Denies chest pain. Respiratory: Denies shortness of breath. Gastrointestinal: No abdominal pain.  No nausea, no vomiting.  No diarrhea.  No constipation. Genitourinary: Negative for dysuria. States she is unsure inferior tract infection. Musculoskeletal: Chronic low back pain, unchanged. Skin: Negative for rash. Neurological: Negative for headaches, focal weakness or numbness.  10-point ROS otherwise negative.  ____________________________________________   PHYSICAL EXAM:  VITAL SIGNS: ED Triage Vitals  Enc Vitals Group     BP 06/08/15 0642 110/95 mmHg     Pulse Rate 06/08/15 0642 82     Resp --      Temp 06/08/15 0642 98.1 F (36.7 C)     Temp Source 06/08/15 0642 Oral     SpO2 06/08/15  0642 86 %     Weight 06/08/15 0642 261 lb (118.389 kg)     Height 06/08/15 0642 5\' 3"  (1.6 m)     Head Cir --      Peak Flow --      Pain Score 06/08/15 0645 0     Pain Loc --      Pain Edu? --  Excl. in GC? --     Constitutional: Somnolent, awakes to voice and is oriented 4 able to hold conversation for about 5-7 seconds with an falls back asleep. Eyes: Conjunctivae are normal. PERRL. EOMI. Head: Atraumatic. Nose: No congestion/rhinnorhea. Mouth/Throat: Mucous membranes are dry.  Oropharynx non-erythematous. Neck: No stridor.   Cardiovascular: Normal rate, regular rhythm. Grossly normal heart sounds.  Good peripheral circulation. Respiratory: Slightly shallow respirations, takes good respirations all week but poor respirations while asleep and that her respiratory rate drops to about 10-12. Lungs are clear.  No retractions. Lungs CTAB. Gastrointestinal: Soft and nontender. No distention. No abdominal bruits. No CVA tenderness. Musculoskeletal: No lower extremity tenderness nor edema.  There is a superficial skin ulcer from pressure noted over the left posterior heel without erythema. No joint effusions. Neurologic:  Normal speech and language. No gross focal neurologic deficits are appreciated. Moves all extremities with good strength when asked. Denies numbness or tingling. No facial droop. Skin:  Skin is warm, dry and intact. No rash noted. Psychiatric: Mood and affect are calm and sleepy. Speech and behavior are normal while awake.  ____________________________________________   LABS (all labs ordered are listed, but only abnormal results are displayed)  Labs Reviewed  COMPREHENSIVE METABOLIC PANEL - Abnormal; Notable for the following:    Chloride 95 (*)    CO2 34 (*)    Glucose, Bld 134 (*)    BUN 34 (*)    Creatinine, Ser 1.45 (*)    Albumin 2.6 (*)    AST 12 (*)    ALT 13 (*)    GFR calc non Af Amer 35 (*)    GFR calc Af Amer 41 (*)    All other components within  normal limits  CBC WITH DIFFERENTIAL/PLATELET - Abnormal; Notable for the following:    WBC 14.4 (*)    Hemoglobin 11.0 (*)    HCT 34.6 (*)    MCHC 31.7 (*)    RDW 16.5 (*)    Platelets 522 (*)    Neutro Abs 12.3 (*)    All other components within normal limits  URINALYSIS COMPLETEWITH MICROSCOPIC ([facility] ONLY) - Abnormal; Notable for the following:    Color, Urine YELLOW (*)    APPearance CLOUDY (*)    Protein, ur 100 (*)    Leukocytes, UA TRACE (*)    Bacteria, UA MANY (*)    Squamous Epithelial / LPF TOO NUMEROUS TO COUNT (*)    All other components within normal limits  URINE CULTURE  TROPONIN I   ____________________________________________  EKG  ED ECG REPORT I, QUALE, MARK, the attending physician, personally viewed and interpreted this ECG.  Date: 06/08/2015 EKG Time: 640 Rate: 85 Rhythm: normal sinus rhythm QRS Axis: normal Intervals: normal ST/T Wave abnormalities: There is slight depressions noted in left lateral precordial leads, slight artifact also noted but no obvious acute ischemic change except for very minimal depressions in lateral leads Conduction Disutrbances: none Narrative Interpretation: Normal sinus rhythm, slight artifact, nonspecific abnormality which is quite minimal seen in left lateral leads  ____________________________________________  RADIOLOGY  IMPRESSION: Cardiomegaly and aortic tortuosity, unchanged. No superimposed acute cardiopulmonary findings are evident. Satisfactorily positioned right upper extremity PICC line.    IMPRESSION: Stable atrophy. Atrophy is most pronounced in the left temporal and both parietal lobes. Mild periventricular small vessel disease. No intracranial mass, hemorrhage, or acute appearing infarct.____________________________________________   PROCEDURES  Procedure(s) performed: None  Critical Care performed: No  ____________________________________________   INITIAL IMPRESSION / ASSESSMENT  AND  PLAN / ED COURSE  Pertinent labs & imaging results that were available during my care of the patient were reviewed by me and considered in my medical decision making (see chart for details).  Patient presents with somnolence, hypoxia reported by nursing facility and EMS. Her lungs are remarkably clear, but respirations somewhat shallow. In consideration of recent oxycodone, is possible this could be related. We have given naloxone 2 doses, the patient does show some improvement but still somnolent. It's very difficult to know if this could be from a slight narcotic overdose, though I suspect is probably more from a mixed picture from underlying infectious versus metabolic etiology in combination with new narcotic. The patient does take oxycodone and extended release oxycodone per nursing home paperwork, although last doses are not reported.  We will check labs, I will treat her with DuoNeb abs and Solu-Medrol because of her hypoxia and a history of COPD. We'll continue to further work her up. There is no evidence of congestive heart failure by exam or x-ray. Continue rule out other infectious, metabolic, cardiac, pulmonary etiologies.   ----------------------------------------- 8:55 AM on 06/08/2015 -----------------------------------------  Please overall status does appear to be improving. Based on her previous hypoxia, some slight ongoing somnolence I suspect the patient's likely suffering from multifactorial etiologies causing mild encephalopathy. We'll admit to the hospital service for further workup. White blood cell count is stable, urinalysis does not appear grossly infected, I suspect her urinary tract infection is the effected we treated with ertapenem.  Case and care discussed with Dr. Hilton Sinclair. ____________________________________________   FINAL CLINICAL IMPRESSION(S) / ED DIAGNOSES  Final diagnoses:  Hypoxia  Somnolence  General weakness  Acute kidney injury (nontraumatic)       Sharyn Creamer, MD 06/08/15 [phone]

## 2015-06-08 NOTE — H&P (Signed)
[facility] [place] [facility]   PATIENT NAME: [name]    MR#:  [id number]  DATE OF BIRTH:  03/17/1941  DATE OF ADMISSION:  06/08/2015  PRIMARY CARE PHYSICIAN: Stephanie Acre, MD   REQUESTING/REFERRING PHYSICIAN: Sharyn Creamer, MD  CHIEF COMPLAINT:   Chief Complaint  Patient presents with  . Altered Mental Status    HISTORY OF PRESENT ILLNESS:  [name]  is a 74 y.o. female with a known history of recent hospitalization with ESBL in the urine culture. She's been having increasing back pain in the right back. Apparently she was started on OxyContin. She was sent into the ER with altered mental status. The ER physician gave a couple doses of Narcan. The patient was still with altered mental status. When I saw the patient she was able to talk with me and to give me a history. She states that she felt like she was going to die. This could be a component of the Narcan causing OP avoid withdrawal. Her pulse ox when she came in was 86% on room air.  PAST MEDICAL HISTORY:   Past Medical History  Diagnosis Date  . Thyroid disease   . Depression   . Hypertension   . Lumbar disc disease   . Cervical disc disease   . Dyslipidemia   . History of ETOH abuse     dependence with withdrawl seizures  . MI, acute, non ST segment elevation   . Sleep apnea   . Neuropathy   . Anemia   . GERD (gastroesophageal reflux disease)   . Diabetes mellitus without complication   . Restless leg syndrome   . Unspecified psychosis   . CHF (congestive heart failure)   . COPD (chronic obstructive pulmonary disease)   . Atrial fibrillation   . Acute respiratory failure   . Hyperlipidemia     PAST SURGICAL HISTORY:   Past Surgical History  Procedure Laterality Date  . Cervical disc surgery  03/2011    [facility]  . Thyroidectomy    . Tonsillectomy    . Replacement total knee      right knee  . Cardiac catheterization  05/2011    [facility] by Dr Mariah Milling  . Coronary  angioplasty  05/2011    stent  . Cardioversion    . Colonoscopy      SOCIAL HISTORY:   Social History  Substance Use Topics  . Smoking status: Current Every Day Smoker -- 0.50 packs/day for 54 years    Types: Cigarettes  . Smokeless tobacco: Never Used  . Alcohol Use: No    FAMILY HISTORY:   Family History  Problem Relation Age of Onset  . Stroke Mother   . Cancer Father   . Hepatitis Brother     b    DRUG ALLERGIES:   Allergies  Allergen Reactions  . Amiodarone     Intolerance   . Lisinopril Other (See Comments)    Unknown reaction  . Latex Itching and Rash    REVIEW OF SYSTEMS:  CONSTITUTIONAL: No fever, positive for fatigue and weakness.  EYES: No blurred or double vision. Wears reading glasses EARS, NOSE, AND THROAT: No tinnitus or ear pain. No sore throat. Occasional dysphagia RESPIRATORY: No cough, some shortness of breath, no wheezing or hemoptysis.  CARDIOVASCULAR: No chest pain, orthopnea, edema.  GASTROINTESTINAL: No nausea, vomiting, diarrhea. Some abdominal pain. No blood in bowel movements GENITOURINARY: Positive for dysuria, no hematuria.  ENDOCRINE: No polyuria, nocturia,  HEMATOLOGY:  No anemia, easy bruising or bleeding SKIN: No rash or lesion. MUSCULOSKELETAL: Right back pain NEUROLOGIC: No tingling, numbness, weakness.  PSYCHIATRY: No anxiety or depression.   MEDICATIONS AT HOME:   Prior to Admission medications   Medication Sig Start Date End Date Taking? Authorizing Provider  acetaminophen (TYLENOL) 325 MG tablet Take 650 mg by mouth 3 (three) times daily.    Yes Historical Provider, MD  amiodarone (PACERONE) 200 MG tablet Take 200 mg by mouth daily.    Yes Historical Provider, MD  ammonium lactate (LAC-HYDRIN) 12 % lotion Apply topically 2 (two) times daily. 05/31/15  Yes Alford Highland, MD  aspirin 81 MG chewable tablet Chew 81 mg by mouth daily.   Yes Historical Provider, MD  buPROPion (WELLBUTRIN XL) 150 MG 24 hr tablet Take 150 mg by  mouth daily.   Yes Historical Provider, MD  Cholecalciferol (VITAMIN D3) 50000 UNITS CAPS Take 50,000 Units by mouth once a week. Take on Sundays.   Yes Historical Provider, MD  docusate sodium (COLACE) 100 MG capsule Take 100 mg by mouth every 12 (twelve) hours as needed for mild constipation or moderate constipation.    Yes Historical Provider, MD  DULoxetine (CYMBALTA) 60 MG capsule Take 60 mg by mouth daily.    Yes Historical Provider, MD  ertapenem 1 g in sodium chloride 0.9 % 50 mL Inject 1 g into the vein daily. 05/31/15  Yes Alford Highland, MD  Fluticasone-Salmeterol (ADVAIR) 250-50 MCG/DOSE AEPB Inhale 1 puff into the lungs 2 (two) times daily.   Yes Historical Provider, MD  furosemide (LASIX) 20 MG tablet Take 40 mg by mouth 2 (two) times daily. Take on Monday, Tuesday, Wednesday, Thursday, Friday, and Saturday.   Yes Historical Provider, MD  gabapentin (NEURONTIN) 100 MG capsule Take 100-200 mg by mouth See admin instructions. Take 2 capsules ( ) orally in the morning, Take 1 capsule orally in the afternoon, and Take 2 capsules ( ) orally at bedtime.   Yes Historical Provider, MD  guaifenesin (HUMIBID E) 400 MG TABS tablet Take 400 mg by mouth every 8 (eight) hours as needed (for cough.).   Yes Historical Provider, MD  ipratropium-albuterol (DUONEB) 0.5-2.5 (3) MG/3ML SOLN Take 3 mLs by nebulization every 6 (six) hours as needed (for congestion.).   Yes Historical Provider, MD  levothyroxine (SYNTHROID, LEVOTHROID) 150 MCG tablet Take 150 mcg by mouth daily.    Yes Historical Provider, MD  loperamide (IMODIUM A-D) 2 MG tablet Take 2 mg by mouth every 6 (six) hours as needed for diarrhea or loose stools.    Yes Historical Provider, MD  magnesium oxide (MAG-OX) 400 MG tablet Take 400 mg by mouth daily.   Yes Historical Provider, MD  Melatonin 5 MG TABS Take 5 mg by mouth at bedtime.   Yes Historical Provider, MD  metFORMIN (GLUCOPHAGE) 500 MG tablet Take 250 mg by mouth daily.    Yes  Historical Provider, MD  metoprolol (LOPRESSOR) 50 MG tablet Take 25 mg by mouth daily.   Yes Historical Provider, MD  montelukast (SINGULAIR) 10 MG tablet Take 10 mg by mouth daily.    Yes Historical Provider, MD  Omeprazole 20 MG TBEC Take 20 mg by mouth 2 (two) times daily.   Yes Historical Provider, MD  ondansetron (ZOFRAN-ODT) 4 MG disintegrating tablet Take 4 mg by mouth every 6 (six) hours as needed for nausea or vomiting.   Yes Historical Provider, MD  OxyCODONE HCl, Abuse Deter, (OXAYDO) 5 MG TABA Take 5 mg by mouth  every 4 (four) hours as needed (for moderate to severe pain.). 05/31/15  Yes Alycen Mack Renae Gloss, MD  OXYCONTIN 20 MG T12A 12 hr tablet Take 1 tablet by mouth 2 (two) times daily. 06/06/15  Yes Historical Provider, MD  POLYETHYLENE GLYCOL 3350 PO Take 17 g by mouth at bedtime.   Yes Historical Provider, MD  polyvinyl alcohol (LIQUITEARS) 1.4 % ophthalmic solution Place 1 drop into both eyes every 6 (six) hours as needed for dry eyes.   Yes Historical Provider, MD  potassium chloride SA (K-DUR,KLOR-CON) 20 MEQ tablet Take 20 mEq by mouth daily. Take on Monday, Tuesday, Wednesday, Thursday, Friday, and Saturday.   Yes Historical Provider, MD  Rivaroxaban (XARELTO) 20 MG TABS tablet Take 20 mg by mouth daily.    Yes Historical Provider, MD  rOPINIRole (REQUIP) 1 MG tablet Take 1 mg by mouth daily.    Yes Historical Provider, MD  rOPINIRole (REQUIP) 2 MG tablet Take 2 mg by mouth at bedtime.   Yes Historical Provider, MD  Simethicone 80 MG TABS Take 80 mg by mouth every 6 (six) hours as needed (for flatulence.).   Yes Historical Provider, MD  simvastatin (ZOCOR) 10 MG tablet Take 10 mg by mouth at bedtime.    Yes Historical Provider, MD  tiotropium (SPIRIVA) 18 MCG inhalation capsule Place 18 mcg into inhaler and inhale daily.   Yes Historical Provider, MD  vitamin C (ASCORBIC ACID) 500 MG tablet Take 500 mg by mouth 2 (two) times daily.    Yes Historical Provider, MD      VITAL SIGNS:   Blood pressure 105/88, pulse 74, temperature 98.1 F (36.7 C), temperature source Oral, resp. rate 20, height 5\' 3"  (1.6 m), weight 118.389 kg (261 lb), SpO2 96 %.  PHYSICAL EXAMINATION:  GENERAL:  74 y.o.-year-old patient lying in the bed with no acute distress.  EYES: Pupils equal, round, reactive to light and accommodation. No scleral icterus. Extraocular muscles intact.  HEENT: Head atraumatic, normocephalic. Oropharynx and nasopharynx clear.  NECK:  Supple, no jugular venous distention. No thyroid enlargement, no tenderness.  LUNGS: Normal breath sounds bilaterally, no wheezing, rales,rhonchi or crepitation. No use of accessory muscles of respiration.  CARDIOVASCULAR: S1, S2 normal. No murmurs, rubs, or gallops.  ABDOMEN: Soft, nontender, nondistended. Bowel sounds present. No organomegaly or mass.  EXTREMITIES: Positive for pedal edema, no cyanosis, or clubbing. Back pain in the right back seems more musculoskeletal area. NEUROLOGIC: Cranial nerves II through XII are intact. Muscle strength 5/5 in all extremities. Sensation intact. Gait not checked.  PSYCHIATRIC: The patient is alert and oriented x 3.  SKIN: No rash, lesion, or ulcer.   LABORATORY PANEL:   CBC  Recent Labs Lab 06/08/15 0722  WBC 14.4*  HGB 11.0*  HCT 34.6*  PLT 522*   ------------------------------------------------------------------------------------------------------------------  Chemistries   Recent Labs Lab 06/08/15 0722  NA 139  K 5.0  CL 95*  CO2 34*  GLUCOSE 134*  BUN 34*  CREATININE 1.45*  CALCIUM 9.0  AST 12*  ALT 13*  ALKPHOS 85  BILITOT 0.4   ------------------------------------------------------------------------------------------------------------------  Cardiac Enzymes  Recent Labs Lab 06/08/15 0722  TROPONINI <0.03   ------------------------------------------------------------------------------------------------------------------  RADIOLOGY:  Ct Head Wo  Contrast  06/08/2015   CLINICAL DATA:  Lethargy/altered mental status  EXAM: CT HEAD WITHOUT CONTRAST  TECHNIQUE: Contiguous axial images were obtained from the base of the skull through the vertex without intravenous contrast.  COMPARISON:  January 09, 2015  FINDINGS: The ventricles are normal in size  and configuration. There is asymmetric temporal region atrophy on the left compared to the right, stable. There is also moderate atrophy in both parietal lobes in a symmetric manner. There is no intracranial mass, hemorrhage, extra-axial fluid collection, or midline shift. There is slight small vessel disease in the centra semiovale bilaterally. No acute infarct evident. Bony calvarium appears intact. The mastoid air cells are clear.  IMPRESSION: Stable atrophy. Atrophy is most pronounced in the left temporal and both parietal lobes. Mild periventricular small vessel disease. No intracranial mass, hemorrhage, or acute appearing infarct.   Electronically Signed   By: Bretta Bang III M.D.   On: 06/08/2015 08:43   Dg Chest Portable 1 View  06/08/2015   CLINICAL DATA:  Altered mental status.  EXAM: PORTABLE CHEST - 1 VIEW  COMPARISON:  05/29/2015  FINDINGS: There is a right upper extremity PICC line extending into the SVC. There is unchanged right hemidiaphragm elevation. There is unchanged moderate aortic tortuosity and moderate cardiomegaly. No superimposed acute infiltrate is evident. Pulmonary vasculature is normal. No large effusions are evident.  IMPRESSION: Cardiomegaly and aortic tortuosity, unchanged. No superimposed acute cardiopulmonary findings are evident. Satisfactorily positioned right upper extremity PICC line.   Electronically Signed   By: Ellery Plunk M.D.   On: 06/08/2015 07:02    EKG:   Normal sinus rhythm 83 bpm no acute ST-T wave changes  IMPRESSION AND PLAN:   1. Acute encephalopathy, likely due to OxyContin. The patient was given Narcan 2 doses and may actually be an opiate  withdrawal at this point. I will continue oxycodone as needed for pain. DC OxyContin. Patient will be admitted as an observation and likely go back to the nursing home tomorrow. 2. Acute respiratory failure- I think this was secondary to OxyContin. Check pulse ox in the a.m. 3. ESBL urinary infection continue Invanz until completion of course 4. COPD- no exacerbation at this point continue nebulizer treatments. 5. Relative hypotension hold blood pressure medications give gentle fluids for right now. Likely this is secondary to the Narcan and opiate withdrawal. 6. Diabetes with neuropathy but on sliding scale for right now and continue gabapentin 7. Hypothyroidism unspecified continue levothyroxine 8. Right back pain- seems like more musculoskeletal me I can try to add low-dose Flexeril. Continue low-dose oxycodone. 9. Hyperlipidemia unspecified continue simvastatin 10. Acute renal insufficiency- gentle IV fluids. Check creatinine in the a.m.   All the records are reviewed and case discussed with ED provider. Management plans discussed with the patient, family and they are in agreement.  CODE STATUS: Full code  TOTAL TIME TAKING CARE OF THIS PATIENT: 55 minutes.    Alford Highland M.D on 06/08/2015 at 9:45 AM  Between 7am to 6pm - Pager - [phone]  After 6pm call admission pager [phone]  [facility]  Office  [phone]  CC: Primary care physician; Stephanie Acre, MD

## 2015-06-08 NOTE — ED Notes (Signed)
Patient sent over from [facility] for altered mental status. Patient is lethargic, opens eyes to movement or touch. When sat up in bed patient is alert and able to answer questions appropriately. Patient states she remembers them trying to wake her up but states she was tired and doesn't feel well. O2 sats on room air 86%

## 2015-06-09 DIAGNOSIS — G9340 Encephalopathy, unspecified: Secondary | ICD-10-CM | POA: Diagnosis not present

## 2015-06-09 LAB — CBC
HEMATOCRIT: 31.1 % — AB (ref 35.0–47.0)
HEMOGLOBIN: 10 g/dL — AB (ref 12.0–16.0)
MCH: 27 pg (ref 26.0–34.0)
MCHC: 32 g/dL (ref 32.0–36.0)
MCV: 84.2 fL (ref 80.0–100.0)
Platelets: 515 10*3/uL — ABNORMAL HIGH (ref 150–440)
RBC: 3.7 MIL/uL — AB (ref 3.80–5.20)
RDW: 16.1 % — ABNORMAL HIGH (ref 11.5–14.5)
WBC: 13.3 10*3/uL — AB (ref 3.6–11.0)

## 2015-06-09 LAB — BASIC METABOLIC PANEL
ANION GAP: 6 (ref 5–15)
BUN: 36 mg/dL — ABNORMAL HIGH (ref 6–20)
CHLORIDE: 100 mmol/L — AB (ref 101–111)
CO2: 31 mmol/L (ref 22–32)
Calcium: 8.9 mg/dL (ref 8.9–10.3)
Creatinine, Ser: 1.1 mg/dL — ABNORMAL HIGH (ref 0.44–1.00)
GFR, EST AFRICAN AMERICAN: 57 mL/min — AB (ref >60)
GFR, EST NON AFRICAN AMERICAN: 49 mL/min — AB (ref >60)
Glucose, Bld: 145 mg/dL — ABNORMAL HIGH (ref 65–99)
POTASSIUM: 5 mmol/L (ref 3.5–5.1)
SODIUM: 137 mmol/L (ref 135–145)

## 2015-06-09 LAB — GLUCOSE, CAPILLARY
GLUCOSE-CAPILLARY: 140 mg/dL — AB (ref 65–99)
GLUCOSE-CAPILLARY: 112 mg/dL — AB (ref 65–99)
Glucose-Capillary: 197 mg/dL — ABNORMAL HIGH (ref 65–99)
Glucose-Capillary: 92 mg/dL (ref 65–99)

## 2015-06-09 LAB — URINE CULTURE: CULTURE: NO GROWTH

## 2015-06-09 NOTE — Progress Notes (Signed)
Notified MD of manual BP 88/62.

## 2015-06-09 NOTE — Plan of Care (Signed)
Problem: Discharge Progression Outcomes Goal: Other Discharge Outcomes/Goals Outcome: Progressing Discharge: pt plans to d/c back to [facility] when able  Pain: no complaints of pain  Hemo : VSS, afebrile, LABS stable awaiting results from today Diet: eating well no complaints Pt very confused during night, progressivley worse as night continued

## 2015-06-09 NOTE — Progress Notes (Signed)
Per MD patient is not medically stable for D/C today and may be ready tomorrow. Patient will return to room 308[address]B at [facility] when medically stable. Clinical Child psychotherapist (CSW) spoke with RN on [address] hall at [facility] who reported that patient can return when stable. CSW will continue to follow and assist as needed.   Jetta Lout, LCSWA [phone]

## 2015-06-09 NOTE — Progress Notes (Signed)
Initial Nutrition Assessment    INTERVENTION:   Meals and Snacks: Cater to patient preferences, encourage protein intake with each meals   NUTRITION DIAGNOSIS:   No nutrition diagnosis at this time  GOAL:   Patient will meet greater than or equal to 90% of their needs   MONITOR:    (Energy Intake, Anthropometrics, Glucose Profile, Digestive System, Electrolyte/Renal Profile)  REASON FOR ASSESSMENT:    (Pressure Ulcer)    ASSESSMENT:    Pt admitted with acute encephalopathy likely due to oxycontin, acute respiratory failure, acute renal insuffiencey  Past Medical History  Diagnosis Date  . Thyroid disease   . Depression   . Hypertension   . Lumbar disc disease   . Cervical disc disease   . Dyslipidemia   . History of ETOH abuse     dependence with withdrawl seizures  . MI, acute, non ST segment elevation   . Sleep apnea   . Neuropathy   . Anemia   . GERD (gastroesophageal reflux disease)   . Diabetes mellitus without complication   . Restless leg syndrome   . Unspecified psychosis   . CHF (congestive heart failure)   . COPD (chronic obstructive pulmonary disease)   . Atrial fibrillation   . Acute respiratory failure   . Hyperlipidemia      Diet Order:  Diet heart healthy/carb modified Room service appropriate?: Yes; Fluid consistency:: Thin   Energy Intake: pt ate 100% at breakfast this AM, recorded po intake 75-100% of meals  Food and Nutrition related history: pt reports good appetite prior to admission, eating 3 meals per day  Skin:   (stage II pressure ulcer on heel and buttock)  Electrolyte and Renal Profile:  Recent Labs Lab 06/08/15 0722 06/09/15 0518  BUN 34* 36*  CREATININE 1.45* 1.10*  NA 139 137  K 5.0 5.0   Glucose Profile:  Recent Labs  06/08/15 1148 06/08/15 2227 06/09/15 0721  GLUCAP 141* 241* 140*   Protein Profile:  Recent Labs Lab 06/08/15 0722  ALBUMIN 2.6*   Meds: NS at 50 ml/hr, ss novolog  Height:   Ht  Readings from Last 1 Encounters:  06/08/15  (1.676 m)    Weight: pt reports wt has been stable  Wt Readings from Last 1 Encounters:  06/08/15 248 lb 8 oz (112.719 kg)    BMI:  Body mass index is 40.13 kg/(m^2).  LOW Care Level  Romelle Starcher MS, [place], LDN [phone] Pager

## 2015-06-09 NOTE — Plan of Care (Signed)
Problem: Discharge Progression Outcomes Goal: Other Discharge Outcomes/Goals Outcome: Progressing Plan of care progress to goal for: 1. Discharge Plan:         In Place and Appropriate.         Plan to Discharge back to [facility]. 2. Pain:         Denies pain. 3. Hemodynamically Stable:         Afebrile.         Hypotensive.         Remains on IVF's.         Remains on IV Antibiotics.         Labs Improving.          4. Complications:         No signs/symptoms of complications noted. 5. Diet:         Healthy heart Diet. Increased Appetite. Tolerating Well. 6. Activity:         Bedrest.

## 2015-06-09 NOTE — Progress Notes (Signed)
Patient ID: [name], female   DOB: [birth year], 74 y.o.   MRN: [id number] [facility] PROGRESS NOTE  PCP: Stephanie Acre, MD  HPI/Subjective: Patient feeling much better today. But her blood pressure has been low. The last blood pressure taken was 88 systolic.  Objective: Filed Vitals:   06/09/15 0901  BP: 128/50  Pulse: 73  Temp: 99.1 F (37.3 C)  Resp: 18    Filed Weights   06/08/15 0642 06/08/15 1119  Weight: 118.389 kg (261 lb) 112.719 kg (248 lb 8 oz)    ROS: Review of Systems  Constitutional: Negative for fever and chills.  Eyes: Negative for blurred vision.  Respiratory: Negative for cough and shortness of breath.   Cardiovascular: Negative for chest pain.  Gastrointestinal: Negative for nausea, vomiting, abdominal pain, diarrhea and constipation.  Genitourinary: Negative for dysuria.  Musculoskeletal: Positive for back pain. Negative for joint pain.  Neurological: Negative for dizziness and headaches.   Exam: Physical Exam  Constitutional: She is oriented to person, place, and time.  HENT:  Nose: No mucosal edema.  Mouth/Throat: No oropharyngeal exudate or posterior oropharyngeal edema.  Eyes: Conjunctivae, EOM and lids are normal. Pupils are equal, round, and reactive to light.  Neck: No JVD present. Carotid bruit is not present. No edema present. No thyroid mass and no thyromegaly present.  Cardiovascular: S1 normal and S2 normal.  Exam reveals no gallop.   No murmur heard. Pulses:      Dorsalis pedis pulses are 2+ on the right side, and 2+ on the left side.  Respiratory: No respiratory distress. She has no wheezes. She has no rhonchi. She has no rales.  GI: Soft. Bowel sounds are normal. There is no tenderness.  Musculoskeletal:       Right ankle: She exhibits swelling.       Left ankle: She exhibits swelling.  Lymphadenopathy:    She has no cervical adenopathy.  Neurological: She is alert and oriented to person, place, and time. No  cranial nerve deficit.  Skin: Skin is warm. No rash noted. Nails show no clubbing.  Psychiatric: She has a normal mood and affect.    Data Reviewed: Basic Metabolic Panel:  Recent Labs Lab 06/08/15 0722 06/09/15 0518  NA 139 137  K 5.0 5.0  CL 95* 100*  CO2 34* 31  GLUCOSE 134* 145*  BUN 34* 36*  CREATININE 1.45* 1.10*  CALCIUM 9.0 8.9   Liver Function Tests:  Recent Labs Lab 06/08/15 0722  AST 12*  ALT 13*  ALKPHOS 85  BILITOT 0.4  PROT 8.0  ALBUMIN 2.6*   CBC:  Recent Labs Lab 06/08/15 0722 06/09/15 0518  WBC 14.4* 13.3*  NEUTROABS 12.3*  --   HGB 11.0* 10.0*  HCT 34.6* 31.1*  MCV 86.0 84.2  PLT 522* 515*   CBG:  Recent Labs Lab 06/08/15 1148 06/08/15 2227 06/09/15 0721 06/09/15 1104  GLUCAP 141* 241* 140* 197*    Recent Results (from the past 240 hour(s))  Urine culture     Status: None   Collection Time: 06/08/15  7:23 AM  Result Value Ref Range Status   Specimen Description URINE, CLEAN CATCH  Final   Special Requests NONE  Final   Culture NO GROWTH 1 DAY  Final   Report Status 06/09/2015 FINAL  Final     Studies: Ct Head Wo Contrast  06/08/2015   CLINICAL DATA:  Lethargy/altered mental status  EXAM: CT HEAD WITHOUT CONTRAST  TECHNIQUE: Contiguous axial images were obtained  from the base of the skull through the vertex without intravenous contrast.  COMPARISON:  January 09, 2015  FINDINGS: The ventricles are normal in size and configuration. There is asymmetric temporal region atrophy on the left compared to the right, stable. There is also moderate atrophy in both parietal lobes in a symmetric manner. There is no intracranial mass, hemorrhage, extra-axial fluid collection, or midline shift. There is slight small vessel disease in the centra semiovale bilaterally. No acute infarct evident. Bony calvarium appears intact. The mastoid air cells are clear.  IMPRESSION: Stable atrophy. Atrophy is most pronounced in the left temporal and both parietal  lobes. Mild periventricular small vessel disease. No intracranial mass, hemorrhage, or acute appearing infarct.   Electronically Signed   By: Bretta Bang III M.D.   On: 06/08/2015 08:43   Dg Chest Portable 1 View  06/08/2015   CLINICAL DATA:  Altered mental status.  EXAM: PORTABLE CHEST - 1 VIEW  COMPARISON:  05/29/2015  FINDINGS: There is a right upper extremity PICC line extending into the SVC. There is unchanged right hemidiaphragm elevation. There is unchanged moderate aortic tortuosity and moderate cardiomegaly. No superimposed acute infiltrate is evident. Pulmonary vasculature is normal. No large effusions are evident.  IMPRESSION: Cardiomegaly and aortic tortuosity, unchanged. No superimposed acute cardiopulmonary findings are evident. Satisfactorily positioned right upper extremity PICC line.   Electronically Signed   By: Ellery Plunk M.D.   On: 06/08/2015 07:02    Scheduled Meds: . acetaminophen  650 mg Oral TID  . amiodarone  200 mg Oral Daily  . ammonium lactate   Topical BID  . aspirin  81 mg Oral Daily  . buPROPion  150 mg Oral Daily  . DULoxetine  60 mg Oral Daily  . ertapenem (INVANZ) IV  1 g Intravenous Q24H  . gabapentin  100 mg Oral QAC supper  . gabapentin  200 mg Oral q morning - 10a  . gabapentin  200 mg Oral QHS  . insulin aspart  0-5 Units Subcutaneous QHS  . insulin aspart  0-9 Units Subcutaneous TID WC  . levothyroxine  150 mcg Oral QAC breakfast  . magnesium oxide  400 mg Oral Daily  . Melatonin  5 mg Oral QHS  . mometasone-formoterol  2 puff Inhalation BID  . montelukast  10 mg Oral Daily  . pantoprazole  40 mg Oral Daily  . rivaroxaban  20 mg Oral Q supper  . rOPINIRole  2 mg Oral QHS  . simvastatin  10 mg Oral QHS  . tiotropium  18 mcg Inhalation Daily  . vitamin C  500 mg Oral BID  . [START ON 06/10/2015] Vitamin D (Ergocalciferol)  50,000 Units Oral Weekly   Continuous Infusions: . sodium chloride 50 mL/hr at 06/08/15 1120     Assessment/Plan:  1. Relative hypotension- continue gentle IV fluid hydration and holding medications that can lower blood pressure. 2. Acute encephalopathy- this has improved. I believe this was secondary to OxyContin. 3. ESBL urinary tract infection- still on IV ertapenem. So far are urine culture is negative 4. Atrial fibrillation- rate controlled on amiodarone, anticoagulated with Xarelto 5. Back pain- try to stick with Tylenol. Short acting oxycodone as needed for pain 6. Hyperlipidemia unspecified continue simvastatin 7. COPD- her respiratory status stable 8. Acute respiratory failure- pulse ox on room air 96% this has resolved. Likely secondary to pain medication. 9. Obesity and sleep apnea, oxygen and CPAP at night.  Code Status:     Code Status Orders  Start     Ordered   06/08/15 0940  Full code   Continuous     06/08/15 0940    Advance Directive Documentation        Most Recent Value   Type of Advance Directive  Healthcare Power of Attorney   Pre-existing out of facility DNR order (yellow form or pink MOST form)     "MOST" Form in Place?       Family Communication: Spoke with daughter on phone Disposition Plan: Likely back to facility tomorrow  Time spent: 30 minutes  Alford Highland  [facility]

## 2015-06-10 DIAGNOSIS — G9340 Encephalopathy, unspecified: Secondary | ICD-10-CM | POA: Diagnosis not present

## 2015-06-10 LAB — GLUCOSE, CAPILLARY: Glucose-Capillary: 98 mg/dL (ref 65–99)

## 2015-06-10 MED ORDER — OXYCODONE HCL 5 MG PO TABS: 5.0000 mg | ORAL_TABLET | Freq: Three times a day (TID) | ORAL | Status: DC | PRN

## 2015-06-10 NOTE — Discharge Summary (Signed)
[facility] - [place] at [facility]   PATIENT NAME: [name]    MR#:  [id number]  DATE OF BIRTH:  1940/12/22  DATE OF ADMISSION:  06/08/2015 ADMITTING PHYSICIAN: Alford Highland, MD  DATE OF DISCHARGE: 06/10/2015  PRIMARY CARE PHYSICIAN: Stephanie Acre, MD    ADMISSION DIAGNOSIS:  Somnolence [R40.0] Hypoxia [R09.02] General weakness [R53.1] Acute kidney injury (nontraumatic) [N17.9]  DISCHARGE DIAGNOSIS:  Active Problems:   Encephalopathy acute   SECONDARY DIAGNOSIS:   Past Medical History  Diagnosis Date  . Thyroid disease   . Depression   . Hypertension   . Lumbar disc disease   . Cervical disc disease   . Dyslipidemia   . History of ETOH abuse     dependence with withdrawl seizures  . MI, acute, non ST segment elevation   . Sleep apnea   . Neuropathy   . Anemia   . GERD (gastroesophageal reflux disease)   . Diabetes mellitus without complication   . Restless leg syndrome   . Unspecified psychosis   . CHF (congestive heart failure)   . COPD (chronic obstructive pulmonary disease)   . Atrial fibrillation   . Acute respiratory failure   . Hyperlipidemia     HOSPITAL COURSE:   1. Acute encephalopathy. Patient was recently started on OxyContin. The patient was sent in for altered mental status. The patient was given 2 doses of Narcan and patient awakened. I believe this was all secondary to too much pain medication. 2. Acute respiratory failure- again I believe this is secondary to too much pain medication. Pulse ox 96% on room air. 3. Hypotension- I was given IV fluid the entire hospital course. Lungs are clear. I am holding Lasix and Toprol at this time. Since the patient is asymptomatic with her low blood pressure I will send back to her facility. 4. ESBL Escherichia coli urine infection- continue IV Invanz to completion and DC PICC line once done with course. 5. Sleep apnea and obesity wears CPAP and oxygen at night. 6. Type 2  diabetes with neuropathy- continue Glucophage and gabapentin 7. Hypothyroidism unspecified continue levothyroxine 8. History of congestive heart failure- no signs on this hospital course. Can restart Lasix as outpatient. Daily weights needed. 9. Back pain likely musculoskeletal- rather stick with Tylenol. Continue when necessary oxycodone short acting.  DISCHARGE CONDITIONS:   Satisfactory  CONSULTS OBTAINED:  None  DRUG ALLERGIES:   Allergies  Allergen Reactions  . Lisinopril Other (See Comments)    Unknown reaction  . Latex Itching and Rash    DISCHARGE MEDICATIONS:   Current Discharge Medication List    START taking these medications   Details  oxyCODONE (OXY IR/ROXICODONE) 5 MG immediate release tablet Take 1 tablet (5 mg total) by mouth every 8 (eight) hours as needed (for moderate to severe pain.). Qty: 30 tablet, Refills: 0      CONTINUE these medications which have NOT CHANGED   Details  acetaminophen (TYLENOL) 325 MG tablet Take 650 mg by mouth 3 (three) times daily.     amiodarone (PACERONE) 200 MG tablet Take 200 mg by mouth daily.     ammonium lactate (LAC-HYDRIN) 12 % lotion Apply topically 2 (two) times daily. Qty: 400 g, Refills: 0    aspirin 81 MG chewable tablet Chew 81 mg by mouth daily.    buPROPion (WELLBUTRIN XL) 150 MG 24 hr tablet Take 150 mg by mouth daily.    Cholecalciferol (VITAMIN D3) 50000 UNITS CAPS Take 50,000 Units by mouth  once a week. Take on Sundays.    docusate sodium (COLACE) 100 MG capsule Take 100 mg by mouth every 12 (twelve) hours as needed for mild constipation or moderate constipation.     DULoxetine (CYMBALTA) 60 MG capsule Take 60 mg by mouth daily.     ertapenem 1 g in sodium chloride 0.9 % 50 mL Inject 1 g into the vein daily. Qty: 11 Dose, Refills: 0    Fluticasone-Salmeterol (ADVAIR) 250-50 MCG/DOSE AEPB Inhale 1 puff into the lungs 2 (two) times daily.    gabapentin (NEURONTIN) 100 MG capsule Take 100-200 mg by  mouth See admin instructions. Take 2 capsules (200mg ) orally in the morning, Take 1 capsule orally in the afternoon, and Take 2 capsules (200mg ) orally at bedtime.    guaifenesin (HUMIBID E) 400 MG TABS tablet Take 400 mg by mouth every 8 (eight) hours as needed (for cough.).    ipratropium-albuterol (DUONEB) 0.5-2.5 (3) MG/3ML SOLN Take 3 mLs by nebulization every 6 (six) hours as needed (for congestion.).    levothyroxine (SYNTHROID, LEVOTHROID) 150 MCG tablet Take 150 mcg by mouth daily.     loperamide (IMODIUM A-D) 2 MG tablet Take 2 mg by mouth every 6 (six) hours as needed for diarrhea or loose stools.     magnesium oxide (MAG-OX) 400 MG tablet Take 400 mg by mouth daily.    Melatonin 5 MG TABS Take 5 mg by mouth at bedtime.    metFORMIN (GLUCOPHAGE) 500 MG tablet Take 250 mg by mouth daily.     montelukast (SINGULAIR) 10 MG tablet Take 10 mg by mouth daily.     Omeprazole 20 MG TBEC Take 20 mg by mouth 2 (two) times daily.    ondansetron (ZOFRAN-ODT) 4 MG disintegrating tablet Take 4 mg by mouth every 6 (six) hours as needed for nausea or vomiting.    POLYETHYLENE GLYCOL 3350 PO Take 17 g by mouth at bedtime.    polyvinyl alcohol (LIQUITEARS) 1.4 % ophthalmic solution Place 1 drop into both eyes every 6 (six) hours as needed for dry eyes.    Rivaroxaban (XARELTO) 20 MG TABS tablet Take 20 mg by mouth daily.     !! rOPINIRole (REQUIP) 1 MG tablet Take 1 mg by mouth daily.     !! rOPINIRole (REQUIP) 2 MG tablet Take 2 mg by mouth at bedtime.    Simethicone 80 MG TABS Take 80 mg by mouth every 6 (six) hours as needed (for flatulence.).    simvastatin (ZOCOR) 10 MG tablet Take 10 mg by mouth at bedtime.     tiotropium (SPIRIVA) 18 MCG inhalation capsule Place 18 mcg into inhaler and inhale daily.    vitamin C (ASCORBIC ACID) 500 MG tablet Take 500 mg by mouth 2 (two) times daily.      !! - Potential duplicate medications found. Please discuss with provider.    STOP taking  these medications     furosemide (LASIX) 20 MG tablet      metoprolol (LOPRESSOR) 50 MG tablet      OxyCODONE HCl, Abuse Deter, (OXAYDO) 5 MG TABA      OXYCONTIN 20 MG T12A 12 hr tablet      potassium chloride SA (K-DUR,KLOR-CON) 20 MEQ tablet          DISCHARGE INSTRUCTIONS:   Follow-up with Dr. rehabilitation 1 day  If you experience worsening of your admission symptoms, develop shortness of breath, life threatening emergency, suicidal or homicidal thoughts you must seek medical attention immediately  by calling 911 or calling your MD immediately  if symptoms less severe.  You Must read complete instructions/literature along with all the possible adverse reactions/side effects for all the Medicines you take and that have been prescribed to you. Take any new Medicines after you have completely understood and accept all the possible adverse reactions/side effects.   Please note  You were cared for by a hospitalist during your hospital stay. If you have any questions about your discharge medications or the care you received while you were in the hospital after you are discharged, you can call the unit and asked to speak with the hospitalist on call if the hospitalist that took care of you is not available. Once you are discharged, your primary care physician will handle any further medical issues. Please note that NO REFILLS for any discharge medications will be authorized once you are discharged, as it is imperative that you return to your primary care physician (or establish a relationship with a primary care physician if you do not have one) for your aftercare needs so that they can reassess your need for medications and monitor your lab values.    Today   CHIEF COMPLAINT:   Chief Complaint  Patient presents with  . Altered Mental Status    HISTORY OF PRESENT ILLNESS:  [name]  is a 74 y.o. female with a known history of multiple pulmonary problems presented with altered  mental status. Patient was given Narcan and she woke up.   VITAL SIGNS:  Blood pressure 98/60, pulse 70, temperature 98.5 F (36.9 C), temperature source Oral, resp. rate 20, height 5\' 6"  (1.676 m), weight 112.719 kg (248 lb 8 oz), SpO2 96 %.    PHYSICAL EXAMINATION:  GENERAL:  74 y.o.-year-old patient lying in the bed with no acute distress.  EYES: Pupils equal, round, reactive to light and accommodation. No scleral icterus. Extraocular muscles intact.  HEENT: Head atraumatic, normocephalic. Oropharynx and nasopharynx clear.  NECK:  Supple, no jugular venous distention. No thyroid enlargement, no tenderness.  LUNGS: Normal breath sounds bilaterally, no wheezing, rales,rhonchi or crepitation. No use of accessory muscles of respiration.  CARDIOVASCULAR: S1, S2 normal. No murmurs, rubs, or gallops.  ABDOMEN: Soft, non-tender, non-distended. Bowel sounds present. No organomegaly or mass.  EXTREMITIES: 2+ edema edema, no cyanosis, or clubbing.  NEUROLOGIC: Cranial nerves II through XII are intact. Muscle strength 5/5 in all extremities. Sensation intact. Gait not checked.  PSYCHIATRIC: The patient is alert and oriented x 3.  SKIN: No obvious rash, lesion, or ulcer.   DATA REVIEW:   CBC  Recent Labs Lab 06/09/15 0518  WBC 13.3*  HGB 10.0*  HCT 31.1*  PLT 515*    Chemistries   Recent Labs Lab 06/08/15 0722 06/09/15 0518  NA 139 137  K 5.0 5.0  CL 95* 100*  CO2 34* 31  GLUCOSE 134* 145*  BUN 34* 36*  CREATININE 1.45* 1.10*  CALCIUM 9.0 8.9  AST 12*  --   ALT 13*  --   ALKPHOS 85  --   BILITOT 0.4  --     Cardiac Enzymes  Recent Labs Lab 06/08/15 0722  TROPONINI <0.03    Microbiology Results  Results for orders placed or performed during the hospital encounter of 06/08/15  Urine culture     Status: None   Collection Time: 06/08/15  7:23 AM  Result Value Ref Range Status   Specimen Description URINE, CLEAN CATCH  Final   Special Requests NONE  Final    Culture NO GROWTH 1 DAY  Final   Report Status 06/09/2015 FINAL  Final   Management plans discussed with the patient, and she is in agreement.  CODE STATUS:     Code Status Orders        Start     Ordered   06/08/15 0940  Full code   Continuous     06/08/15 0940    Advance Directive Documentation        Most Recent Value   Type of Advance Directive  Healthcare Power of Attorney   Pre-existing out of facility DNR order (yellow form or pink MOST form)     "MOST" Form in Place?        TOTAL TIME TAKING CARE OF THIS PATIENT: 35 minutes.    Alford Highland M.D on 06/10/2015 at 9:45 AM  Between 7am to 6pm - Pager - [phone]  After 6pm go to [email or web address] - password [email or web address]  [facility] [facility]  Office  [phone]  CC: Primary care physician; Stephanie Acre, MD

## 2015-06-10 NOTE — Plan of Care (Signed)
Problem: Discharge Progression Outcomes Goal: Other Discharge Outcomes/Goals Outcome: Progressing 1. Discharge Plan:              Plan to Discharge back to [facility]. 2. Pain:         Denies pain. 3. Hemodynamically Stable:         Afebrile.         Hypotensive.         Remains on IVF's.         Remains on IV Antibiotics.         Labs Improving.           4. Complications:         No signs/symptoms of complications noted. 5. Diet:         Healthy heart Diet. Increased Appetite. Tolerating Well. 6. Activity:         Bedrest.

## 2015-06-10 NOTE — Progress Notes (Signed)
EMS here at this time to transport pt, pt with no noted complaints at discharge

## 2015-06-10 NOTE — Progress Notes (Signed)
Patient is medically stable for D/C back to [facility] today. Patient is going to room 308-B. RN will call report to 300 hall and arrange EMS for transport. Clinical Child psychotherapist (CSW) prepared D/C packet and faxed D/C Summary to [facility]. CSW spoke with RN at [facility] who reported that patient can return today. Patient is aware of above. CSW contacted patient's daughter Gavin Pound and made her aware of above. Please reconsult if future social work needs arise. CSW signing off.   Jetta Lout, LCSWA [phone]

## 2015-06-10 NOTE — Progress Notes (Signed)
Pt being discharge back to [facility], report called to [place], awaiting EMS for transport, pt with no noted complaints, PICC line remains for IV abts

## 2015-08-05 IMAGING — CT CT HEAD WITHOUT CONTRAST
2 series · 13 of 30 positions shown, 15 images · non-contrast
Comparison: 12/31/2012

CLINICAL DATA: Fall this morning.  Head injury.

EXAM:
CT HEAD WITHOUT CONTRAST
TECHNIQUE: Contiguous axial images were obtained from the base of the skull
through the vertex without intravenous contrast.

[Series 2: head wo · axial · 0.43mm/px · z∈[-158,-55]mm · 5 of 35 slices shown, 7 images]
[im 6/35  brain]
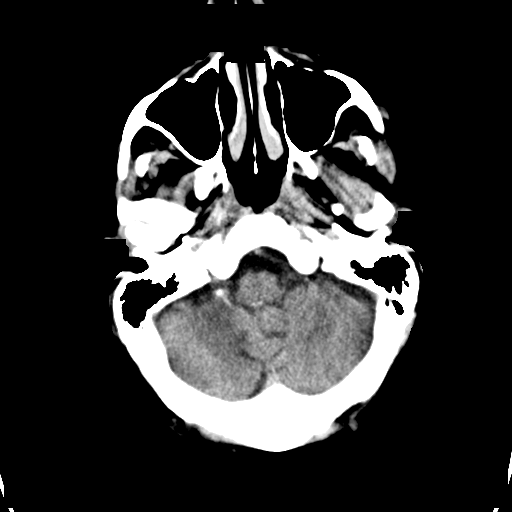
[im 6/35  bone]
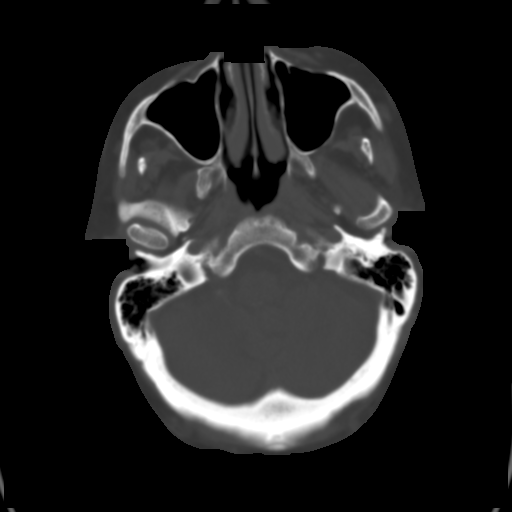
[im 12/35  brain]
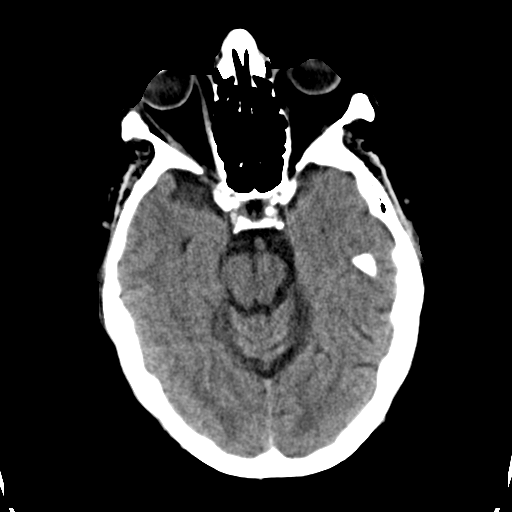
[im 18/35  brain]
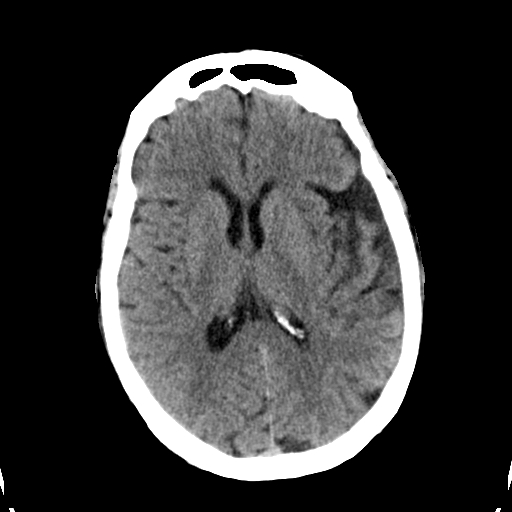
[im 23/35  brain]
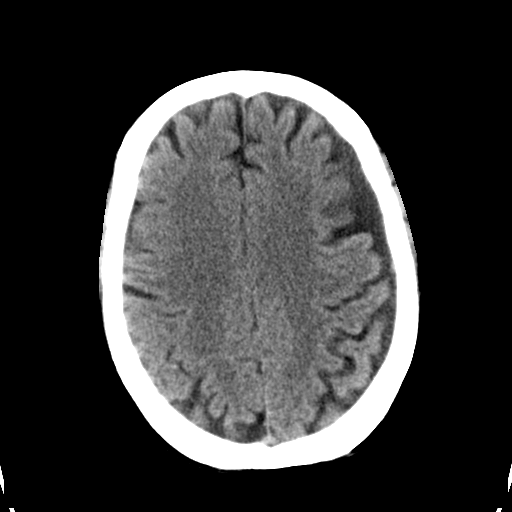
[im 29/35  brain]
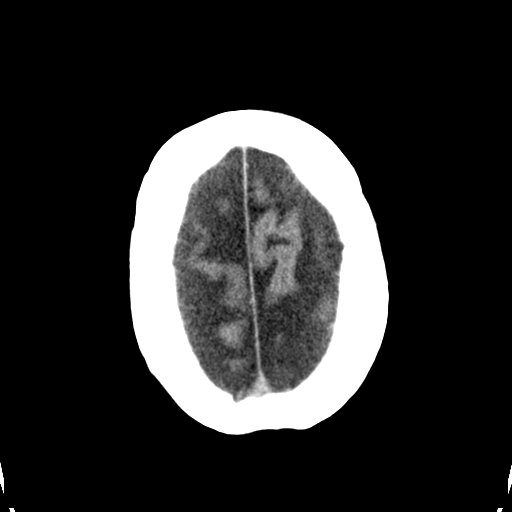
[im 29/35  bone]
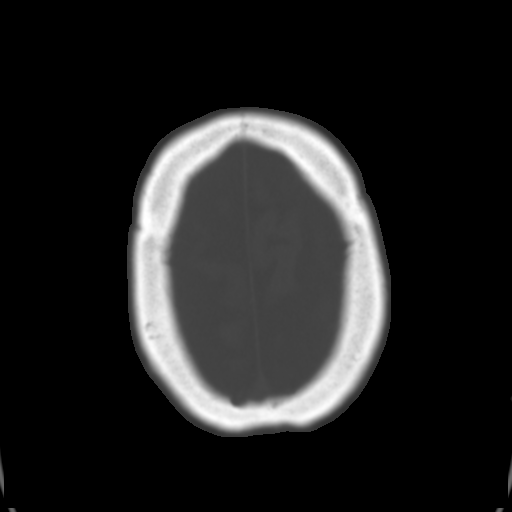

[Series 3: head bone · axial · 0.43mm/px · z∈[-169,-37]mm · 8 of 108 slices shown]
[im 10/108  bone]
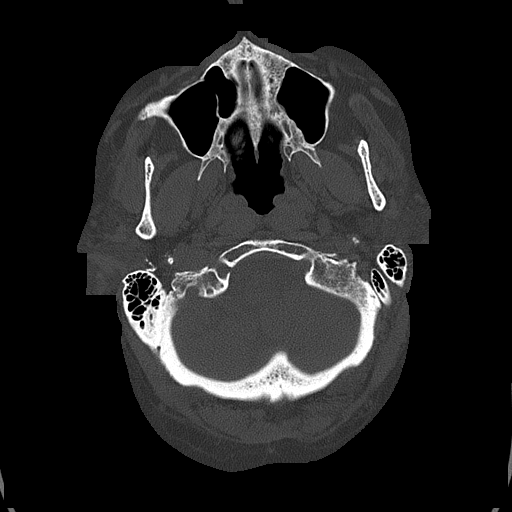
[im 20/108  bone]
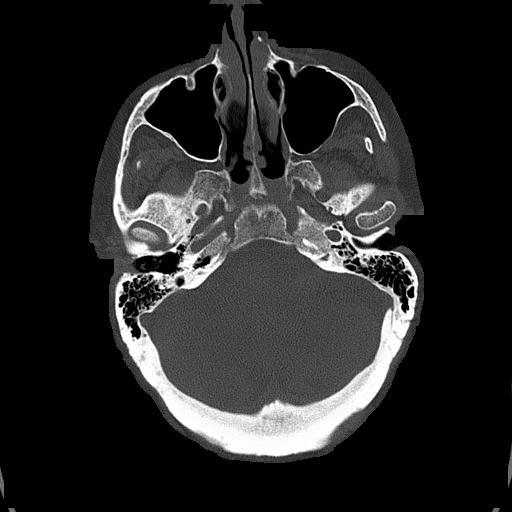
[im 35/108  bone]
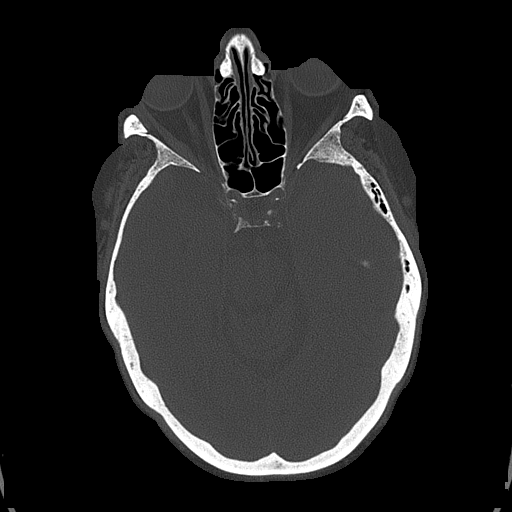
[im 49/108  bone]
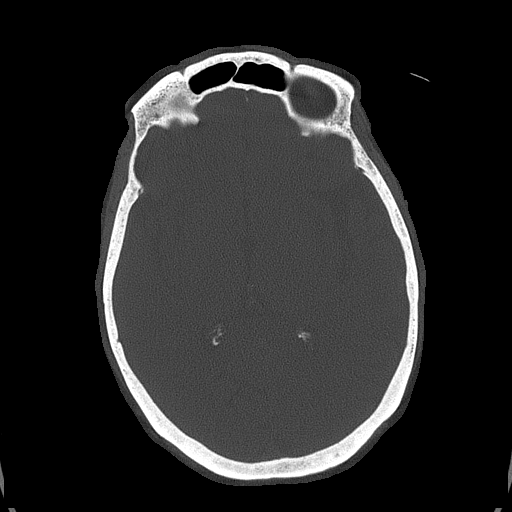
[im 59/108  bone]
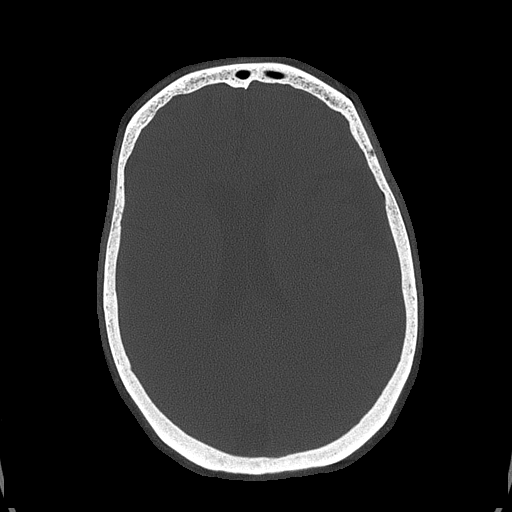
[im 73/108  bone]
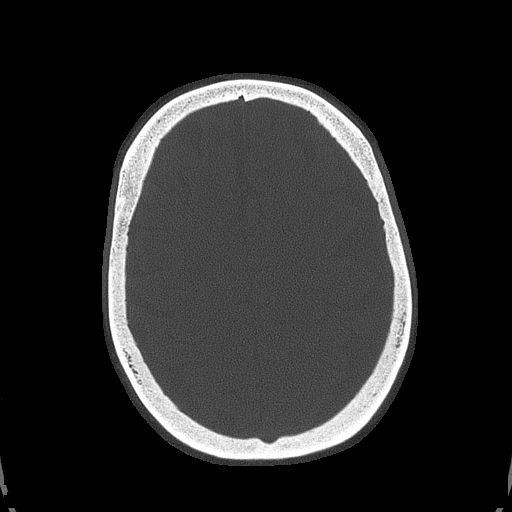
[im 88/108  bone]
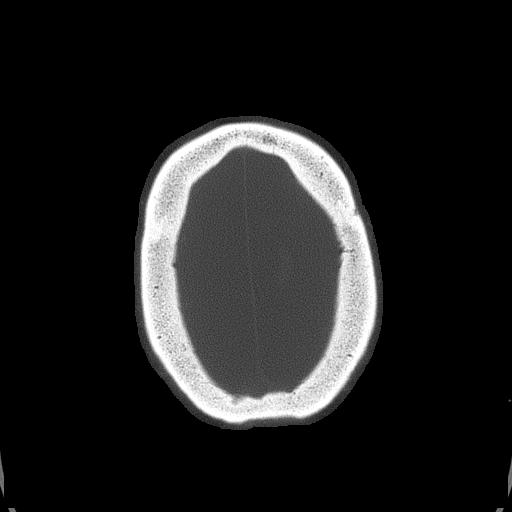
[im 98/108  bone]
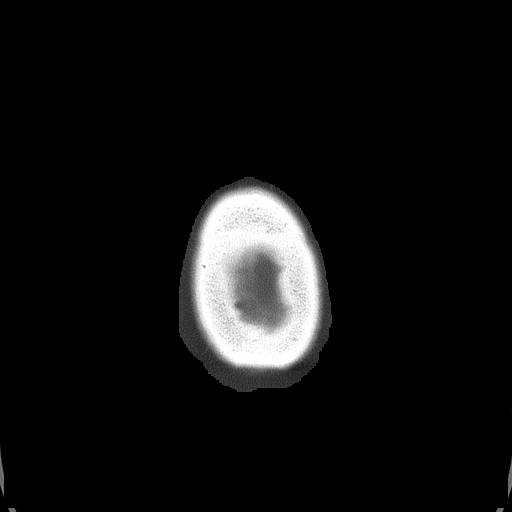

[13 of 30 positions shown; findings below may reference images not displayed]

FINDINGS: Ventricles are normal in configuration. There is ventricular and
sulcal enlargement reflecting age related volume loss. There is
greater volume loss from the left cerebral hemisphere than from the
low right, which is stable.

No parenchymal masses or mass effect. There is no evidence of a
cortical infarct. There are no extra-axial masses or abnormal fluid
collections.

No intracranial hemorrhage.

Visualized sinuses and mastoid air cells are clear. No skull
fracture.
IMPRESSION: 1. No acute intracranial abnormalities.
2. Age related volume loss.
3. No skull fracture.

## 2015-08-27 IMAGING — CR DG CHEST 1V PORT
1 series · 1 of 1 positions shown · non-contrast
Comparison: 01/31/2014

CLINICAL DATA: Shortness of breath.

EXAM:
PORTABLE CHEST - 1 VIEW

[ap]
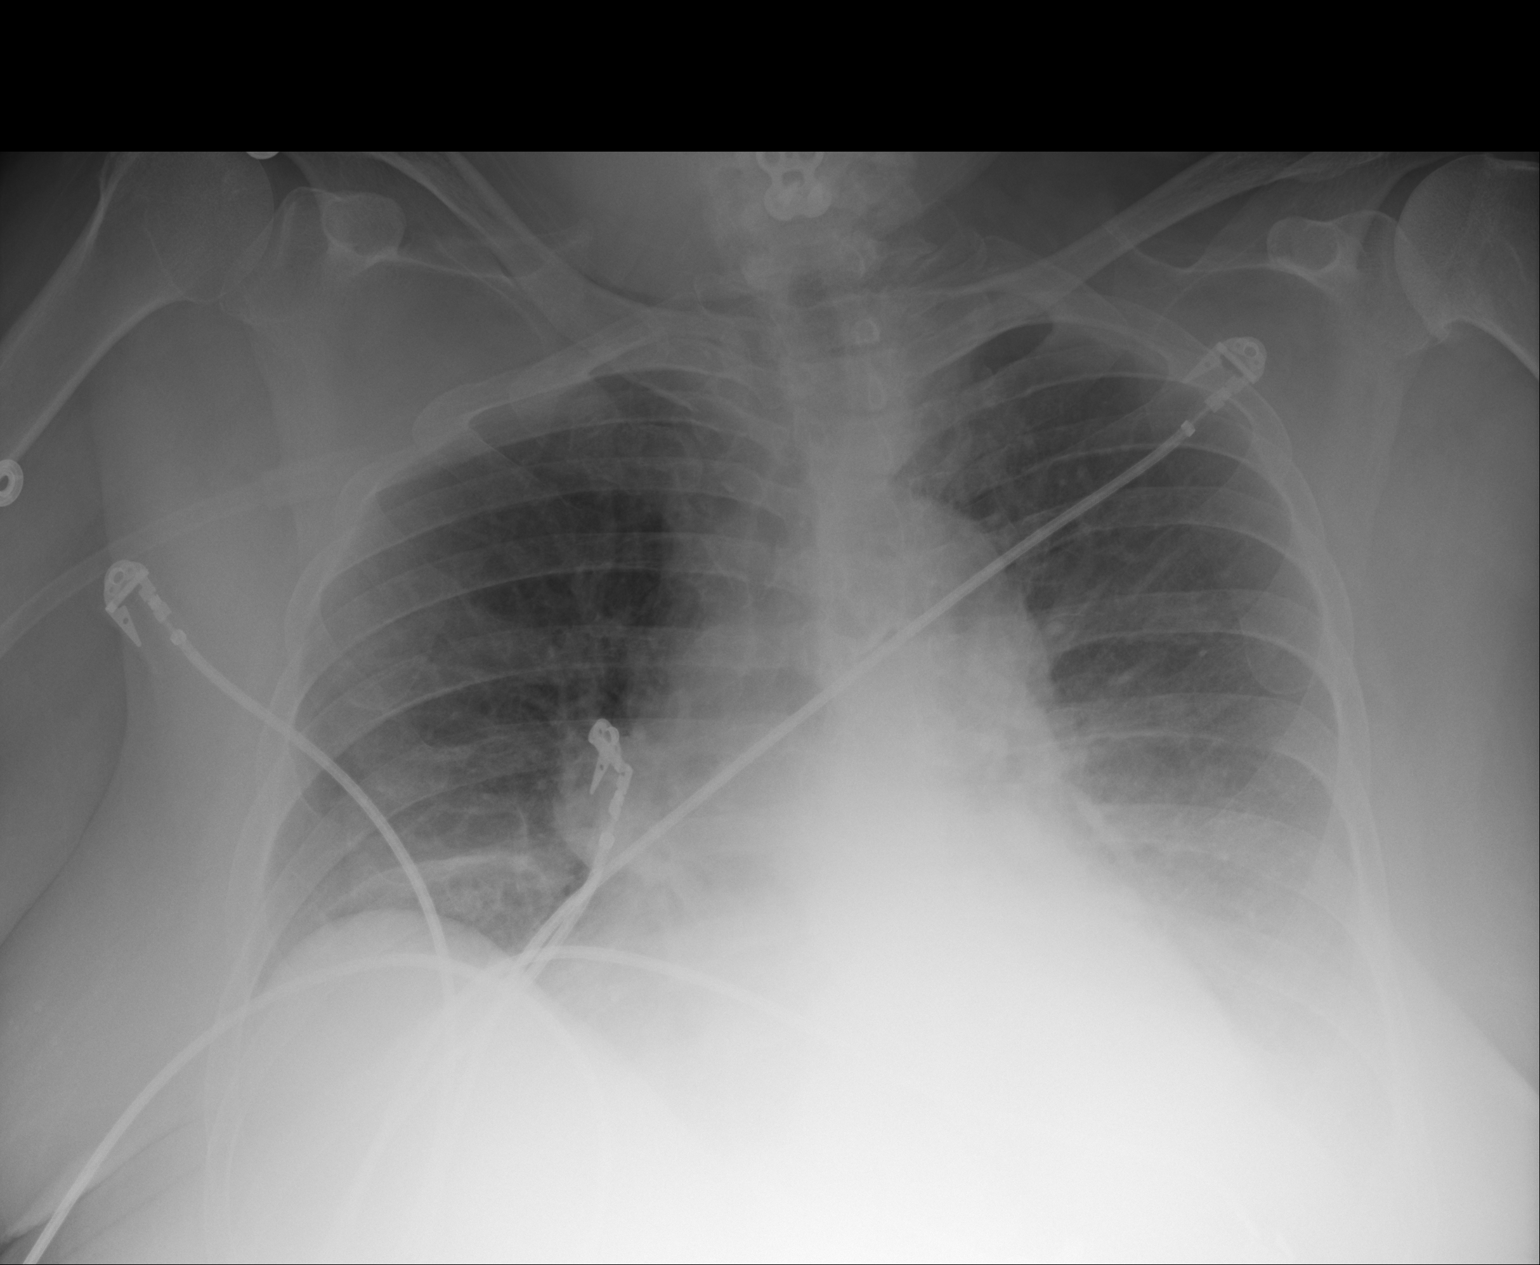

[1 of 1 positions shown; findings below may reference images not displayed]

FINDINGS: Mild cardiac enlargement. There is atelectasis in the right base.
Left pleural effusion is identified.
IMPRESSION: 1. Cardiac enlargement and left pleural effusion.
2. Right base atelectasis.

## 2015-10-12 ENCOUNTER — Ambulatory Visit: Payer: Medicare Other | Admitting: Physician Assistant

## 2015-11-07 ENCOUNTER — Encounter: Payer: Self-pay | Admitting: *Deleted

## 2015-11-07 ENCOUNTER — Ambulatory Visit: Payer: Medicare Other | Admitting: Nurse Practitioner

## 2015-11-07 ENCOUNTER — Encounter: Payer: Self-pay | Admitting: Nurse Practitioner

## 2015-11-07 DIAGNOSIS — I119 Hypertensive heart disease without heart failure: Secondary | ICD-10-CM | POA: Insufficient documentation

## 2015-11-08 ENCOUNTER — Emergency Department: Payer: Medicare Other

## 2015-11-08 ENCOUNTER — Inpatient Hospital Stay
Admission: EM | Admit: 2015-11-08 | Discharge: 2015-11-13 | DRG: 189 | Disposition: A | Discharge status: Skilled Nursing Facility | Payer: Medicare Other | Source: Skilled Nursing Facility | Attending: Internal Medicine | Admitting: Internal Medicine

## 2015-11-08 ENCOUNTER — Encounter: Payer: Self-pay | Admitting: Emergency Medicine

## 2015-11-08 DIAGNOSIS — Z7982 Long term (current) use of aspirin: Secondary | ICD-10-CM

## 2015-11-08 DIAGNOSIS — Z6841 Body Mass Index (BMI) 40.0 and over, adult: Secondary | ICD-10-CM

## 2015-11-08 DIAGNOSIS — I110 Hypertensive heart disease with heart failure: Secondary | ICD-10-CM | POA: Diagnosis present

## 2015-11-08 DIAGNOSIS — G92 Toxic encephalopathy: Secondary | ICD-10-CM | POA: Diagnosis present

## 2015-11-08 DIAGNOSIS — F17210 Nicotine dependence, cigarettes, uncomplicated: Secondary | ICD-10-CM | POA: Diagnosis present

## 2015-11-08 DIAGNOSIS — I482 Chronic atrial fibrillation: Secondary | ICD-10-CM | POA: Diagnosis present

## 2015-11-08 DIAGNOSIS — E875 Hyperkalemia: Secondary | ICD-10-CM | POA: Diagnosis present

## 2015-11-08 DIAGNOSIS — I4892 Unspecified atrial flutter: Secondary | ICD-10-CM | POA: Diagnosis present

## 2015-11-08 DIAGNOSIS — G2581 Restless legs syndrome: Secondary | ICD-10-CM | POA: Diagnosis present

## 2015-11-08 DIAGNOSIS — I2510 Atherosclerotic heart disease of native coronary artery without angina pectoris: Secondary | ICD-10-CM | POA: Diagnosis present

## 2015-11-08 DIAGNOSIS — R0602 Shortness of breath: Secondary | ICD-10-CM | POA: Diagnosis present

## 2015-11-08 DIAGNOSIS — E785 Hyperlipidemia, unspecified: Secondary | ICD-10-CM | POA: Diagnosis present

## 2015-11-08 DIAGNOSIS — I480 Paroxysmal atrial fibrillation: Secondary | ICD-10-CM | POA: Diagnosis present

## 2015-11-08 DIAGNOSIS — J9602 Acute respiratory failure with hypercapnia: Secondary | ICD-10-CM | POA: Diagnosis present

## 2015-11-08 DIAGNOSIS — E860 Dehydration: Secondary | ICD-10-CM | POA: Diagnosis present

## 2015-11-08 DIAGNOSIS — I959 Hypotension, unspecified: Secondary | ICD-10-CM | POA: Diagnosis present

## 2015-11-08 DIAGNOSIS — G4733 Obstructive sleep apnea (adult) (pediatric): Secondary | ICD-10-CM | POA: Diagnosis present

## 2015-11-08 DIAGNOSIS — Z66 Do not resuscitate: Secondary | ICD-10-CM | POA: Diagnosis present

## 2015-11-08 DIAGNOSIS — N170 Acute kidney failure with tubular necrosis: Secondary | ICD-10-CM | POA: Diagnosis present

## 2015-11-08 DIAGNOSIS — T402X5A Adverse effect of other opioids, initial encounter: Secondary | ICD-10-CM | POA: Diagnosis present

## 2015-11-08 DIAGNOSIS — E039 Hypothyroidism, unspecified: Secondary | ICD-10-CM | POA: Diagnosis present

## 2015-11-08 DIAGNOSIS — I252 Old myocardial infarction: Secondary | ICD-10-CM

## 2015-11-08 DIAGNOSIS — I5032 Chronic diastolic (congestive) heart failure: Secondary | ICD-10-CM | POA: Diagnosis present

## 2015-11-08 DIAGNOSIS — D508 Other iron deficiency anemias: Secondary | ICD-10-CM | POA: Diagnosis not present

## 2015-11-08 DIAGNOSIS — Z9981 Dependence on supplemental oxygen: Secondary | ICD-10-CM

## 2015-11-08 DIAGNOSIS — R6881 Early satiety: Secondary | ICD-10-CM | POA: Insufficient documentation

## 2015-11-08 DIAGNOSIS — Z955 Presence of coronary angioplasty implant and graft: Secondary | ICD-10-CM

## 2015-11-08 DIAGNOSIS — K219 Gastro-esophageal reflux disease without esophagitis: Secondary | ICD-10-CM | POA: Diagnosis present

## 2015-11-08 DIAGNOSIS — Z79891 Long term (current) use of opiate analgesic: Secondary | ICD-10-CM

## 2015-11-08 DIAGNOSIS — K921 Melena: Secondary | ICD-10-CM | POA: Insufficient documentation

## 2015-11-08 DIAGNOSIS — D649 Anemia, unspecified: Secondary | ICD-10-CM | POA: Diagnosis present

## 2015-11-08 DIAGNOSIS — Z7901 Long term (current) use of anticoagulants: Secondary | ICD-10-CM

## 2015-11-08 DIAGNOSIS — J9600 Acute respiratory failure, unspecified whether with hypoxia or hypercapnia: Secondary | ICD-10-CM | POA: Diagnosis present

## 2015-11-08 DIAGNOSIS — D500 Iron deficiency anemia secondary to blood loss (chronic): Secondary | ICD-10-CM | POA: Diagnosis present

## 2015-11-08 DIAGNOSIS — K5900 Constipation, unspecified: Secondary | ICD-10-CM | POA: Diagnosis not present

## 2015-11-08 DIAGNOSIS — J441 Chronic obstructive pulmonary disease with (acute) exacerbation: Secondary | ICD-10-CM | POA: Diagnosis present

## 2015-11-08 DIAGNOSIS — E1140 Type 2 diabetes mellitus with diabetic neuropathy, unspecified: Secondary | ICD-10-CM | POA: Diagnosis present

## 2015-11-08 DIAGNOSIS — D509 Iron deficiency anemia, unspecified: Secondary | ICD-10-CM | POA: Diagnosis not present

## 2015-11-08 DIAGNOSIS — F329 Major depressive disorder, single episode, unspecified: Secondary | ICD-10-CM | POA: Diagnosis present

## 2015-11-08 LAB — MRSA PCR SCREENING: MRSA by PCR: NEGATIVE

## 2015-11-08 LAB — PREPARE RBC (CROSSMATCH)

## 2015-11-08 LAB — BLOOD GAS, ARTERIAL
Acid-Base Excess: 8.4 mmol/L — ABNORMAL HIGH (ref 0.0–3.0)
Allens test (pass/fail): POSITIVE — AB
BICARBONATE: 35 meq/L — AB (ref 21.0–28.0)
Delivery systems: POSITIVE
EXPIRATORY PAP: 6
FIO2: 50
INSPIRATORY PAP: 16
O2 SAT: 98.1 %
PCO2 ART: 62 mmHg — AB (ref 32.0–48.0)
PO2 ART: 110 mmHg — AB (ref 83.0–108.0)
Patient temperature: 37
pH, Arterial: 7.36 (ref 7.350–7.450)

## 2015-11-08 LAB — COMPREHENSIVE METABOLIC PANEL
ALBUMIN: 3.3 g/dL — AB (ref 3.5–5.0)
ALK PHOS: 95 U/L (ref 38–126)
ALT: 14 U/L (ref 14–54)
ANION GAP: 9 (ref 5–15)
AST: 14 U/L — ABNORMAL LOW (ref 15–41)
BILIRUBIN TOTAL: 0.5 mg/dL (ref 0.3–1.2)
BUN: 35 mg/dL — ABNORMAL HIGH (ref 6–20)
CALCIUM: 8.4 mg/dL — AB (ref 8.9–10.3)
CO2: 31 mmol/L (ref 22–32)
Chloride: 97 mmol/L — ABNORMAL LOW (ref 101–111)
Creatinine, Ser: 1.52 mg/dL — ABNORMAL HIGH (ref 0.44–1.00)
GFR, EST AFRICAN AMERICAN: 38 mL/min — AB (ref >60)
GFR, EST NON AFRICAN AMERICAN: 33 mL/min — AB (ref >60)
GLUCOSE: 165 mg/dL — AB (ref 65–99)
POTASSIUM: 5.2 mmol/L — AB (ref 3.5–5.1)
SODIUM: 137 mmol/L (ref 135–145)
TOTAL PROTEIN: 8.2 g/dL — AB (ref 6.5–8.1)

## 2015-11-08 LAB — CBC WITH DIFFERENTIAL/PLATELET
BASOS ABS: 0.1 10*3/uL (ref 0–0.1)
BASOS PCT: 1 %
Eosinophils Absolute: 0.1 10*3/uL (ref 0–0.7)
Eosinophils Relative: 1 %
HEMATOCRIT: 24.1 % — AB (ref 35.0–47.0)
HEMOGLOBIN: 7.5 g/dL — AB (ref 12.0–16.0)
LYMPHS PCT: 7 %
Lymphs Abs: 0.7 10*3/uL — ABNORMAL LOW (ref 1.0–3.6)
MCH: 26.1 pg (ref 26.0–34.0)
MCHC: 31 g/dL — ABNORMAL LOW (ref 32.0–36.0)
MCV: 84.2 fL (ref 80.0–100.0)
Monocytes Absolute: 1 10*3/uL — ABNORMAL HIGH (ref 0.2–0.9)
Monocytes Relative: 9 %
NEUTROS ABS: 9.2 10*3/uL — AB (ref 1.4–6.5)
NEUTROS PCT: 82 %
Platelets: 429 10*3/uL (ref 150–440)
RBC: 2.87 MIL/uL — AB (ref 3.80–5.20)
RDW: 17 % — ABNORMAL HIGH (ref 11.5–14.5)
WBC: 11.2 10*3/uL — AB (ref 3.6–11.0)

## 2015-11-08 LAB — TSH: TSH: 6.077 u[IU]/mL — AB (ref 0.350–4.500)

## 2015-11-08 LAB — ABO/RH: ABO/RH(D): O POS

## 2015-11-08 LAB — BRAIN NATRIURETIC PEPTIDE: B Natriuretic Peptide: 147 pg/mL — ABNORMAL HIGH (ref 0.0–100.0)

## 2015-11-08 LAB — FIBRIN DERIVATIVES D-DIMER (ARMC ONLY): Fibrin derivatives D-dimer (ARMC): 1019 — ABNORMAL HIGH (ref 0–499)

## 2015-11-08 LAB — TROPONIN I: Troponin I: 0.03 ng/mL (ref <0.031)

## 2015-11-08 LAB — GLUCOSE, CAPILLARY: Glucose-Capillary: 166 mg/dL — ABNORMAL HIGH (ref 65–99)

## 2015-11-08 MED ORDER — NALOXONE HCL 2 MG/2ML IJ SOSY
PREFILLED_SYRINGE | INTRAMUSCULAR | Status: AC
  Filled 2015-11-08: qty 2

## 2015-11-08 MED ORDER — ONDANSETRON HCL 4 MG PO TABS: 4.0000 mg | ORAL_TABLET | Freq: Four times a day (QID) | ORAL | Status: DC | PRN

## 2015-11-08 MED ORDER — INSULIN ASPART 100 UNIT/ML ~~LOC~~ SOLN
0.0000 [IU] | Freq: Every day | SUBCUTANEOUS | Status: DC
  Administered 2015-11-11: 22:00:00 2 [IU] via SUBCUTANEOUS
  Filled 2015-11-08: qty 2

## 2015-11-08 MED ORDER — DOCUSATE SODIUM 100 MG PO CAPS
100.0000 mg | ORAL_CAPSULE | Freq: Two times a day (BID) | ORAL | Status: DC | PRN
  Administered 2015-11-09 – 2015-11-10 (×2): 100 mg via ORAL
  Filled 2015-11-08 (×2): qty 1

## 2015-11-08 MED ORDER — IOHEXOL 350 MG/ML SOLN
75.0000 mL | Freq: Once | INTRAVENOUS | Status: AC | PRN
  Administered 2015-11-08: 75 mL via INTRAVENOUS

## 2015-11-08 MED ORDER — ACETAMINOPHEN 650 MG RE SUPP: 650.0000 mg | Freq: Four times a day (QID) | RECTAL | Status: DC | PRN

## 2015-11-08 MED ORDER — BUPROPION HCL ER (XL) 150 MG PO TB24
150.0000 mg | ORAL_TABLET | Freq: Every day | ORAL | Status: DC
  Administered 2015-11-08 – 2015-11-13 (×6): 150 mg via ORAL
  Filled 2015-11-08 (×6): qty 1

## 2015-11-08 MED ORDER — CETYLPYRIDINIUM CHLORIDE 0.05 % MT LIQD
7.0000 mL | Freq: Two times a day (BID) | OROMUCOSAL | Status: DC
  Administered 2015-11-08 – 2015-11-13 (×8): 7 mL via OROMUCOSAL

## 2015-11-08 MED ORDER — OXYCODONE HCL 5 MG PO TABS
5.0000 mg | ORAL_TABLET | Freq: Three times a day (TID) | ORAL | Status: DC | PRN
  Administered 2015-11-09 – 2015-11-13 (×7): 5 mg via ORAL
  Filled 2015-11-08 (×7): qty 1

## 2015-11-08 MED ORDER — SODIUM CHLORIDE 0.9% FLUSH: 3.0000 mL | INTRAVENOUS | Status: DC | PRN

## 2015-11-08 MED ORDER — ROPINIROLE HCL 1 MG PO TABS
2.0000 mg | ORAL_TABLET | Freq: Every day | ORAL | Status: DC
  Administered 2015-11-08 – 2015-11-12 (×5): 2 mg via ORAL
  Filled 2015-11-08 (×5): qty 2

## 2015-11-08 MED ORDER — LOPERAMIDE HCL 2 MG PO CAPS: 2.0000 mg | ORAL_CAPSULE | Freq: Four times a day (QID) | ORAL | Status: DC | PRN

## 2015-11-08 MED ORDER — GABAPENTIN 100 MG PO CAPS
100.0000 mg | ORAL_CAPSULE | Freq: Three times a day (TID) | ORAL | Status: DC
  Administered 2015-11-08 – 2015-11-13 (×15): 100 mg via ORAL
  Filled 2015-11-08 (×15): qty 1

## 2015-11-08 MED ORDER — SODIUM CHLORIDE 0.9 % IV SOLN
INTRAVENOUS | Status: AC
  Administered 2015-11-08 – 2015-11-09 (×2): via INTRAVENOUS

## 2015-11-08 MED ORDER — POTASSIUM CHLORIDE CRYS ER 20 MEQ PO TBCR: 20.0000 meq | EXTENDED_RELEASE_TABLET | Freq: Every day | ORAL | Status: DC

## 2015-11-08 MED ORDER — SODIUM CHLORIDE 0.9 % IV SOLN
1000.0000 mL | Freq: Once | INTRAVENOUS | Status: AC
  Administered 2015-11-08: 1000 mL via INTRAVENOUS

## 2015-11-08 MED ORDER — IPRATROPIUM-ALBUTEROL 0.5-2.5 (3) MG/3ML IN SOLN: 3.0000 mL | Freq: Four times a day (QID) | RESPIRATORY_TRACT | Status: DC | PRN

## 2015-11-08 MED ORDER — NALOXONE HCL 2 MG/2ML IJ SOSY
0.4000 mg | PREFILLED_SYRINGE | Freq: Once | INTRAMUSCULAR | Status: AC
  Administered 2015-11-08: 11:00:00 via INTRAVENOUS

## 2015-11-08 MED ORDER — POLYVINYL ALCOHOL 1.4 % OP SOLN
1.0000 [drp] | Freq: Four times a day (QID) | OPHTHALMIC | Status: DC | PRN
  Filled 2015-11-08: qty 15

## 2015-11-08 MED ORDER — INSULIN ASPART 100 UNIT/ML ~~LOC~~ SOLN
0.0000 [IU] | Freq: Three times a day (TID) | SUBCUTANEOUS | Status: DC
  Administered 2015-11-09 (×2): 1 [IU] via SUBCUTANEOUS
  Administered 2015-11-10: 09:00:00 2 [IU] via SUBCUTANEOUS
  Administered 2015-11-10 (×2): 1 [IU] via SUBCUTANEOUS
  Administered 2015-11-11: 12:00:00 2 [IU] via SUBCUTANEOUS
  Administered 2015-11-11: 1 [IU] via SUBCUTANEOUS
  Administered 2015-11-11: 2 [IU] via SUBCUTANEOUS
  Administered 2015-11-12: 3 [IU] via SUBCUTANEOUS
  Administered 2015-11-12: 2 [IU] via SUBCUTANEOUS
  Administered 2015-11-12: 18:00:00 3 [IU] via SUBCUTANEOUS
  Administered 2015-11-13: 1 [IU] via SUBCUTANEOUS
  Administered 2015-11-13: 17:00:00 3 [IU] via SUBCUTANEOUS
  Administered 2015-11-13: 12:00:00 1 [IU] via SUBCUTANEOUS
  Filled 2015-11-08 (×2): qty 1
  Filled 2015-11-08: qty 3
  Filled 2015-11-08: qty 2
  Filled 2015-11-08: qty 3
  Filled 2015-11-08 (×3): qty 2
  Filled 2015-11-08: qty 3
  Filled 2015-11-08: qty 1
  Filled 2015-11-08: qty 3
  Filled 2015-11-08 (×4): qty 1

## 2015-11-08 MED ORDER — SODIUM CHLORIDE 0.9 % IV SOLN: 250.0000 mL | INTRAVENOUS | Status: DC | PRN

## 2015-11-08 MED ORDER — MAGNESIUM OXIDE 400 (241.3 MG) MG PO TABS
400.0000 mg | ORAL_TABLET | Freq: Every day | ORAL | Status: DC
  Administered 2015-11-08 – 2015-11-13 (×6): 400 mg via ORAL
  Filled 2015-11-08 (×6): qty 1

## 2015-11-08 MED ORDER — GUAIFENESIN ER 600 MG PO TB12: 600.0000 mg | ORAL_TABLET | Freq: Two times a day (BID) | ORAL | Status: DC | PRN

## 2015-11-08 MED ORDER — SIMETHICONE 80 MG PO CHEW: 80.0000 mg | CHEWABLE_TABLET | Freq: Four times a day (QID) | ORAL | Status: DC | PRN

## 2015-11-08 MED ORDER — MOMETASONE FURO-FORMOTEROL FUM 100-5 MCG/ACT IN AERO
2.0000 | INHALATION_SPRAY | Freq: Two times a day (BID) | RESPIRATORY_TRACT | Status: DC
  Administered 2015-11-08 – 2015-11-13 (×10): 2 via RESPIRATORY_TRACT
  Filled 2015-11-08: qty 8.8

## 2015-11-08 MED ORDER — ACETAMINOPHEN 325 MG PO TABS
650.0000 mg | ORAL_TABLET | Freq: Four times a day (QID) | ORAL | Status: DC | PRN
  Administered 2015-11-09 – 2015-11-11 (×3): 650 mg via ORAL
  Filled 2015-11-08 (×3): qty 2

## 2015-11-08 MED ORDER — SODIUM CHLORIDE 0.9% FLUSH
3.0000 mL | Freq: Two times a day (BID) | INTRAVENOUS | Status: DC
  Administered 2015-11-08 – 2015-11-12 (×8): 3 mL via INTRAVENOUS

## 2015-11-08 MED ORDER — PANTOPRAZOLE SODIUM 40 MG PO TBEC
40.0000 mg | DELAYED_RELEASE_TABLET | Freq: Every day | ORAL | Status: DC
  Administered 2015-11-08 – 2015-11-09 (×2): 40 mg via ORAL
  Filled 2015-11-08 (×2): qty 1

## 2015-11-08 MED ORDER — DULOXETINE HCL 60 MG PO CPEP
60.0000 mg | ORAL_CAPSULE | Freq: Every day | ORAL | Status: DC
  Administered 2015-11-08 – 2015-11-13 (×6): 60 mg via ORAL
  Filled 2015-11-08 (×6): qty 1

## 2015-11-08 MED ORDER — MONTELUKAST SODIUM 10 MG PO TABS
10.0000 mg | ORAL_TABLET | Freq: Every day | ORAL | Status: DC
  Administered 2015-11-08 – 2015-11-13 (×6): 10 mg via ORAL
  Filled 2015-11-08 (×6): qty 1

## 2015-11-08 MED ORDER — SIMVASTATIN 20 MG PO TABS
10.0000 mg | ORAL_TABLET | Freq: Every day | ORAL | Status: DC
  Administered 2015-11-08 – 2015-11-12 (×5): 10 mg via ORAL
  Filled 2015-11-08 (×5): qty 1

## 2015-11-08 MED ORDER — LEVOTHYROXINE SODIUM 150 MCG PO TABS
150.0000 ug | ORAL_TABLET | Freq: Every day | ORAL | Status: DC
  Administered 2015-11-09 – 2015-11-13 (×5): 150 ug via ORAL
  Filled 2015-11-08 (×6): qty 1

## 2015-11-08 MED ORDER — SODIUM CHLORIDE 0.9 % IV SOLN
10.0000 mL/h | Freq: Once | INTRAVENOUS | Status: AC
  Administered 2015-11-08: 10 mL/h via INTRAVENOUS

## 2015-11-08 MED ORDER — AMIODARONE HCL 200 MG PO TABS
200.0000 mg | ORAL_TABLET | Freq: Every day | ORAL | Status: DC
  Administered 2015-11-09 – 2015-11-13 (×5): 200 mg via ORAL
  Filled 2015-11-08 (×5): qty 1

## 2015-11-08 MED ORDER — ONDANSETRON HCL 4 MG/2ML IJ SOLN: 4.0000 mg | Freq: Four times a day (QID) | INTRAMUSCULAR | Status: DC | PRN

## 2015-11-08 MED ORDER — ASPIRIN 81 MG PO CHEW
81.0000 mg | CHEWABLE_TABLET | Freq: Every day | ORAL | Status: DC
  Administered 2015-11-08 – 2015-11-13 (×6): 81 mg via ORAL
  Filled 2015-11-08 (×6): qty 1

## 2015-11-08 MED ORDER — ALBUTEROL SULFATE (2.5 MG/3ML) 0.083% IN NEBU: 2.5000 mg | INHALATION_SOLUTION | RESPIRATORY_TRACT | Status: DC | PRN

## 2015-11-08 MED ORDER — ZOLPIDEM TARTRATE 5 MG PO TABS
5.0000 mg | ORAL_TABLET | Freq: Every evening | ORAL | Status: DC | PRN
  Administered 2015-11-12: 22:00:00 5 mg via ORAL
  Filled 2015-11-08: qty 1

## 2015-11-08 MED ORDER — TIOTROPIUM BROMIDE MONOHYDRATE 18 MCG IN CAPS
18.0000 ug | ORAL_CAPSULE | Freq: Every day | RESPIRATORY_TRACT | Status: DC
  Administered 2015-11-08 – 2015-11-13 (×6): 18 ug via RESPIRATORY_TRACT
  Filled 2015-11-08 (×2): qty 5

## 2015-11-08 NOTE — Progress Notes (Signed)
Pharmacist - Prescriber Communication  Per [facility] policy, the order for diphenhydramine 25 mg po at bedtime PRN sleep has been substituted to zolpidem 5 mg po at bedtime PRN sleep for patient age greater than 34. Please call pharmacy if you have any questions about this change.  Aydin Cavalieri A. Maddock, [place].D., BCPS Clinical Pharmacist 11/08/[phone]

## 2015-11-08 NOTE — Progress Notes (Signed)
RN called about patients BIPAP and she does not want to wear it, Patient refused to wear BIPAP

## 2015-11-08 NOTE — ED Notes (Signed)
Pt arrived via EMS from [facility] for complaints of SOB. EMS reports SOB began approximately 0845 this am. EMS administered albuterol neb x1 and applied CPAP. EMS 150/95. EMS reports NSR.

## 2015-11-08 NOTE — ED Provider Notes (Signed)
Time Seen: Approximately 1025 I have reviewed the triage notes  Chief Complaint: Shortness of Breath   History of Present Illness: [name] is a 75 y.o. female *who presents very somnolent from local nursing facility. The patient apparently is followed in the hospice program and was transported here to emergency department for altered mental status. Patient's currently very somnolent and not able to offer any significant history or review of systems. EMS was notified due to respiratory distress. They transported the patient on CPAP. There was no identified hypoxia per EMS to the nurse. Review of the medical records shows she had some previous respiratory distress was secondary to narcotic pain medication. I gave her Narcan at the bedside without any symptomatic improvement. She is occasionally arousable and denies any obvious focus of pain.  Past Medical History  Diagnosis Date  . Hypothyroidism   . Depression   . Hypertensive heart disease   . Lumbar disc disease   . Cervical disc disease   . Paroxysmal atrial flutter (HCC)     a. s/p prior DCCV.  Marland Kitchen History of ETOH abuse     dependence with withdrawl seizures  . CAD (coronary artery disease)     a. 05/2011 NSTEMI/PCI: LM nl, LAD nl, LCX 66m (4.0x38 Promus DES), RCA 100 CTO.  Marland Kitchen Obstructive sleep apnea   . Neuropathy (HCC)   . Anemia   . GERD (gastroesophageal reflux disease)   . Diabetes mellitus without complication (HCC)   . Restless leg syndrome   . Unspecified psychosis   . Chronic diastolic CHF (congestive heart failure) (HCC)     a. 02/2012 Echo: EF 55-60%, Gr1 DD, mildly dil LA, PASP .  Marland Kitchen COPD (chronic obstructive pulmonary disease) (HCC)   . PAF (paroxysmal atrial fibrillation) (HCC)     a. CHA2DS2VASc = 6-->amio/xarelto.  . Acute respiratory failure (HCC)     a. 06/2015 in setting of acute encephalopathy/Rx narcotic usage.  . Hyperlipidemia     Patient Active Problem List   Diagnosis Date Noted  . Acute  respiratory failure without hypercapnia (HCC) 11/08/2015  . Anemia 11/08/2015  . Hypertensive heart disease   . Encephalopathy acute 06/08/2015  . Sepsis (HCC) 05/27/2015  . Pressure ulcer 05/27/2015  . OSA on CPAP 04/11/2015  . Abdominal pain, unspecified site 10/25/2013  . Gallstone 10/25/2013  . Chronic diastolic CHF (congestive heart failure) (HCC) 09/06/2013  . Edema 01/14/2012  . Hypotension 01/14/2012  . Atrial flutter (HCC) 06/19/2011  . CAD (coronary artery disease) 06/19/2011  . Hyperlipidemia 06/19/2011  . Restless leg 06/19/2011  . Mental status change 06/19/2011    Past Surgical History  Procedure Laterality Date  . Cervical disc surgery  03/2011    [facility]  . Thyroidectomy    . Tonsillectomy    . Replacement total knee      right knee  . Cardiac catheterization  05/2011    [facility] by Dr Mariah Milling  . Coronary angioplasty  05/2011    stent  . Cardioversion    . Colonoscopy      Past Surgical History  Procedure Laterality Date  . Cervical disc surgery  03/2011    [facility]  . Thyroidectomy    . Tonsillectomy    . Replacement total knee      right knee  . Cardiac catheterization  05/2011    [facility] by Dr Mariah Milling  . Coronary angioplasty  05/2011    stent  . Cardioversion    . Colonoscopy  Current Outpatient Rx  Name  Route  Sig  Dispense  Refill  . acetaminophen (TYLENOL) 325 MG tablet   Oral   Take 650 mg by mouth 3 (three) times daily.          Marland Kitchen amiodarone (PACERONE) 200 MG tablet   Oral   Take 200 mg by mouth daily.          Marland Kitchen aspirin 81 MG chewable tablet   Oral   Chew 81 mg by mouth daily.         Marland Kitchen buPROPion (WELLBUTRIN XL) 150 MG 24 hr tablet   Oral   Take 150 mg by mouth daily.         . Cholecalciferol (VITAMIN D3) 50000 UNITS CAPS   Oral   Take 50,000 Units by mouth once a week. Take on Sundays.         . clobetasol cream (TEMOVATE) 0.05 %   Topical   Apply 1 application topically 2 (two) times daily. BID FOR RASH AND  EVERY 12 HOURS PRN FOR DRY HANDS         . docusate sodium (COLACE) 100 MG capsule   Oral   Take 100 mg by mouth every 12 (twelve) hours as needed for mild constipation or moderate constipation.          . DULoxetine (CYMBALTA) 60 MG capsule   Oral   Take 60 mg by mouth daily.          . Fluticasone-Salmeterol (ADVAIR) 250-50 MCG/DOSE AEPB   Inhalation   Inhale 1 puff into the lungs 2 (two) times daily.         . furosemide (LASIX) 40 MG tablet   Oral   Take 40 mg by mouth daily.         Marland Kitchen gabapentin (NEURONTIN) 100 MG capsule   Oral   Take 100 mg by mouth 3 (three) times daily.          Marland Kitchen guaifenesin (HUMIBID E) 400 MG TABS tablet   Oral   Take 400 mg by mouth every 8 (eight) hours as needed (for cough.).         Marland Kitchen hydrocerin (EUCERIN) CREA   Topical   Apply 1 application topically at bedtime.         Marland Kitchen ipratropium-albuterol (DUONEB) 0.5-2.5 (3) MG/3ML SOLN   Nebulization   Take 3 mLs by nebulization every 6 (six) hours as needed (for congestion.).         Marland Kitchen levothyroxine (SYNTHROID, LEVOTHROID) 150 MCG tablet   Oral   Take 150 mcg by mouth daily.          Marland Kitchen loperamide (IMODIUM A-D) 2 MG tablet   Oral   Take 2 mg by mouth every 6 (six) hours as needed for diarrhea or loose stools.          . magnesium oxide (MAG-OX) 400 MG tablet   Oral   Take 400 mg by mouth daily.         . metFORMIN (GLUCOPHAGE) 500 MG tablet   Oral   Take 250 mg by mouth daily.          . montelukast (SINGULAIR) 10 MG tablet   Oral   Take 10 mg by mouth daily.          . Omeprazole 20 MG TBEC   Oral   Take 20 mg by mouth 2 (two) times daily.         . ondansetron (  ZOFRAN-ODT) 4 MG disintegrating tablet   Oral   Take 4 mg by mouth every 6 (six) hours as needed for nausea or vomiting.         Marland Kitchen oxyCODONE (OXY IR/ROXICODONE) 5 MG immediate release tablet   Oral   Take 1 tablet (5 mg total) by mouth every 8 (eight) hours as needed (for moderate to severe  pain.). Patient taking differently: Take 5 mg by mouth every 4 (four) hours as needed (for moderate to severe pain.).    30 tablet   0   . oxyCODONE (OXY IR/ROXICODONE) 5 MG immediate release tablet   Oral   Take 5 mg by mouth 3 (three) times daily.         Marland Kitchen POLYETHYLENE GLYCOL 3350 PO   Oral   Take 17 g by mouth at bedtime.         . polyvinyl alcohol (LIQUITEARS) 1.4 % ophthalmic solution   Both Eyes   Place 1 drop into both eyes every 6 (six) hours as needed for dry eyes.         . potassium chloride SA (K-DUR,KLOR-CON) 20 MEQ tablet   Oral   Take 20 mEq by mouth daily.         . Rivaroxaban (XARELTO) 20 MG TABS tablet   Oral   Take 20 mg by mouth daily.          Marland Kitchen rOPINIRole (REQUIP) 2 MG tablet   Oral   Take 2 mg by mouth at bedtime.         . Simethicone 80 MG TABS   Oral   Take 80 mg by mouth every 6 (six) hours as needed (for flatulence.).         Marland Kitchen simvastatin (ZOCOR) 10 MG tablet   Oral   Take 10 mg by mouth at bedtime.          . Skin Protectants, Misc. (CALAZIME SKIN PROTECTANT) PSTE   Apply externally   Apply topically.         . tiotropium (SPIRIVA) 18 MCG inhalation capsule   Inhalation   Place 18 mcg into inhaler and inhale daily.         . vitamin C (ASCORBIC ACID) 500 MG tablet   Oral   Take 500 mg by mouth 2 (two) times daily.          . Wound Dressings (MEDIHONEY WOUND/BURN DRESSING) GEL   Apply externally   Apply 1 application topically every three (3) days as needed.         Marland Kitchen ammonium lactate (LAC-HYDRIN) 12 % lotion   Topical   Apply topically 2 (two) times daily.   400 g   0   . ertapenem 1 g in sodium chloride 0.9 % 50 mL   Intravenous   Inject 1 g into the vein daily.   11 Dose   0   . Melatonin 5 MG TABS   Oral   Take 5 mg by mouth at bedtime.         Marland Kitchen rOPINIRole (REQUIP) 1 MG tablet   Oral   Take 2 mg by mouth at bedtime.            Allergies:  Lisinopril and Latex  Family  History: Family History  Problem Relation Age of Onset  . Stroke Mother   . Cancer Father   . Hepatitis Brother     b    Social History: Social History  Substance Use  Topics  . Smoking status: Current Every Day Smoker -- 0.50 packs/day for 54 years    Types: Cigarettes  . Smokeless tobacco: Never Used  . Alcohol Use: No     Review of Systems:  Review of systems was acquired mostly from the medical record, EMS, and little from the patient. 10 point review of systems was performed and was otherwise negative:  Constitutional: No fever Eyes: No visual disturbances ENT: No sore throat, ear pain Cardiac: No chest pain Respiratory: No shortness of breath, wheezing, or stridor Abdomen: No abdominal pain, no vomiting, No diarrhea Endocrine: No weight loss, No night sweats Extremities: No peripheral edema, cyanosis Skin: No rashes, easy bruising Neurologic: No focal weakness, trouble with speech or swollowing Urologic: No dysuria, Hematuria, or urinary frequency    Physical Exam:  ED Triage Vitals  Enc Vitals Group     BP 11/08/15 1023 81/53 mmHg     Pulse Rate 11/08/15 1023 86     Resp 11/08/15 1023 23     Temp 11/08/15 1024 98.7 F (37.1 C)     Temp Source 11/08/15 1024 Axillary     SpO2 11/08/15 1023 100 %     Weight 11/08/15 1024 271 lb 9.7 oz (123.2 kg)     Height 11/08/15 1024 5\' 6"  (1.676 m)     Head Cir --      Peak Flow --      Pain Score --      Pain Loc --      Pain Edu? --      Excl. in GC? --     General: Awake , Alert , and Oriented times 1 currently on CPAP. She does not have any upper respiratory retractions and is very somnolent. She will squeeze with her hands and we'll try to lift her legs up off the stretcher. Head: Normal cephalic , atraumatic Eyes: Pupils equal , round, reactive to light Nose/Throat: No nasal drainage, patent upper airway without erythema or exudate.  Neck: Supple, Full range of motion, No anterior adenopathy or palpable  thyroid masses Lungs: Diminished breath sounds bilaterally to bases Heart: Regular rate, regular rhythm without murmurs , gallops , or rubs Abdomen: Soft, non tender without rebound, guarding , or rigidity; bowel sounds positive and symmetric in all 4 quadrants. No organomegaly .        Extremities: 2 plus symmetric pulses. No edema, clubbing or cyanosis Neurologic: Patient grasp with both hands and will attempt to move her legs but little movement especially on the right lower extremity. Skin: warm, dry, no rashes   Labs:   All laboratory work was reviewed including any pertinent negatives or positives listed below:  Labs Reviewed  BRAIN NATRIURETIC PEPTIDE - Abnormal; Notable for the following:    B Natriuretic Peptide 147.0 (*)    All other components within normal limits  COMPREHENSIVE METABOLIC PANEL - Abnormal; Notable for the following:    Potassium 5.2 (*)    Chloride 97 (*)    Glucose, Bld 165 (*)    BUN 35 (*)    Creatinine, Ser 1.52 (*)    Calcium 8.4 (*)    Total Protein 8.2 (*)    Albumin 3.3 (*)    AST 14 (*)    GFR calc non Af Amer 33 (*)    GFR calc Af Amer 38 (*)    All other components within normal limits  CBC WITH DIFFERENTIAL/PLATELET - Abnormal; Notable for the following:    WBC 11.2 (*)  RBC 2.87 (*)    Hemoglobin 7.5 (*)    HCT 24.1 (*)    MCHC 31.0 (*)    RDW 17.0 (*)    Neutro Abs 9.2 (*)    Lymphs Abs 0.7 (*)    Monocytes Absolute 1.0 (*)    All other components within normal limits  FIBRIN DERIVATIVES D-DIMER ([facility] ONLY) - Abnormal; Notable for the following:    Fibrin derivatives D-dimer ([facility]) 1019 (*)    All other components within normal limits  BLOOD GAS, ARTERIAL - Abnormal; Notable for the following:    pCO2 arterial 62 (*)    pO2, Arterial 110 (*)    Bicarbonate 35.0 (*)    Acid-Base Excess 8.4 (*)    Allens test (pass/fail) POSITIVE (*)    All other components within normal limits  TSH - Abnormal; Notable for the following:     TSH 6.077 (*)    All other components within normal limits  TROPONIN I  TYPE AND SCREEN  PREPARE RBC (CROSSMATCH)  ABO/RH   laboratory work showed an initial blood gas did show a PCO2 of 62. She was also found to be anemic beyond her baseline. She has some mild renal insufficiency.  EKG:  ED ECG REPORT I, Jennye Moccasin, the attending physician, personally viewed and interpreted this ECG.  Date: 11/08/2015 EKG Time: 1022 Rate: 87 Rhythm: normal sinus rhythm QRS Axis: normal Intervals: normal ST/T Wave abnormalities: normal Conduction Disturbances: Prolonged QT interval Narrative Interpretation: unremarkable No acute ischemic changes noted   Radiology:    CT Angio Chest PE W/Cm &/Or Wo Cm (Final result) Result time: 11/08/15 14:10:15   Final result by Rad Results In Interface (11/08/15 14:10:15)   Narrative:   CLINICAL DATA: Shortness of breath this morning.  EXAM: CT ANGIOGRAPHY CHEST WITH CONTRAST  TECHNIQUE: Multidetector CT imaging of the chest was performed using the standard protocol during bolus administration of intravenous contrast. Multiplanar CT image reconstructions and MIPs were obtained to evaluate the vascular anatomy.  CONTRAST: 75mL OMNIPAQUE IOHEXOL 350 MG/ML SOLN  COMPARISON: January 09, 2015  FINDINGS: There is no pulmonary embolus. There is no mediastinal or hilar lymphadenopathy. Atherosclerosis of the thoracic aorta is identified without aneurysmal dilatation or dissection. The heart size is normal. There is no pericardial effusion.  Images of the lungs demonstrate mild atelectasis of the bilateral posterior lungs and medial right middle lobe. No focal pulmonary mass or pleural effusion is noted. There is no focal pneumonia. There is chronic elevation of right hemidiaphragm unchanged.  Degenerative joint changes of the spine are identified. The visualized upper abdominal structures are unremarkable. Previously noted upper pole left  kidney cysts is identified unchanged  Review of the MIP images confirms the above findings.  IMPRESSION: No pulmonary embolus.  Atelectasis of bilateral lung bases.   Electronically Signed By: Sherian Rein M.D. On: 11/08/2015 14:10          CT Head Wo Contrast (Final result) Result time: 11/08/15 14:05:08   Final result by Rad Results In Interface (11/08/15 14:05:08)   Narrative:   CLINICAL DATA: Shortness of breath, altered mental status  EXAM: CT HEAD WITHOUT CONTRAST  TECHNIQUE: Contiguous axial images were obtained from the base of the skull through the vertex without intravenous contrast.  COMPARISON: 06/08/2015  FINDINGS: No skull fracture is noted. Paranasal sinuses and mastoid air cells are unremarkable. No intracranial hemorrhage, mass effect or midline shift. Ventricular size is stable from prior exam. Stable cerebral atrophy. Chronic white matter  disease centrum semiovale bilaterally again noted. No acute cortical infarction. No mass lesion is noted on this unenhanced scan. Stable mild periventricular chronic white matter disease.  IMPRESSION: No acute intracranial abnormality. Stable atrophy and chronic white matter disease. No definite acute cortical infarction.   Electronically Signed By: Natasha Mead M.D. On: 11/08/2015 14:05          DG Chest Port 1 View (Final result) Result time: 11/08/15 11:16:25   Final result by Rad Results In Interface (11/08/15 11:16:25)   Narrative:   CLINICAL DATA: 75 year old female with shortness of breath since 05/1939 5 hours. Respiratory distress. Initial encounter.  EXAM: PORTABLE CHEST 1 VIEW  COMPARISON: 06/08/2015 and earlier.  FINDINGS: Portable AP upright view at 1053 hours. Large body habitus. Chronically low but lower lung volumes than prior. Stable cardiac size and mediastinal contours. No pneumothorax, pulmonary edema or consolidation.  IMPRESSION: Low lung volumes. No  acute cardiopulmonary abnormality identified.   Electronically Signed By: Odessa Fleming M.D. On: 11/08/2015 11:16      I personally reviewed the radiologic studies   P Critical Care:  CRITICAL CARE Performed by: Jennye Moccasin   Total critical care time: 65 minutes  Critical care time was exclusive of separately billable procedures and treating other patients.  Critical care was necessary to treat or prevent imminent or life-threatening deterioration.  Critical care was time spent personally by me on the following activities: development of treatment plan with patient and/or surrogate as well as nursing, discussions with consultants, evaluation of patient's response to treatment, examination of patient, obtaining history from patient or surrogate, ordering and performing treatments and interventions, ordering and review of laboratory studies, ordering and review of radiographic studies, pulse oximetry and re-evaluation of patient's condition. Critical care was for blood transfusion along with acute evaluation of respiratory distress and altered mental status    ED Course: The patient's stay here remained stable. She was found to have some hypotension on arrival but taking her blood pressure in the other extremities seem to show some normal levels. She received some IV fluids initially. We also tried Narcan for her altered mental status with really no effect. Patient had a head CT which shows no obvious intracranial abnormalities and a chest CT was performed mainly due to an elevated D-dimer test to evaluate for pulmonary embolism. This study also appears to be within normal limits and was also no identification of a thoracic dissection. The patient's differential for respiratory distress included congestive heart failure, pneumonia, pneumothorax, pulmonary embolism, etc. her studies concerning her fluid status and chest x-rays, etc. did not show indication of pulmonary edema at this  time. Patient was taken off of CPAP and placed on a 3 L nasal cannula and remained stable as it was no episodes of hypoxia. The patient had a rectal temperature with little stool in the rectal vault which made it difficult to guaiac her stool. She does not have obvious sepsis at this time though some of her laboratory work was pending during my disposition to the hospitalist team. Patient was identified to have anemia and was started on blood transfusion therapy.    Assessment: Respiratory distress Altered mental status Anemia     Plan: * Inpatient management I spoke to the hospitalist team, further disposition and management depends upon her evaluation.            Jennye Moccasin, MD 11/08/15 [phone]

## 2015-11-08 NOTE — ED Notes (Addendum)
Algis Liming, the patient's hospice nurse called for an update.  Her number is [phone]

## 2015-11-08 NOTE — ED Notes (Signed)
Patient transported to CT 

## 2015-11-08 NOTE — H&P (Addendum)
[facility] [place] at [facility]   PATIENT NAME: [name]    MR#:  [id number]  DATE OF BIRTH:  August 13, 1941  DATE OF ADMISSION:  11/08/2015  PRIMARY CARE PHYSICIAN: Stephanie Acre, MD   REQUESTING/REFERRING PHYSICIAN: Dr. Huel Cote.  CHIEF COMPLAINT:   Chief Complaint  Patient presents with  . Shortness of Breath    HISTORY OF PRESENT ILLNESS:  [name]  is a 75 y.o. female with a known history of COPD, chronic diastolic CHF, hypertension, diabetes and OSA. The patient was sent from nursing home to the ED due to the respiratory distress and confusion. The patient was put on BiPAP by EMS. PCO2 is 62, PO2 is 110. She was treated with Narcan result much improvement. Patient got a CT and GU of the chest which didn't show any PE. Chest x-ray didn't show any pulmonary edema or pneumonia. She was taken off BiPAP and put him oxygen by nasal cannular 3 L now. The patient has a history of altered mental status due to narcotics. She is taking oxycodone around-the-clock and when necessary in nursing home. Her hemoglobin decreased from 10 to 7.5, but could not get stool occult in ED. ED physician Dr. Huel Cote odered 2 unit blood transfusion.  PAST MEDICAL HISTORY:   Past Medical History  Diagnosis Date  . Hypothyroidism   . Depression   . Hypertensive heart disease   . Lumbar disc disease   . Cervical disc disease   . Paroxysmal atrial flutter (HCC)     a. s/p prior DCCV.  Marland Kitchen History of ETOH abuse     dependence with withdrawl seizures  . CAD (coronary artery disease)     a. 05/2011 NSTEMI/PCI: LM nl, LAD nl, LCX 45m (4.0x38 Promus DES), RCA 100 CTO.  Marland Kitchen Obstructive sleep apnea   . Neuropathy (HCC)   . Anemia   . GERD (gastroesophageal reflux disease)   . Diabetes mellitus without complication (HCC)   . Restless leg syndrome   . Unspecified psychosis   . Chronic diastolic CHF (congestive heart failure) (HCC)     a. 02/2012 Echo: EF 55-60%, Gr1 DD, mildly dil  LA, PASP .  Marland Kitchen COPD (chronic obstructive pulmonary disease) (HCC)   . PAF (paroxysmal atrial fibrillation) (HCC)     a. CHA2DS2VASc = 6-->amio/xarelto.  . Acute respiratory failure (HCC)     a. 06/2015 in setting of acute encephalopathy/Rx narcotic usage.  . Hyperlipidemia     PAST SURGICAL HISTORY:   Past Surgical History  Procedure Laterality Date  . Cervical disc surgery  03/2011    [facility]  . Thyroidectomy    . Tonsillectomy    . Replacement total knee      right knee  . Cardiac catheterization  05/2011    [facility] by Dr Mariah Milling  . Coronary angioplasty  05/2011    stent  . Cardioversion    . Colonoscopy      SOCIAL HISTORY:   Social History  Substance Use Topics  . Smoking status: Current Every Day Smoker -- 0.50 packs/day for 54 years    Types: Cigarettes  . Smokeless tobacco: Never Used  . Alcohol Use: No    FAMILY HISTORY:   Family History  Problem Relation Age of Onset  . Stroke Mother   . Cancer Father   . Hepatitis Brother     b    DRUG ALLERGIES:   Allergies  Allergen Reactions  . Lisinopril Other (See Comments)    Unknown  reaction  . Latex Itching and Rash    REVIEW OF SYSTEMS:  Unable to obtain due to AMS.  MEDICATIONS AT HOME:   Prior to Admission medications   Medication Sig Start Date End Date Taking? Authorizing Provider  acetaminophen (TYLENOL) 325 MG tablet Take 650 mg by mouth 3 (three) times daily.    Yes Historical Provider, MD  amiodarone (PACERONE) 200 MG tablet Take 200 mg by mouth daily.    Yes Historical Provider, MD  aspirin 81 MG chewable tablet Chew 81 mg by mouth daily.   Yes Historical Provider, MD  buPROPion (WELLBUTRIN XL) 150 MG 24 hr tablet Take 150 mg by mouth daily.   Yes Historical Provider, MD  Cholecalciferol (VITAMIN D3) 50000 UNITS CAPS Take 50,000 Units by mouth once a week. Take on Sundays.   Yes Historical Provider, MD  clobetasol cream (TEMOVATE) 0.05 % Apply 1 application topically 2 (two) times daily.  BID FOR RASH AND EVERY 12 HOURS PRN FOR DRY HANDS   Yes Historical Provider, MD  docusate sodium (COLACE) 100 MG capsule Take 100 mg by mouth every 12 (twelve) hours as needed for mild constipation or moderate constipation.    Yes Historical Provider, MD  DULoxetine (CYMBALTA) 60 MG capsule Take 60 mg by mouth daily.    Yes Historical Provider, MD  Fluticasone-Salmeterol (ADVAIR) 250-50 MCG/DOSE AEPB Inhale 1 puff into the lungs 2 (two) times daily.   Yes Historical Provider, MD  furosemide (LASIX) 40 MG tablet Take 40 mg by mouth daily.   Yes Historical Provider, MD  gabapentin (NEURONTIN) 100 MG capsule Take 100 mg by mouth 3 (three) times daily.    Yes Historical Provider, MD  guaifenesin (HUMIBID E) 400 MG TABS tablet Take 400 mg by mouth every 8 (eight) hours as needed (for cough.).   Yes Historical Provider, MD  hydrocerin (EUCERIN) CREA Apply 1 application topically at bedtime.   Yes Historical Provider, MD  ipratropium-albuterol (DUONEB) 0.5-2.5 (3) MG/3ML SOLN Take 3 mLs by nebulization every 6 (six) hours as needed (for congestion.).   Yes Historical Provider, MD  levothyroxine (SYNTHROID, LEVOTHROID) 150 MCG tablet Take 150 mcg by mouth daily.    Yes Historical Provider, MD  loperamide (IMODIUM A-D) 2 MG tablet Take 2 mg by mouth every 6 (six) hours as needed for diarrhea or loose stools.    Yes Historical Provider, MD  magnesium oxide (MAG-OX) 400 MG tablet Take 400 mg by mouth daily.   Yes Historical Provider, MD  metFORMIN (GLUCOPHAGE) 500 MG tablet Take 250 mg by mouth daily.    Yes Historical Provider, MD  montelukast (SINGULAIR) 10 MG tablet Take 10 mg by mouth daily.    Yes Historical Provider, MD  Omeprazole 20 MG TBEC Take 20 mg by mouth 2 (two) times daily.   Yes Historical Provider, MD  ondansetron (ZOFRAN-ODT) 4 MG disintegrating tablet Take 4 mg by mouth every 6 (six) hours as needed for nausea or vomiting.   Yes Historical Provider, MD  oxyCODONE (OXY IR/ROXICODONE) 5 MG  immediate release tablet Take 1 tablet (5 mg total) by mouth every 8 (eight) hours as needed (for moderate to severe pain.). Patient taking differently: Take 5 mg by mouth every 4 (four) hours as needed (for moderate to severe pain.).  06/10/15  Yes Alford Highland, MD  oxyCODONE (OXY IR/ROXICODONE) 5 MG immediate release tablet Take 5 mg by mouth 3 (three) times daily.   Yes Historical Provider, MD  POLYETHYLENE GLYCOL 3350 PO Take 17 g  by mouth at bedtime.   Yes Historical Provider, MD  polyvinyl alcohol (LIQUITEARS) 1.4 % ophthalmic solution Place 1 drop into both eyes every 6 (six) hours as needed for dry eyes.   Yes Historical Provider, MD  potassium chloride SA (K-DUR,KLOR-CON) 20 MEQ tablet Take 20 mEq by mouth daily.   Yes Historical Provider, MD  Rivaroxaban (XARELTO) 20 MG TABS tablet Take 20 mg by mouth daily.    Yes Historical Provider, MD  rOPINIRole (REQUIP) 2 MG tablet Take 2 mg by mouth at bedtime.   Yes Historical Provider, MD  Simethicone 80 MG TABS Take 80 mg by mouth every 6 (six) hours as needed (for flatulence.).   Yes Historical Provider, MD  simvastatin (ZOCOR) 10 MG tablet Take 10 mg by mouth at bedtime.    Yes Historical Provider, MD  Skin Protectants, Misc. (CALAZIME SKIN PROTECTANT) PSTE Apply topically.   Yes Historical Provider, MD  tiotropium (SPIRIVA) 18 MCG inhalation capsule Place 18 mcg into inhaler and inhale daily.   Yes Historical Provider, MD  vitamin C (ASCORBIC ACID) 500 MG tablet Take 500 mg by mouth 2 (two) times daily.    Yes Historical Provider, MD  Wound Dressings (MEDIHONEY WOUND/BURN DRESSING) GEL Apply 1 application topically every three (3) days as needed.   Yes Historical Provider, MD  ammonium lactate (LAC-HYDRIN) 12 % lotion Apply topically 2 (two) times daily. 05/31/15   Alford Highland, MD  ertapenem 1 g in sodium chloride 0.9 % 50 mL Inject 1 g into the vein daily. 05/31/15   Alford Highland, MD  Melatonin 5 MG TABS Take 5 mg by mouth at bedtime.     Historical Provider, MD  rOPINIRole (REQUIP) 1 MG tablet Take 2 mg by mouth at bedtime.     Historical Provider, MD      VITAL SIGNS:  Blood pressure 119/51, pulse 80, temperature 99.1 F (37.3 C), temperature source Oral, resp. rate 21, height  (1.676 m), weight 123.2 kg (271 lb 9.7 oz), SpO2 100 %.  PHYSICAL EXAMINATION:  GENERAL:  75 y.o.-year-old patient lying in the bed with no acute distress. Morbid obese.  EYES: Pupils equal, round, reactive to light and accommodation. No scleral icterus. Extraocular muscles intact.  HEENT: Head atraumatic, normocephalic.Dry oral mucosa.  NECK:  Supple, no jugular venous distention. No thyroid enlargement, no tenderness.  LUNGS: Normal breath sounds bilaterally, no wheezing, rales,rhonchi or crepitation. No use of accessory muscles of respiration.  CARDIOVASCULAR: S1, S2 normal. No murmurs, rubs, or gallops.  ABDOMEN: Soft, nontender, nondistended. Bowel sounds present. No organomegaly or mass.  EXTREMITIES: No pedal edema, cyanosis, or clubbing.  NEUROLOGIC: Unable to exam.  PSYCHIATRIC: The patient is unresponsive to verbal stimuli.   SKIN: No obvious rash, lesion, or ulcer.   LABORATORY PANEL:   CBC  Recent Labs Lab 11/08/15 1030  WBC 11.2*  HGB 7.5*  HCT 24.1*  PLT 429   ------------------------------------------------------------------------------------------------------------------  Chemistries   Recent Labs Lab 11/08/15 1030  NA 137  K 5.2*  CL 97*  CO2 31  GLUCOSE 165*  BUN 35*  CREATININE 1.52*  CALCIUM 8.4*  AST 14*  ALT 14  ALKPHOS 95  BILITOT 0.5   ------------------------------------------------------------------------------------------------------------------  Cardiac Enzymes  Recent Labs Lab 11/08/15 1030  TROPONINI 0.03   ------------------------------------------------------------------------------------------------------------------  RADIOLOGY:  Ct Head Wo Contrast  11/08/2015  CLINICAL  DATA:  Shortness of breath, altered mental status EXAM: CT HEAD WITHOUT CONTRAST TECHNIQUE: Contiguous axial images were obtained from the base of the  skull through the vertex without intravenous contrast. COMPARISON:  06/08/2015 FINDINGS: No skull fracture is noted. Paranasal sinuses and mastoid air cells are unremarkable. No intracranial hemorrhage, mass effect or midline shift. Ventricular size is stable from prior exam. Stable cerebral atrophy. 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 No acute cortical infarction. No mass lesion is noted on this unenhanced scan. Stable mild periventricular chronic white matter disease. IMPRESSION: No acute intracranial abnormality. Stable atrophy and chronic white matter disease. No definite acute cortical infarction. Electronically Signed   By: Natasha Mead M.D.   On: 11/08/2015 14:05   Ct Angio Chest Pe W/cm &/or Wo Cm  11/08/2015  CLINICAL DATA:  Shortness of breath this morning. EXAM: CT ANGIOGRAPHY CHEST WITH CONTRAST TECHNIQUE: Multidetector CT imaging of the chest was performed using the standard protocol during bolus administration of intravenous contrast. Multiplanar CT image reconstructions and MIPs were obtained to evaluate the vascular anatomy. CONTRAST:  75mL OMNIPAQUE IOHEXOL 350 MG/ML SOLN COMPARISON:  January 09, 2015 FINDINGS: There is no pulmonary embolus. There is no mediastinal or hilar lymphadenopathy. Atherosclerosis of the thoracic aorta is identified without aneurysmal dilatation or dissection. The heart size is normal. There is no pericardial effusion. Images of the lungs demonstrate mild atelectasis of the bilateral posterior lungs and medial right middle lobe. No focal pulmonary mass or pleural effusion is noted. There is no focal pneumonia. There is chronic elevation of right hemidiaphragm unchanged. Degenerative joint changes of the spine are identified. The visualized upper abdominal structures are unremarkable.  Previously noted upper pole left kidney cysts is identified unchanged Review of the MIP images confirms the above findings. IMPRESSION: No pulmonary embolus. Atelectasis of bilateral lung bases. Electronically Signed   By: Sherian Rein M.D.   On: 11/08/2015 14:10   Dg Chest Port 1 View  11/08/2015  CLINICAL DATA:  75 year old female with shortness of breath since 05/1939 5 hours. Respiratory distress. Initial encounter. EXAM: PORTABLE CHEST 1 VIEW COMPARISON:  06/08/2015 and earlier. FINDINGS: Portable AP upright view at 1053 hours. Large body habitus. Chronically low but lower lung volumes than prior. Stable cardiac size and mediastinal contours. No pneumothorax, pulmonary edema or consolidation. IMPRESSION: Low lung volumes.  No acute cardiopulmonary abnormality identified. Electronically Signed   By: Odessa Fleming M.D.   On: 11/08/2015 11:16    EKG:   Orders placed or performed during the hospital encounter of 11/08/15  . EKG 12-Lead  . EKG 12-Lead  . ED EKG  . ED EKG    IMPRESSION AND PLAN:   Acute respiratory failure with hypercapnia Acute metabolic encephalopathy, possible due to narcotics The patient will be admitted to medical floor, continue oxygen by nasal cannular, DuoNeb when necessary, continue Spiriva and Singulair.   Acute renal failure with dehydration. Hold Lasix and give gentle rehydration.   Hyperkalemia, give gentle IV fluid support and follow-up BMP.   Anemia. Hold xarelto, follow-up PRBC transfusion and hemoglobin in the morning. FOBT.  Chronic Afib. Hold xarelto.  COPD. stable, continue patient home nebulizer treatments.  Chronic diastolic CHF, stable, hold Lasix. Hypertension. Continue home hypertension medication.  Diabetes. Hold metformin, start sliding scale.  OSA. CPAP at night.  Morbid obesity    All the records are reviewed and case discussed with ED provider. Management plans discussed with the patient's daughter and they are in agreement.  CODE  STATUS: DO NOT RESUSCITATE   TOTAL TIME TAKING CARE OF THIS PATIENT: 58 minutes.    Shaune Pollack M.D on 11/08/2015  at 3:16 PM  Between 7am to 6pm - Pager - [phone]  After 6pm go to [email or web address] - password [email or web address]  [facility]  Office  [phone]  CC: Primary care physician; Stephanie Acre, MD

## 2015-11-09 DIAGNOSIS — D509 Iron deficiency anemia, unspecified: Secondary | ICD-10-CM

## 2015-11-09 LAB — TYPE AND SCREEN
ABO/RH(D): O POS
ANTIBODY SCREEN: NEGATIVE
Unit division: 0
Unit division: 0

## 2015-11-09 LAB — CBC
HCT: 26.1 % — ABNORMAL LOW (ref 35.0–47.0)
Hemoglobin: 8.3 g/dL — ABNORMAL LOW (ref 12.0–16.0)
MCH: 27.4 pg (ref 26.0–34.0)
MCHC: 31.8 g/dL — ABNORMAL LOW (ref 32.0–36.0)
MCV: 86.2 fL (ref 80.0–100.0)
PLATELETS: 359 10*3/uL (ref 150–440)
RBC: 3.03 MIL/uL — ABNORMAL LOW (ref 3.80–5.20)
RDW: 16.6 % — AB (ref 11.5–14.5)
WBC: 10.6 10*3/uL (ref 3.6–11.0)

## 2015-11-09 LAB — GLUCOSE, CAPILLARY
GLUCOSE-CAPILLARY: 143 mg/dL — AB (ref 65–99)
GLUCOSE-CAPILLARY: 128 mg/dL — AB (ref 65–99)
GLUCOSE-CAPILLARY: 119 mg/dL — AB (ref 65–99)
Glucose-Capillary: 158 mg/dL — ABNORMAL HIGH (ref 65–99)

## 2015-11-09 LAB — BASIC METABOLIC PANEL
Anion gap: 7 (ref 5–15)
BUN: 24 mg/dL — AB (ref 6–20)
CHLORIDE: 102 mmol/L (ref 101–111)
CO2: 29 mmol/L (ref 22–32)
CREATININE: 0.97 mg/dL (ref 0.44–1.00)
Calcium: 8.5 mg/dL — ABNORMAL LOW (ref 8.9–10.3)
GFR calc Af Amer: >60 mL/min (ref >60)
GFR calc non Af Amer: 57 mL/min — ABNORMAL LOW (ref >60)
Glucose, Bld: 123 mg/dL — ABNORMAL HIGH (ref 65–99)
Potassium: 4.4 mmol/L (ref 3.5–5.1)
SODIUM: 138 mmol/L (ref 135–145)

## 2015-11-09 MED ORDER — LEVOFLOXACIN 500 MG PO TABS
500.0000 mg | ORAL_TABLET | Freq: Every day | ORAL | Status: AC
  Administered 2015-11-09 – 2015-11-13 (×5): 500 mg via ORAL
  Filled 2015-11-09 (×5): qty 1

## 2015-11-09 MED ORDER — METHYLPREDNISOLONE SODIUM SUCC 125 MG IJ SOLR
60.0000 mg | Freq: Four times a day (QID) | INTRAMUSCULAR | Status: DC
  Administered 2015-11-09 – 2015-11-11 (×8): 60 mg via INTRAVENOUS
  Filled 2015-11-09 (×8): qty 2

## 2015-11-09 MED ORDER — PANTOPRAZOLE SODIUM 40 MG IV SOLR
40.0000 mg | Freq: Two times a day (BID) | INTRAVENOUS | Status: DC
  Administered 2015-11-09 – 2015-11-12 (×6): 40 mg via INTRAVENOUS
  Filled 2015-11-09 (×6): qty 40

## 2015-11-09 MED ORDER — METOPROLOL TARTRATE 25 MG PO TABS
25.0000 mg | ORAL_TABLET | Freq: Two times a day (BID) | ORAL | Status: DC
  Administered 2015-11-09 – 2015-11-12 (×6): 25 mg via ORAL
  Filled 2015-11-09 (×7): qty 1

## 2015-11-09 NOTE — Care Management (Signed)
Admitted to [facility] with the dialysis of acute/chronic respiratory distress. [facility] x 6 years per Ms. [place] Lodge. Sees Dr. Dan Humphreys at [facility] as primary care physician. Daughter is Gavin Pound [phone] or [phone]). Night oxygen only at 2.5 liters per nasal cannula. States she gets around per wheelchair at the facility. "O.K. Appetite."  States she slipped off the bed last week, not actually a fall.  Foley intact. Gwenette Greet RN MSN CCM Care Management [phone]

## 2015-11-09 NOTE — Progress Notes (Signed)
[facility] [place] [facility]                                                                                                                                                                                            Patient Demographics   [name], is a 75 y.o. female, DOB - 1941/06/25, ZOX:[id number]  Admit date - 11/08/2015   Admitting Physician Shaune Pollack, MD  Outpatient Primary MD for the patient is Stephanie Acre, MD   LOS - 1  Subjective: Patient admitted with acute respiratory failure requiring BiPAP. Also noticed to be anemic. Patient was transfused 2 units of packed RBCs. She reports that she's been feeling better still short of breath.     Review of Systems:   CONSTITUTIONAL: No documented fever. No fatigue, weakness. No weight gain, no weight loss.  EYES: No blurry or double vision.  ENT: No tinnitus. No postnasal drip. No redness of the oropharynx.  RESPIRATORY: Positive cough, no wheeze, no hemoptysis. Positive dyspnea.  CARDIOVASCULAR: No chest pain. No orthopnea. No palpitations. No syncope.  GASTROINTESTINAL: No nausea, no vomiting or diarrhea. No abdominal pain. No melena or hematochezia.  GENITOURINARY: No dysuria or hematuria.  ENDOCRINE: No polyuria or nocturia. No heat or cold intolerance.  HEMATOLOGY: No anemia. No bruising. No bleeding.  INTEGUMENTARY: No rashes. No lesions.  MUSCULOSKELETAL: No arthritis. No swelling. No gout. Complains of pain in the left shoulder with movement NEUROLOGIC: No numbness, tingling, or ataxia. No seizure-type activity.  PSYCHIATRIC: No anxiety. No insomnia. No ADD.    Vitals:   Filed Vitals:   11/08/15 2307 11/09/15 0140 11/09/15 0541 11/09/15 1110  BP: 146/91 108/51 159/87   Pulse: 113 73 81   Temp: 99.5 F (37.5 C) 99.1 F (37.3 C) 98.4 F (36.9 C)   TempSrc: Oral Oral Oral   Resp: 20 20 18    Height:      Weight:      SpO2: 95% 94% 98% 99%    Wt Readings from Last 3 Encounters:   11/08/15 123.288 kg (271 lb 12.8 oz)  06/08/15 112.719 kg (248 lb 8 oz)  05/28/15 119.16 kg (262 lb 11.2 oz)     Intake/Output Summary (Last 24 hours) at 11/09/15 1355 Last data filed at 11/09/15 0800  Gross per 24 hour  Intake   1260 ml  Output   1000 ml  Net    260 ml    Physical Exam:   GENERAL: Pleasant-appearing in no apparent distress.  HEAD, EYES, EARS, NOSE AND THROAT: Atraumatic, normocephalic. Extraocular muscles are intact. Pupils equal and reactive to light. Sclerae anicteric.  No conjunctival injection. No oro-pharyngeal erythema.  NECK: Supple. There is no jugular venous distention. No bruits, no lymphadenopathy, no thyromegaly.  HEART: Regular rate and rhythm,. No murmurs, no rubs, no clicks.  LUNGS: Occasional wheezing no crackles.  ABDOMEN: Soft, flat, nontender, nondistended. Has good bowel sounds. No hepatosplenomegaly appreciated.  EXTREMITIES: No evidence of any cyanosis, clubbing, or peripheral edema.  +2 pedal and radial pulses bilaterally.  NEUROLOGIC: The patient is alert, awake, and oriented x3 with no focal motor or sensory deficits appreciated bilaterally.  SKIN: Moist and warm with no rashes appreciated.  Psych: Not anxious, depressed LN: No inguinal LN enlargement    Antibiotics   Anti-infectives    Start     Dose/Rate Route Frequency Ordered Stop   11/09/15 1400  levofloxacin (LEVAQUIN) tablet 500 mg     500 mg Oral Daily 11/09/15 1353        Medications   Scheduled Meds: . amiodarone  200 mg Oral Daily  . antiseptic oral rinse  7 mL Mouth Rinse BID  . aspirin  81 mg Oral Daily  . buPROPion  150 mg Oral Daily  . DULoxetine  60 mg Oral Daily  . gabapentin  100 mg Oral TID  . insulin aspart  0-5 Units Subcutaneous QHS  . insulin aspart  0-9 Units Subcutaneous TID WC  . levofloxacin  500 mg Oral Daily  . levothyroxine  150 mcg Oral QAC breakfast  . magnesium oxide  400 mg Oral Daily  . methylPREDNISolone (SOLU-MEDROL) injection  60 mg  Intravenous Q6H  . mometasone-formoterol  2 puff Inhalation BID  . montelukast  10 mg Oral Daily  . pantoprazole  40 mg Oral Daily  . rOPINIRole  2 mg Oral QHS  . simvastatin  10 mg Oral QHS  . sodium chloride flush  3 mL Intravenous Q12H  . tiotropium  18 mcg Inhalation Daily   Continuous Infusions: . sodium chloride 75 mL/hr at 11/09/15 0912   PRN Meds:.sodium chloride, acetaminophen **OR** acetaminophen, albuterol, docusate sodium, guaiFENesin, ipratropium-albuterol, loperamide, ondansetron **OR** ondansetron (ZOFRAN) IV, oxyCODONE, polyvinyl alcohol, simethicone, sodium chloride flush, zolpidem   Data Review:   Micro Results Recent Results (from the past 240 hour(s))  MRSA PCR Screening     Status: None   Collection Time: 11/08/15 10:10 PM  Result Value Ref Range Status   MRSA by PCR NEGATIVE NEGATIVE Final    Comment:        The GeneXpert MRSA Assay (FDA approved for NASAL specimens only), is one component of a comprehensive MRSA colonization surveillance program. It is not intended to diagnose MRSA infection nor to guide or monitor treatment for MRSA infections.     Radiology Reports Ct Head Wo Contrast  11/08/2015  CLINICAL DATA:  Shortness of breath, altered mental status EXAM: CT HEAD WITHOUT CONTRAST TECHNIQUE: Contiguous axial images were obtained from the base of the skull through the vertex without intravenous contrast. COMPARISON:  06/08/2015 FINDINGS: No skull fracture is noted. Paranasal sinuses and mastoid air cells are unremarkable. No intracranial hemorrhage, mass effect or midline shift. Ventricular size is stable from prior exam. Stable cerebral atrophy. 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 No acute cortical infarction. No mass lesion is noted on this unenhanced scan. Stable mild periventricular chronic white matter disease. IMPRESSION: No acute intracranial abnormality. Stable atrophy and chronic white matter disease. No  definite acute cortical infarction. Electronically Signed   By: Natasha Mead M.D.   On: 11/08/2015 14:05  Ct Angio Chest Pe W/cm &/or Wo Cm  11/08/2015  CLINICAL DATA:  Shortness of breath this morning. EXAM: CT ANGIOGRAPHY CHEST WITH CONTRAST TECHNIQUE: Multidetector CT imaging of the chest was performed using the standard protocol during bolus administration of intravenous contrast. Multiplanar CT image reconstructions and MIPs were obtained to evaluate the vascular anatomy. CONTRAST:  75mL OMNIPAQUE IOHEXOL 350 MG/ML SOLN COMPARISON:  January 09, 2015 FINDINGS: There is no pulmonary embolus. There is no mediastinal or hilar lymphadenopathy. Atherosclerosis of the thoracic aorta is identified without aneurysmal dilatation or dissection. The heart size is normal. There is no pericardial effusion. Images of the lungs demonstrate mild atelectasis of the bilateral posterior lungs and medial right middle lobe. No focal pulmonary mass or pleural effusion is noted. There is no focal pneumonia. There is chronic elevation of right hemidiaphragm unchanged. Degenerative joint changes of the spine are identified. The visualized upper abdominal structures are unremarkable. Previously noted upper pole left kidney cysts is identified unchanged Review of the MIP images confirms the above findings. IMPRESSION: No pulmonary embolus. Atelectasis of bilateral lung bases. Electronically Signed   By: Sherian Rein M.D.   On: 11/08/2015 14:10   Dg Chest Port 1 View  11/08/2015  CLINICAL DATA:  75 year old female with shortness of breath since 05/1939 5 hours. Respiratory distress. Initial encounter. EXAM: PORTABLE CHEST 1 VIEW COMPARISON:  06/08/2015 and earlier. FINDINGS: Portable AP upright view at 1053 hours. Large body habitus. Chronically low but lower lung volumes than prior. Stable cardiac size and mediastinal contours. No pneumothorax, pulmonary edema or consolidation. IMPRESSION: Low lung volumes.  No acute cardiopulmonary  abnormality identified. Electronically Signed   By: Odessa Fleming M.D.   On: 11/08/2015 11:16     CBC  Recent Labs Lab 11/08/15 1030 11/09/15 0626  WBC 11.2* 10.6  HGB 7.5* 8.3*  HCT 24.1* 26.1*  PLT 429 359  MCV 84.2 86.2  MCH 26.1 27.4  MCHC 31.0* 31.8*  RDW 17.0* 16.6*  LYMPHSABS 0.7*  --   MONOABS 1.0*  --   EOSABS 0.1  --   BASOSABS 0.1  --     Chemistries   Recent Labs Lab 11/08/15 1030 11/09/15 0626  NA 137 138  K 5.2* 4.4  CL 97* 102  CO2 31 29  GLUCOSE 165* 123*  BUN 35* 24*  CREATININE 1.52* 0.97  CALCIUM 8.4* 8.5*  AST 14*  --   ALT 14  --   ALKPHOS 95  --   BILITOT 0.5  --    ------------------------------------------------------------------------------------------------------------------ estimated creatinine clearance is 69.2 mL/min (by C-G formula based on Cr of 0.97). ------------------------------------------------------------------------------------------------------------------ No results for input(s): HGBA1C in the last 72 hours. ------------------------------------------------------------------------------------------------------------------ No results for input(s): CHOL, HDL, LDLCALC, TRIG, CHOLHDL, LDLDIRECT in the last 72 hours. ------------------------------------------------------------------------------------------------------------------  Recent Labs  11/08/15 1030  TSH 6.077*   ------------------------------------------------------------------------------------------------------------------ No results for input(s): VITAMINB12, FOLATE, FERRITIN, TIBC, IRON, RETICCTPCT in the last 72 hours.  Coagulation profile No results for input(s): INR, PROTIME in the last 168 hours.  No results for input(s): DDIMER in the last 72 hours.  Cardiac Enzymes  Recent Labs Lab 11/08/15 1030  TROPONINI 0.03   ------------------------------------------------------------------------------------------------------------------ Invalid input(s):  POCBNP    Assessment & Plan  Patient is a 75 year old white female nursing home resident  #1 Acute respiratory failure with hypercapnia suspect due to acute on chronic COPD exasperation as well as respiratory suppressions from narcotics. At this point I will continue her nebulizers for bronchitis I will add oral  Levaquin. As well as Solu-Medrol. #2 Acute metabolic encephalopathy, possible due to narcotics Due to her narcotic medications not improved  #3 Acute renal failure with dehydration. Improved with IV fluids  #4 Hyperkalemia, resolved with IV fluids  #5 Anemia. Possible GI blood loss seen by GI will eventually need EGD placed on Protonix twice a day hold Xarelto  #6 Chronic Afib. Hold xarelto. Heart rate stable continue amiodarone  #7 Chronic diastolic CHF, stable, hold Lasix . #8 Hypertension.  I will add metoprolol to her regimen  #9 Diabetes. Hold metformin, continue sliding scale.   #10 OSA. CPAP at night.        Code Status Orders        Start     Ordered   11/08/15 1803  Do not attempt resuscitation (DNR)   Continuous    Question Answer Comment  In the event of cardiac or respiratory ARREST Do not call a "code blue"   In the event of cardiac or respiratory ARREST Do not perform Intubation, CPR, defibrillation or ACLS   In the event of cardiac or respiratory ARREST Use medication by any route, position, wound care, and other measures to relive pain and suffering. May use oxygen, suction and manual treatment of airway obstruction as needed for comfort.      11/08/15 1802    Code Status History    Date Active Date Inactive Code Status Order ID Comments User Context   06/08/2015  9:40 AM 06/10/2015  2:44 PM Full Code [id number]  Alford Highland, MD ED   05/27/2015  7:19 PM 05/31/2015  4:04 PM Full Code [id number]  Enid Baas, MD Inpatient    Advance Directive Documentation        Most Recent Value   Type of Advance Directive  Healthcare Power of Attorney    Pre-existing out of facility DNR order (yellow form or pink MOST form)     "MOST" Form in Place?             Consults  gi  DVT Prophylaxis  scd's   Lab Results  Component Value Date   PLT 359 11/09/2015     Time Spent in minutes   Auburn Bilberry M.D on 11/09/2015 at 1:55 PM  Between 7am to 6pm - Pager - [phone]  After 6pm go to [email or web address] - password [email or web address] [facility]  [facility]   Office  [phone]

## 2015-11-09 NOTE — Consult Note (Addendum)
[facility]  [address] [place], [place] [address] Phone: [phone] Fax : [phone]  Consultation  Referring Provider:     No ref. provider found Primary Care Physician:  Stephanie Acre, MD Primary Gastroenterologist:  Dr. Bluford Kaufmann         Reason for Consultation:     Anemia  Date of Admission:  11/08/2015 Date of Consultation:  11/09/2015         HPI:   [name] is a 75 y.o. female who was admitted with shortness of breath and a history of COPD. The patient was found to have anemia with a Hb of  7.5 and was transfused with a Hb of 8.3 today. Her past Hb had been around 11. The patient denies an abd pain, nausea, vomiting, black stools or bloody stools.The patient reports that she had a colonoscopy 2 years ago by Dr. Bluford Kaufmann and reports that it was normal. The patient does report th small amount of food and the go very full quickly. She denies any unexplained abdominal pain and bloating or previous GI bleeds. The patient states that she has been living in a nursing home and is on palliative care with hospice but states it is only because she needs higher acuity and is not actually on hospice.   Past Medical History  Diagnosis Date  . Hypothyroidism   . Depression   . Hypertensive heart disease   . Lumbar disc disease   . Cervical disc disease   . Paroxysmal atrial flutter (HCC)     a. s/p prior DCCV.  Marland Kitchen History of ETOH abuse     dependence with withdrawl seizures  . CAD (coronary artery disease)     a. 05/2011 NSTEMI/PCI: LM nl, LAD nl, LCX 70m (4.0x38 Promus DES), RCA 100 CTO.  Marland Kitchen Obstructive sleep apnea   . Neuropathy (HCC)   . Anemia   . GERD (gastroesophageal reflux disease)   . Diabetes mellitus without complication (HCC)   . Restless leg syndrome   . Unspecified psychosis   . Chronic diastolic CHF (congestive heart failure) (HCC)     a. 02/2012 Echo: EF 55-60%, Gr1 DD, mildly dil LA, PASP .  Marland Kitchen COPD (chronic obstructive pulmonary disease) (HCC)     . PAF (paroxysmal atrial fibrillation) (HCC)     a. CHA2DS2VASc = 6-->amio/xarelto.  . Acute respiratory failure (HCC)     a. 06/2015 in setting of acute encephalopathy/Rx narcotic usage.  . Hyperlipidemia     Past Surgical History  Procedure Laterality Date  . Cervical disc surgery  03/2011    [facility]  . Thyroidectomy    . Tonsillectomy    . Replacement total knee      right knee  . Cardiac catheterization  05/2011    [facility] by Dr Mariah Milling  . Coronary angioplasty  05/2011    stent  . Cardioversion    . Colonoscopy      Prior to Admission medications   Medication Sig Start Date End Date Taking? Authorizing Provider  acetaminophen (TYLENOL) 325 MG tablet Take 650 mg by mouth 3 (three) times daily.    Yes Historical Provider, MD  amiodarone (PACERONE) 200 MG tablet Take 200 mg by mouth daily.    Yes Historical Provider, MD  aspirin 81 MG chewable tablet Chew 81 mg by mouth daily.   Yes Historical Provider, MD  buPROPion (WELLBUTRIN XL) 150 MG 24 hr tablet Take 150 mg by mouth daily.   Yes Historical Provider, MD  Cholecalciferol (VITAMIN D3) 50000 UNITS CAPS Take 50,000 Units by mouth once a week. Take on Sundays.   Yes Historical Provider, MD  clobetasol cream (TEMOVATE) 0.05 % Apply 1 application topically 2 (two) times daily. BID FOR RASH AND EVERY 12 HOURS PRN FOR DRY HANDS   Yes Historical Provider, MD  docusate sodium (COLACE) 100 MG capsule Take 100 mg by mouth every 12 (twelve) hours as needed for mild constipation or moderate constipation.    Yes Historical Provider, MD  DULoxetine (CYMBALTA) 60 MG capsule Take 60 mg by mouth daily.    Yes Historical Provider, MD  Fluticasone-Salmeterol (ADVAIR) 250-50 MCG/DOSE AEPB Inhale 1 puff into the lungs 2 (two) times daily.   Yes Historical Provider, MD  furosemide (LASIX) 40 MG tablet Take 40 mg by mouth daily.   Yes Historical Provider, MD  gabapentin (NEURONTIN) 100 MG capsule Take 100 mg by mouth 3 (three) times daily.    Yes  Historical Provider, MD  guaifenesin (HUMIBID E) 400 MG TABS tablet Take 400 mg by mouth every 8 (eight) hours as needed (for cough.).   Yes Historical Provider, MD  hydrocerin (EUCERIN) CREA Apply 1 application topically at bedtime.   Yes Historical Provider, MD  ipratropium-albuterol (DUONEB) 0.5-2.5 (3) MG/3ML SOLN Take 3 mLs by nebulization every 6 (six) hours as needed (for congestion.).   Yes Historical Provider, MD  levothyroxine (SYNTHROID, LEVOTHROID) 150 MCG tablet Take 150 mcg by mouth daily.    Yes Historical Provider, MD  loperamide (IMODIUM A-D) 2 MG tablet Take 2 mg by mouth every 6 (six) hours as needed for diarrhea or loose stools.    Yes Historical Provider, MD  magnesium oxide (MAG-OX) 400 MG tablet Take 400 mg by mouth daily.   Yes Historical Provider, MD  metFORMIN (GLUCOPHAGE) 500 MG tablet Take 250 mg by mouth daily.    Yes Historical Provider, MD  montelukast (SINGULAIR) 10 MG tablet Take 10 mg by mouth daily.    Yes Historical Provider, MD  Omeprazole 20 MG TBEC Take 20 mg by mouth 2 (two) times daily.   Yes Historical Provider, MD  ondansetron (ZOFRAN-ODT) 4 MG disintegrating tablet Take 4 mg by mouth every 6 (six) hours as needed for nausea or vomiting.   Yes Historical Provider, MD  oxyCODONE (OXY IR/ROXICODONE) 5 MG immediate release tablet Take 1 tablet (5 mg total) by mouth every 8 (eight) hours as needed (for moderate to severe pain.). Patient taking differently: Take 5 mg by mouth every 4 (four) hours as needed (for moderate to severe pain.).  06/10/15  Yes Alford Highland, MD  oxyCODONE (OXY IR/ROXICODONE) 5 MG immediate release tablet Take 5 mg by mouth 3 (three) times daily.   Yes Historical Provider, MD  POLYETHYLENE GLYCOL 3350 PO Take 17 g by mouth at bedtime.   Yes Historical Provider, MD  polyvinyl alcohol (LIQUITEARS) 1.4 % ophthalmic solution Place 1 drop into both eyes every 6 (six) hours as needed for dry eyes.   Yes Historical Provider, MD  potassium  chloride SA (K-DUR,KLOR-CON) 20 MEQ tablet Take 20 mEq by mouth daily.   Yes Historical Provider, MD  Rivaroxaban (XARELTO) 20 MG TABS tablet Take 20 mg by mouth daily.    Yes Historical Provider, MD  rOPINIRole (REQUIP) 2 MG tablet Take 2 mg by mouth at bedtime.   Yes Historical Provider, MD  Simethicone 80 MG TABS Take 80 mg by mouth every 6 (six) hours as needed (for flatulence.).   Yes Historical Provider, MD  simvastatin (ZOCOR) 10 MG tablet Take 10 mg by mouth at bedtime.    Yes Historical Provider, MD  Skin Protectants, Misc. (CALAZIME SKIN PROTECTANT) PSTE Apply topically.   Yes Historical Provider, MD  tiotropium (SPIRIVA) 18 MCG inhalation capsule Place 18 mcg into inhaler and inhale daily.   Yes Historical Provider, MD  vitamin C (ASCORBIC ACID) 500 MG tablet Take 500 mg by mouth 2 (two) times daily.    Yes Historical Provider, MD  Wound Dressings (MEDIHONEY WOUND/BURN DRESSING) GEL Apply 1 application topically every three (3) days as needed.   Yes Historical Provider, MD  ammonium lactate (LAC-HYDRIN) 12 % lotion Apply topically 2 (two) times daily. 05/31/15   Alford Highland, MD  ertapenem 1 g in sodium chloride 0.9 % 50 mL Inject 1 g into the vein daily. 05/31/15   Alford Highland, MD  Melatonin 5 MG TABS Take 5 mg by mouth at bedtime.    Historical Provider, MD  rOPINIRole (REQUIP) 1 MG tablet Take 2 mg by mouth at bedtime.     Historical Provider, MD    Family History  Problem Relation Age of Onset  . Stroke Mother   . Cancer Father   . Hepatitis Brother     b     Social History  Substance Use Topics  . Smoking status: Current Every Day Smoker -- 0.50 packs/day for 54 years    Types: Cigarettes  . Smokeless tobacco: Never Used  . Alcohol Use: No    Allergies as of 11/08/2015 - Review Complete 11/08/2015  Allergen Reaction Noted  . Lisinopril Other (See Comments) 05/27/2015  . Latex Itching and Rash 10/24/2013    Review of Systems:    All systems reviewed and  negative except where noted in HPI.   Physical Exam:  Vital signs in last 24 hours: Temp:  [98.4 F (36.9 C)-100 F (37.8 C)] 98.4 F (36.9 C) (02/03 0541) Pulse Rate:  [73-119] 81 (02/03 0541) Resp:  [17-24] 18 (02/03 0541) BP: (108-160)/(46-91) 159/87 mmHg (02/03 0541) SpO2:  [91 %-100 %] 99 % (02/03 1110) Weight:  [271 lb 12.8 oz (123.288 kg)] 271 lb 12.8 oz (123.288 kg) (02/02 1758)   General:   Pleasant, cooperative in NAD Head:  Normocephalic and atraumatic. Eyes:   No icterus.   Conjunctiva pink. PERRLA. Ears:  Normal auditory acuity. Neck:  Supple; no masses or thyroidomegaly Lungs: Poor air movement with crackles and rhonchi.  Heart:  Regular rate and rhythm;  Without murmur, clicks, rubs or gallops Abdomen:  Soft, obese, nontender. Normal bowel sounds. No appreciable masses or hepatomegaly.  No rebound or guarding.  Rectal:  Not performed. Msk:  Symmetrical without gross deformities.    Extremities:  Without edema, cyanosis or clubbing. Neurologic:  Alert and oriented x3;  grossly normal neurologically. Skin:  Intact without significant lesions or rashes. Cervical Nodes:  No significant cervical adenopathy. Psych:  Alert and cooperative. Normal affect.  LAB RESULTS:  Recent Labs  11/08/15 1030 11/09/15 0626  WBC 11.2* 10.6  HGB 7.5* 8.3*  HCT 24.1* 26.1*  PLT 429 359   BMET  Recent Labs  11/08/15 1030 11/09/15 0626  NA 137 138  K 5.2* 4.4  CL 97* 102  CO2 31 29  GLUCOSE 165* 123*  BUN 35* 24*  CREATININE 1.52* 0.97  CALCIUM 8.4* 8.5*   LFT  Recent Labs  11/08/15 1030  PROT 8.2*  ALBUMIN 3.3*  AST 14*  ALT 14  ALKPHOS 95  BILITOT 0.5  PT/INR No results for input(s): LABPROT, INR in the last 72 hours.  STUDIES: Ct Head Wo Contrast  11/08/2015  CLINICAL DATA:  Shortness of breath, altered mental status EXAM: CT HEAD WITHOUT CONTRAST TECHNIQUE: Contiguous axial images were obtained from the base of the skull through the vertex without  intravenous contrast. COMPARISON:  06/08/2015 FINDINGS: No skull fracture is noted. Paranasal sinuses and mastoid air cells are unremarkable. No intracranial hemorrhage, mass effect or midline shift. Ventricular size is stable from prior exam. Stable cerebral atrophy. 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 No acute cortical infarction. No mass lesion is noted on this unenhanced scan. Stable mild periventricular chronic white matter disease. IMPRESSION: No acute intracranial abnormality. Stable atrophy and chronic white matter disease. No definite acute cortical infarction. Electronically Signed   By: Natasha Mead M.D.   On: 11/08/2015 14:05   Ct Angio Chest Pe W/cm &/or Wo Cm  11/08/2015  CLINICAL DATA:  Shortness of breath this morning. EXAM: CT ANGIOGRAPHY CHEST WITH CONTRAST TECHNIQUE: Multidetector CT imaging of the chest was performed using the standard protocol during bolus administration of intravenous contrast. Multiplanar CT image reconstructions and MIPs were obtained to evaluate the vascular anatomy. CONTRAST:  75mL OMNIPAQUE IOHEXOL 350 MG/ML SOLN COMPARISON:  January 09, 2015 FINDINGS: There is no pulmonary embolus. There is no mediastinal or hilar lymphadenopathy. Atherosclerosis of the thoracic aorta is identified without aneurysmal dilatation or dissection. The heart size is normal. There is no pericardial effusion. Images of the lungs demonstrate mild atelectasis of the bilateral posterior lungs and medial right middle lobe. No focal pulmonary mass or pleural effusion is noted. There is no focal pneumonia. There is chronic elevation of right hemidiaphragm unchanged. Degenerative joint changes of the spine are identified. The visualized upper abdominal structures are unremarkable. Previously noted upper pole left kidney cysts is identified unchanged Review of the MIP images confirms the above findings. IMPRESSION: No pulmonary embolus. Atelectasis of bilateral lung  bases. Electronically Signed   By: Sherian Rein M.D.   On: 11/08/2015 14:10   Dg Chest Port 1 View  11/08/2015  CLINICAL DATA:  75 year old female with shortness of breath since 05/1939 5 hours. Respiratory distress. Initial encounter. EXAM: PORTABLE CHEST 1 VIEW COMPARISON:  06/08/2015 and earlier. FINDINGS: Portable AP upright view at 1053 hours. Large body habitus. Chronically low but lower lung volumes than prior. Stable cardiac size and mediastinal contours. No pneumothorax, pulmonary edema or consolidation. IMPRESSION: Low lung volumes.  No acute cardiopulmonary abnormality identified. Electronically Signed   By: Odessa Fleming M.D.   On: 11/08/2015 11:16      Impression / Plan:   [name] is a 75 y.o. y/o female with COPD, shortness of breath and found to have anemia. The patient has not had any stools back that show her to have a GI bleed at this time. She does report feeling full fast after eating. The patient will likely need an EGD and may need a repeat colonoscopy. At this time I would wait until she is more stable and breathing better. Will plan for early in the week or sooner if she has any acute GI bleeding.   Thank you for involving me in the care of this patient.      LOS: 1 day   Darlina Rumpf, MD  11/09/2015, 1:26 PM   Note: This dictation was prepared with Dragon dictation along with smaller phrase technology. Any transcriptional errors that result from this process are unintentional.

## 2015-11-10 DIAGNOSIS — D508 Other iron deficiency anemias: Secondary | ICD-10-CM

## 2015-11-10 DIAGNOSIS — R6881 Early satiety: Secondary | ICD-10-CM | POA: Insufficient documentation

## 2015-11-10 LAB — CBC
HEMATOCRIT: 28.5 % — AB (ref 35.0–47.0)
Hemoglobin: 9.1 g/dL — ABNORMAL LOW (ref 12.0–16.0)
MCH: 26.7 pg (ref 26.0–34.0)
MCHC: 32 g/dL (ref 32.0–36.0)
MCV: 83.3 fL (ref 80.0–100.0)
PLATELETS: 396 10*3/uL (ref 150–440)
RBC: 3.42 MIL/uL — ABNORMAL LOW (ref 3.80–5.20)
RDW: 16.8 % — AB (ref 11.5–14.5)
WBC: 7.7 10*3/uL (ref 3.6–11.0)

## 2015-11-10 LAB — BASIC METABOLIC PANEL
ANION GAP: 9 (ref 5–15)
BUN: 20 mg/dL (ref 6–20)
CALCIUM: 9 mg/dL (ref 8.9–10.3)
CO2: 29 mmol/L (ref 22–32)
CREATININE: 0.8 mg/dL (ref 0.44–1.00)
Chloride: 98 mmol/L — ABNORMAL LOW (ref 101–111)
Glucose, Bld: 166 mg/dL — ABNORMAL HIGH (ref 65–99)
Potassium: 4.2 mmol/L (ref 3.5–5.1)
SODIUM: 136 mmol/L (ref 135–145)

## 2015-11-10 LAB — GLUCOSE, CAPILLARY
GLUCOSE-CAPILLARY: 158 mg/dL — AB (ref 65–99)
GLUCOSE-CAPILLARY: 150 mg/dL — AB (ref 65–99)
GLUCOSE-CAPILLARY: 135 mg/dL — AB (ref 65–99)
Glucose-Capillary: 186 mg/dL — ABNORMAL HIGH (ref 65–99)

## 2015-11-10 MED ORDER — LACTULOSE 10 GM/15ML PO SOLN
20.0000 g | Freq: Two times a day (BID) | ORAL | Status: DC | PRN
  Administered 2015-11-10: 18:00:00 20 g via ORAL
  Filled 2015-11-10: qty 30

## 2015-11-10 MED ORDER — NICOTINE 14 MG/24HR TD PT24
14.0000 mg | MEDICATED_PATCH | Freq: Every day | TRANSDERMAL | Status: DC
  Administered 2015-11-10 – 2015-11-13 (×4): 14 mg via TRANSDERMAL
  Filled 2015-11-10 (×4): qty 1

## 2015-11-10 NOTE — Progress Notes (Signed)
[facility] - [place] at [facility]                                                                                                                                                                                            Patient Demographics   [name], is a 75 y.o. female, DOB - Dec 16, 1940, ZOX:[id number]  Admit date - 11/08/2015   Admitting Physician Shaune Pollack, MD  Outpatient Primary MD for the patient is Stephanie Acre, MD   LOS - 2  Subjective: Patient admitted with acute respiratory failure requiring BiPAP. Also noticed to be anemic. Patient was transfused 2 units of packed RBCs.  Complains of shortness of breath. No chest pain. Has some cough. Also says that she is constipated and has no good BM for a week. No  Abdominal pain    Review of Systems:   CONSTITUTIONAL: No documented fever. No fatigue, weakness. No weight gain, no weight loss.  EYES: No blurry or double vision.  ENT: No tinnitus. No postnasal drip. No redness of the oropharynx.  RESPIRATORY: Positive cough, no wheeze, no hemoptysis. Positive dyspnea.  CARDIOVASCULAR: No chest pain. No orthopnea. No palpitations. No syncope.  GASTROINTESTINAL: No nausea, no vomiting or diarrhea. No abdominal pain. No melena or hematochezia. Has constipation. GENITOURINARY: No dysuria or hematuria.  ENDOCRINE: No polyuria or nocturia. No heat or cold intolerance.  HEMATOLOGY: No anemia. No bruising. No bleeding.  INTEGUMENTARY: No rashes. No lesions.  MUSCULOSKELETAL: No arthritis. No swelling. No gout. Complains of pain in the left shoulder with movement NEUROLOGIC: No numbness, tingling, or ataxia. No seizure-type activity.  PSYCHIATRIC: No anxiety. No insomnia. No ADD.    Vitals:   Filed Vitals:   11/09/15 1110 11/09/15 1426 11/09/15 2053 11/10/15 0541  BP:  122/44 91/53 116/58  Pulse:  73 64 77  Temp:  98.7 F (37.1 C) 98.3 F (36.8 C) 98.4 F (36.9 C)  TempSrc:  Oral Oral Oral  Resp:  Height:      Weight:      SpO2: 99% 95% 100% 92%    Wt Readings from Last 3 Encounters:  11/08/15 123.288 kg (271 lb 12.8 oz)  06/08/15 112.719 kg (248 lb 8 oz)  05/28/15 119.16 kg (262 lb 11.2 oz)     Intake/Output Summary (Last 24 hours) at 11/10/15 0925 Last data filed at 11/10/15 0900  Gross per 24 hour  Intake   1166 ml  Output   3100 ml  Net  -1934 ml    Physical Exam:   GENERAL: Pleasant-appearing in no apparent distress.  HEAD, EYES, EARS,  NOSE AND THROAT: Atraumatic, normocephalic. Extraocular muscles are intact. Pupils equal and reactive to light. Sclerae anicteric. No conjunctival injection. No oro-pharyngeal erythema.  NECK: Supple. There is no jugular venous distention. No bruits, no lymphadenopathy, no thyromegaly.  HEART: Regular rate and rhythm,. No murmurs, no rubs, no clicks.  LUNGS: Occasional wheezing no crackles. Decreased breath sounds bilaterally. ABDOMEN: Soft, flat, nontender, nondistended. Has good bowel sounds. No hepatosplenomegaly appreciated.  EXTREMITIES: No evidence of any cyanosis, clubbing, or peripheral edema.  +2 pedal and radial pulses bilaterally.  NEUROLOGIC: The patient is alert, awake, and oriented x3 with no focal motor or sensory deficits appreciated bilaterally.  SKIN: Moist and warm with no rashes appreciated.  Psych: Not anxious, depressed LN: No inguinal LN enlargement    Antibiotics   Anti-infectives    Start     Dose/Rate Route Frequency Ordered Stop   11/09/15 1400  levofloxacin (LEVAQUIN) tablet 500 mg     500 mg Oral Daily 11/09/15 1353        Medications   Scheduled Meds: . amiodarone  200 mg Oral Daily  . antiseptic oral rinse  7 mL Mouth Rinse BID  . aspirin  81 mg Oral Daily  . buPROPion  150 mg Oral Daily  . DULoxetine  60 mg Oral Daily  . gabapentin  100 mg Oral TID  . insulin aspart  0-5 Units Subcutaneous QHS  . insulin aspart  0-9 Units Subcutaneous TID WC  . levofloxacin  500 mg Oral Daily  .  levothyroxine  150 mcg Oral QAC breakfast  . magnesium oxide  400 mg Oral Daily  . methylPREDNISolone (SOLU-MEDROL) injection  60 mg Intravenous Q6H  . metoprolol tartrate  25 mg Oral BID  . mometasone-formoterol  2 puff Inhalation BID  . montelukast  10 mg Oral Daily  . pantoprazole (PROTONIX) IV  40 mg Intravenous Q12H  . rOPINIRole  2 mg Oral QHS  . simvastatin  10 mg Oral QHS  . sodium chloride flush  3 mL Intravenous Q12H  . tiotropium  18 mcg Inhalation Daily   Continuous Infusions:   PRN Meds:.sodium chloride, acetaminophen **OR** acetaminophen, albuterol, docusate sodium, guaiFENesin, ipratropium-albuterol, lactulose, loperamide, ondansetron **OR** ondansetron (ZOFRAN) IV, oxyCODONE, polyvinyl alcohol, simethicone, sodium chloride flush, zolpidem   Data Review:   Micro Results Recent Results (from the past 240 hour(s))  MRSA PCR Screening     Status: None   Collection Time: 11/08/15 10:10 PM  Result Value Ref Range Status   MRSA by PCR NEGATIVE NEGATIVE Final    Comment:        The GeneXpert MRSA Assay (FDA approved for NASAL specimens only), is one component of a comprehensive MRSA colonization surveillance program. It is not intended to diagnose MRSA infection nor to guide or monitor treatment for MRSA infections.     Radiology Reports Ct Head Wo Contrast  11/08/2015  CLINICAL DATA:  Shortness of breath, altered mental status EXAM: CT HEAD WITHOUT CONTRAST TECHNIQUE: Contiguous axial images were obtained from the base of the skull through the vertex without intravenous contrast. COMPARISON:  06/08/2015 FINDINGS: No skull fracture is noted. Paranasal sinuses and mastoid air cells are unremarkable. No intracranial hemorrhage, mass effect or midline shift. Ventricular size is stable from prior exam. Stable cerebral atrophy. 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 No acute cortical infarction. No mass lesion is noted on this unenhanced  scan. Stable mild periventricular chronic white matter disease. IMPRESSION: No acute intracranial abnormality. Stable atrophy and chronic  white matter disease. No definite acute cortical infarction. Electronically Signed   By: Natasha Mead M.D.   On: 11/08/2015 14:05   Ct Angio Chest Pe W/cm &/or Wo Cm  11/08/2015  CLINICAL DATA:  Shortness of breath this morning. EXAM: CT ANGIOGRAPHY CHEST WITH CONTRAST TECHNIQUE: Multidetector CT imaging of the chest was performed using the standard protocol during bolus administration of intravenous contrast. Multiplanar CT image reconstructions and MIPs were obtained to evaluate the vascular anatomy. CONTRAST:  75mL OMNIPAQUE IOHEXOL 350 MG/ML SOLN COMPARISON:  January 09, 2015 FINDINGS: There is no pulmonary embolus. There is no mediastinal or hilar lymphadenopathy. Atherosclerosis of the thoracic aorta is identified without aneurysmal dilatation or dissection. The heart size is normal. There is no pericardial effusion. Images of the lungs demonstrate mild atelectasis of the bilateral posterior lungs and medial right middle lobe. No focal pulmonary mass or pleural effusion is noted. There is no focal pneumonia. There is chronic elevation of right hemidiaphragm unchanged. Degenerative joint changes of the spine are identified. The visualized upper abdominal structures are unremarkable. Previously noted upper pole left kidney cysts is identified unchanged Review of the MIP images confirms the above findings. IMPRESSION: No pulmonary embolus. Atelectasis of bilateral lung bases. Electronically Signed   By: Sherian Rein M.D.   On: 11/08/2015 14:10   Dg Chest Port 1 View  11/08/2015  CLINICAL DATA:  75 year old female with shortness of breath since 05/1939 5 hours. Respiratory distress. Initial encounter. EXAM: PORTABLE CHEST 1 VIEW COMPARISON:  06/08/2015 and earlier. FINDINGS: Portable AP upright view at 1053 hours. Large body habitus. Chronically low but lower lung volumes than  prior. Stable cardiac size and mediastinal contours. No pneumothorax, pulmonary edema or consolidation. IMPRESSION: Low lung volumes.  No acute cardiopulmonary abnormality identified. Electronically Signed   By: Odessa Fleming M.D.   On: 11/08/2015 11:16     CBC  Recent Labs Lab 11/08/15 1030 11/09/15 0626 11/10/15 0550  WBC 11.2* 10.6 7.7  HGB 7.5* 8.3* 9.1*  HCT 24.1* 26.1* 28.5*  PLT 429 359 396  MCV 84.2 86.2 83.3  MCH 26.1 27.4 26.7  MCHC 31.0* 31.8* 32.0  RDW 17.0* 16.6* 16.8*  LYMPHSABS 0.7*  --   --   MONOABS 1.0*  --   --   EOSABS 0.1  --   --   BASOSABS 0.1  --   --     Chemistries   Recent Labs Lab 11/08/15 1030 11/09/15 0626 11/10/15 0550  NA 137 138 136  K 5.2* 4.4 4.2  CL 97* 102 98*  CO2 31 29 29   GLUCOSE 165* 123* 166*  BUN 35* 24* 20  CREATININE 1.52* 0.97 0.80  CALCIUM 8.4* 8.5* 9.0  AST 14*  --   --   ALT 14  --   --   ALKPHOS 95  --   --   BILITOT 0.5  --   --    ------------------------------------------------------------------------------------------------------------------ estimated creatinine clearance is 83.9 mL/min (by C-G formula based on Cr of 0.8). ------------------------------------------------------------------------------------------------------------------ No results for input(s): HGBA1C in the last 72 hours. ------------------------------------------------------------------------------------------------------------------ No results for input(s): CHOL, HDL, LDLCALC, TRIG, CHOLHDL, LDLDIRECT in the last 72 hours. ------------------------------------------------------------------------------------------------------------------  Recent Labs  11/08/15 1030  TSH 6.077*   ------------------------------------------------------------------------------------------------------------------ No results for input(s): VITAMINB12, FOLATE, FERRITIN, TIBC, IRON, RETICCTPCT in the last 72 hours.  Coagulation profile No results for input(s): INR,  PROTIME in the last 168 hours.  No results for input(s): DDIMER in the last 72 hours.  Cardiac  Enzymes  Recent Labs Lab 11/08/15 1030  TROPONINI 0.03   ------------------------------------------------------------------------------------------------------------------ Invalid input(s): POCBNP    Assessment & Plan  Patient is a 75 year old white female nursing home resident  #1 Acute respiratory failure with hypercapnia suspect due to acute on chronic COPD exacerbation as well as respiratory suppressions from narcotics.  Slowly improving, continue IV Levaquin, and nebulizers, IV steroids. Continue oxygen 3 L to keep saturation more than 90%.   #2 Acute metabolic encephalopathy, possible due to narcotics; improved. Asking for pain medications at night to help her with sleep.   #3 Acute renal failure with dehydration. Improved with IV fluids ; likley due to ATN . #4 Hyperkalemia, resolved with IV fluids  #5 Anemia. Possible GI blood loss seen by GI will eventually need EGD placed on Protonix twice a day hold Xarelto Following GI. Patient received 2 units of packed RBC. Hemoglobin stable at 9.1.  #6 Chronic Afib. Hold xarelto. Heart rate stable .continue amiodarone  #7 Chronic diastolic CHF, stable, hold Lasix due to recent recovery from renal failure. . #8 Hypertension. Continue metoprolol  #9 Diabetes. Hold metformin, due to renal failure. continue sliding scale.   #10 OSA. CPAP at night.   11 .constipation: Continue lactulose, Colace.     Code Status Orders        Start     Ordered   11/08/15 1803  Do not attempt resuscitation (DNR)   Continuous    Question Answer Comment  In the event of cardiac or respiratory ARREST Do not call a "code blue"   In the event of cardiac or respiratory ARREST Do not perform Intubation, CPR, defibrillation or ACLS   In the event of cardiac or respiratory ARREST Use medication by any route, position, wound care, and other measures to  relive pain and suffering. May use oxygen, suction and manual treatment of airway obstruction as needed for comfort.      11/08/15 1802    Code Status History    Date Active Date Inactive Code Status Order ID Comments User Context   06/08/2015  9:40 AM 06/10/2015  2:44 PM Full Code [id number]  Alford Highland, MD ED   05/27/2015  7:19 PM 05/31/2015  4:04 PM Full Code [id number]  Enid Baas, MD Inpatient    Advance Directive Documentation        Most Recent Value   Type of Advance Directive  Healthcare Power of Attorney   Pre-existing out of facility DNR order (yellow form or pink MOST form)     "MOST" Form in Place?             Consults  gi  DVT Prophylaxis  scd's   Lab Results  Component Value Date   PLT 396 11/10/2015   Than 50% of time spent in coordination of services  Time Spent in minutes   Senna Lape M.D on 11/10/2015 at 9:25 AM  Between 7am to 6pm - Pager - [phone]  After 6pm go to [email or web address] - password [email or web address] [facility]  [facility]   Office  [phone]

## 2015-11-10 NOTE — Progress Notes (Signed)
Dr Luberta Mutter- 14 mg nicotine patch daily

## 2015-11-10 NOTE — Progress Notes (Addendum)
Subjective: Patient with anemia status post blood transfusion. The patient's hemoglobin is up today. She has not had any bowel movements since being admitted and her only GI complaint besides constipation is that she feels full quickly after eating. The patient has had a colonoscopy 2 years ago.  Objective: Vital signs in last 24 hours: Filed Vitals:   11/09/15 1110 11/09/15 1426 11/09/15 2053 11/10/15 0541  BP:  122/44 91/53 116/58  Pulse:  73 64 77  Temp:  98.7 F (37.1 C) 98.3 F (36.8 C) 98.4 F (36.9 C)  TempSrc:  Oral Oral Oral  Resp:  Height:      Weight:      SpO2: 99% 95% 100% 92%   Weight change:   Intake/Output Summary (Last 24 hours) at 11/10/15 0981 Last data filed at 11/10/15 0900  Gross per 24 hour  Intake   1166 ml  Output   3100 ml  Net  -1934 ml     Exam: Heart:: Regular rate and rhythm Lungs: expiratory rhonchi Abdomen: soft, nontender, normal bowel sounds   Lab Results: @ Micro Results: Recent Results (from the past 240 hour(s))  MRSA PCR Screening     Status: None   Collection Time: 11/08/15 10:10 PM  Result Value Ref Range Status   MRSA by PCR NEGATIVE NEGATIVE Final    Comment:        The GeneXpert MRSA Assay (FDA approved for NASAL specimens only), is one component of a comprehensive MRSA colonization surveillance program. It is not intended to diagnose MRSA infection nor to guide or monitor treatment for MRSA infections.    Studies/Results: Ct Head Wo Contrast  11/08/2015  CLINICAL DATA:  Shortness of breath, altered mental status EXAM: CT HEAD WITHOUT CONTRAST TECHNIQUE: Contiguous axial images were obtained from the base of the skull through the vertex without intravenous contrast. COMPARISON:  06/08/2015 FINDINGS: No skull fracture is noted. Paranasal sinuses and mastoid air cells are unremarkable. No intracranial hemorrhage, mass effect or midline shift. Ventricular size is stable from prior exam. Stable cerebral  atrophy. 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 No acute cortical infarction. No mass lesion is noted on this unenhanced scan. Stable mild periventricular chronic white matter disease. IMPRESSION: No acute intracranial abnormality. Stable atrophy and chronic white matter disease. No definite acute cortical infarction. Electronically Signed   By: Natasha Mead M.D.   On: 11/08/2015 14:05   Ct Angio Chest Pe W/cm &/or Wo Cm  11/08/2015  CLINICAL DATA:  Shortness of breath this morning. EXAM: CT ANGIOGRAPHY CHEST WITH CONTRAST TECHNIQUE: Multidetector CT imaging of the chest was performed using the standard protocol during bolus administration of intravenous contrast. Multiplanar CT image reconstructions and MIPs were obtained to evaluate the vascular anatomy. CONTRAST:  75mL OMNIPAQUE IOHEXOL 350 MG/ML SOLN COMPARISON:  January 09, 2015 FINDINGS: There is no pulmonary embolus. There is no mediastinal or hilar lymphadenopathy. Atherosclerosis of the thoracic aorta is identified without aneurysmal dilatation or dissection. The heart size is normal. There is no pericardial effusion. Images of the lungs demonstrate mild atelectasis of the bilateral posterior lungs and medial right middle lobe. No focal pulmonary mass or pleural effusion is noted. There is no focal pneumonia. There is chronic elevation of right hemidiaphragm unchanged. Degenerative joint changes of the spine are identified. The visualized upper abdominal structures are unremarkable. Previously noted upper pole left kidney cysts is identified unchanged Review of the MIP images confirms the above findings. IMPRESSION: No  pulmonary embolus. Atelectasis of bilateral lung bases. Electronically Signed   By: Sherian Rein M.D.   On: 11/08/2015 14:10   Dg Chest Port 1 View  11/08/2015  CLINICAL DATA:  75 year old female with shortness of breath since 05/1939 5 hours. Respiratory distress. Initial encounter. EXAM: PORTABLE CHEST 1  VIEW COMPARISON:  06/08/2015 and earlier. FINDINGS: Portable AP upright view at 1053 hours. Large body habitus. Chronically low but lower lung volumes than prior. Stable cardiac size and mediastinal contours. No pneumothorax, pulmonary edema or consolidation. IMPRESSION: Low lung volumes.  No acute cardiopulmonary abnormality identified. Electronically Signed   By: Odessa Fleming M.D.   On: 11/08/2015 11:16   Medications: I have reviewed the patient's current medications. Scheduled Meds: . amiodarone  200 mg Oral Daily  . antiseptic oral rinse  7 mL Mouth Rinse BID  . aspirin  81 mg Oral Daily  . buPROPion  150 mg Oral Daily  . DULoxetine  60 mg Oral Daily  . gabapentin  100 mg Oral TID  . insulin aspart  0-5 Units Subcutaneous QHS  . insulin aspart  0-9 Units Subcutaneous TID WC  . levofloxacin  500 mg Oral Daily  . levothyroxine  150 mcg Oral QAC breakfast  . magnesium oxide  400 mg Oral Daily  . methylPREDNISolone (SOLU-MEDROL) injection  60 mg Intravenous Q6H  . metoprolol tartrate  25 mg Oral BID  . mometasone-formoterol  2 puff Inhalation BID  . montelukast  10 mg Oral Daily  . pantoprazole (PROTONIX) IV  40 mg Intravenous Q12H  . rOPINIRole  2 mg Oral QHS  . simvastatin  10 mg Oral QHS  . sodium chloride flush  3 mL Intravenous Q12H  . tiotropium  18 mcg Inhalation Daily   Continuous Infusions:  PRN Meds:.sodium chloride, acetaminophen **OR** acetaminophen, albuterol, docusate sodium, guaiFENesin, ipratropium-albuterol, loperamide, ondansetron **OR** ondansetron (ZOFRAN) IV, oxyCODONE, polyvinyl alcohol, simethicone, sodium chloride flush, zolpidem   Assessment: Active Problems:   Acute respiratory failure without hypercapnia (HCC)   Anemia    Plan: This patient has anemia with high risk for any endoscopic procedure with her COPD and shortness of breath. The patient does have a history of a colonoscopy 2 years ago and it is unlikely that any new neoplasm may have developed in  this amount of time. The patient does have early satiety and should have her upper GI tract investigated. Due to her respiratory status I am suggesting that she have an upper GI series. The patient has been explained this and agrees.   LOS: 2 days   Daren Servando Snare 11/10/2015, 9:11 AM

## 2015-11-11 DIAGNOSIS — K921 Melena: Secondary | ICD-10-CM

## 2015-11-11 LAB — GLUCOSE, CAPILLARY
GLUCOSE-CAPILLARY: 197 mg/dL — AB (ref 65–99)
GLUCOSE-CAPILLARY: 146 mg/dL — AB (ref 65–99)
Glucose-Capillary: 178 mg/dL — ABNORMAL HIGH (ref 65–99)
Glucose-Capillary: 222 mg/dL — ABNORMAL HIGH (ref 65–99)

## 2015-11-11 LAB — OCCULT BLOOD X 1 CARD TO LAB, STOOL: Fecal Occult Bld: POSITIVE — AB

## 2015-11-11 MED ORDER — FUROSEMIDE 40 MG PO TABS
40.0000 mg | ORAL_TABLET | Freq: Every day | ORAL | Status: DC
  Administered 2015-11-11 – 2015-11-13 (×3): 40 mg via ORAL
  Filled 2015-11-11 (×3): qty 1

## 2015-11-11 MED ORDER — METHYLPREDNISOLONE SODIUM SUCC 125 MG IJ SOLR
60.0000 mg | Freq: Two times a day (BID) | INTRAMUSCULAR | Status: DC
  Administered 2015-11-11 – 2015-11-12 (×2): 60 mg via INTRAVENOUS
  Filled 2015-11-11 (×2): qty 2

## 2015-11-11 NOTE — Progress Notes (Signed)
Bipap refused 

## 2015-11-11 NOTE — Progress Notes (Signed)
Subjective: This patient was admitted with anemia and was found to have heme positive stools. The patient   Objective: Vital signs in last 24 hours: Filed Vitals:   11/10/15 0541 11/10/15 1429 11/10/15 2033 11/11/15 0520  BP: 116/58 131/52 103/50 108/52  Pulse: 77 68 73 74  Temp: 98.4 F (36.9 C) 99.4 F (37.4 C) 98.3 F (36.8 C) 98 F (36.7 C)  TempSrc: Oral Oral Oral Oral  Resp: Height:      Weight:      SpO2: 92% 96% 97% 97%   Weight change:  No intake or output data in the 24 hours ending 11/11/15 1025   Exam: Heart:: Regular rate and rhythm Lungs: expiratory rhonchi Abdomen: soft, nontender, normal bowel sounds   Lab Results: @ Micro Results: Recent Results (from the past 240 hour(s))  MRSA PCR Screening     Status: None   Collection Time: 11/08/15 10:10 PM  Result Value Ref Range Status   MRSA by PCR NEGATIVE NEGATIVE Final    Comment:        The GeneXpert MRSA Assay (FDA approved for NASAL specimens only), is one component of a comprehensive MRSA colonization surveillance program. It is not intended to diagnose MRSA infection nor to guide or monitor treatment for MRSA infections.    Studies/Results: No results found. Medications: I have reviewed the patient's current medications. Scheduled Meds: . amiodarone  200 mg Oral Daily  . antiseptic oral rinse  7 mL Mouth Rinse BID  . aspirin  81 mg Oral Daily  . buPROPion  150 mg Oral Daily  . DULoxetine  60 mg Oral Daily  . gabapentin  100 mg Oral TID  . insulin aspart  0-5 Units Subcutaneous QHS  . insulin aspart  0-9 Units Subcutaneous TID WC  . levofloxacin  500 mg Oral Daily  . levothyroxine  150 mcg Oral QAC breakfast  . magnesium oxide  400 mg Oral Daily  . methylPREDNISolone (SOLU-MEDROL) injection  60 mg Intravenous Q12H  . metoprolol tartrate  25 mg Oral BID  . mometasone-formoterol  2 puff Inhalation BID  . montelukast  10 mg Oral Daily  . nicotine  14 mg Transdermal  Daily  . pantoprazole (PROTONIX) IV  40 mg Intravenous Q12H  . rOPINIRole  2 mg Oral QHS  . simvastatin  10 mg Oral QHS  . sodium chloride flush  3 mL Intravenous Q12H  . tiotropium  18 mcg Inhalation Daily   Continuous Infusions:  PRN Meds:.sodium chloride, acetaminophen **OR** acetaminophen, albuterol, docusate sodium, guaiFENesin, ipratropium-albuterol, lactulose, loperamide, ondansetron **OR** ondansetron (ZOFRAN) IV, oxyCODONE, polyvinyl alcohol, simethicone, sodium chloride flush, zolpidem   Assessment: Active Problems:   Acute respiratory failure without hypercapnia (HCC)   Anemia   Early satiety    Plan: This patient has heme positive stools and early satiety area the patient is in no condition to undergo any GI procedures at this time due to her shortness of breath and oxygen dependency. She also was reported to have a colonoscopy 2 years ago. The patient is set up for an upper GI series through radiology for tomorrow. Patient has been explained the plan and agrees with it.   LOS: 3 days   Daren Servando Snare 11/11/2015, 10:25 AM

## 2015-11-11 NOTE — Progress Notes (Signed)
[facility] Physicians - [place] at [facility]                                                                                                                                                                                            Patient Demographics   [name], is a 75 y.o. female, DOB - 04/27/1941, WUJ:[id number]  Admit date - 11/08/2015   Admitting Physician Shaune Pollack, MD  Outpatient Primary MD for the patient is Stephanie Acre, MD   LOS - 3  Subjective: Patient admitted with acute respiratory failure requiring BiPAP. Also noticed to be anemic. Patient was transfused 2 units of packed RBCs.  Abdominal discomfort today. Has small BM yesterday. Still feels full.  Review of Systems:   CONSTITUTIONAL: No documented fever. No fatigue, weakness. No weight gain, no weight loss.  EYES: No blurry or double vision.  ENT: No tinnitus. No postnasal drip. No redness of the oropharynx.  RESPIRATORY: Positive cough, no wheeze, no hemoptysis. Positive dyspnea.  CARDIOVASCULAR: No chest pain. No orthopnea. No palpitations. No syncope.  GASTROINTESTINAL: No nausea, no vomiting or diarrhea. No abdominal pain. No melena or hematochezia. Has constipation. GENITOURINARY: No dysuria or hematuria.  ENDOCRINE: No polyuria or nocturia. No heat or cold intolerance.  HEMATOLOGY: No anemia. No bruising. No bleeding.  INTEGUMENTARY: No rashes. No lesions.  MUSCULOSKELETAL: No arthritis. No swelling. No gout. Complains of pain in the left shoulder with movement NEUROLOGIC: No numbness, tingling, or ataxia. No seizure-type activity.  PSYCHIATRIC: No anxiety. No insomnia. No ADD.    Vitals:   Filed Vitals:   11/10/15 0541 11/10/15 1429 11/10/15 2033 11/11/15 0520  BP: 116/58 131/52 103/50 108/52  Pulse: 77 68 73 74  Temp: 98.4 F (36.9 C) 99.4 F (37.4 C) 98.3 F (36.8 C) 98 F (36.7 C)  TempSrc: Oral Oral Oral Oral  Resp: Height:      Weight:      SpO2: 92% 96% 97% 97%     Wt Readings from Last 3 Encounters:  11/08/15 123.288 kg (271 lb 12.8 oz)  06/08/15 112.719 kg (248 lb 8 oz)  05/28/15 119.16 kg (262 lb 11.2 oz)    No intake or output data in the 24 hours ending 11/11/15 1019  Physical Exam:   GENERAL: Pleasant-appearing in no apparent distress.  HEAD, EYES, EARS, NOSE AND THROAT: Atraumatic, normocephalic. Extraocular muscles are intact. Pupils equal and reactive to light. Sclerae anicteric. No conjunctival injection. No oro-pharyngeal erythema.  NECK: Supple. There is no jugular venous distention. No bruits, no lymphadenopathy, no thyromegaly.  HEART: Regular rate and rhythm,. No  murmurs, no rubs, no clicks.  LUNGS: Clear to auscultation, decreased breath sounds. ABDOMEN: Soft, flat, nontender, nondistended. Has good bowel sounds. No hepatosplenomegaly appreciated.  EXTREMITIES: No evidence of any cyanosis, clubbing, or peripheral edema.  +2 pedal and radial pulses bilaterally.  NEUROLOGIC: The patient is alert, awake, and oriented x3 with no focal motor or sensory deficits appreciated bilaterally.  SKIN: Moist and warm with no rashes appreciated.  Psych: Not anxious, depressed LN: No inguinal LN enlargement    Antibiotics   Anti-infectives    Start     Dose/Rate Route Frequency Ordered Stop   11/09/15 1400  levofloxacin (LEVAQUIN) tablet 500 mg     500 mg Oral Daily 11/09/15 1353        Medications   Scheduled Meds: . amiodarone  200 mg Oral Daily  . antiseptic oral rinse  7 mL Mouth Rinse BID  . aspirin  81 mg Oral Daily  . buPROPion  150 mg Oral Daily  . DULoxetine  60 mg Oral Daily  . gabapentin  100 mg Oral TID  . insulin aspart  0-5 Units Subcutaneous QHS  . insulin aspart  0-9 Units Subcutaneous TID WC  . levofloxacin  500 mg Oral Daily  . levothyroxine  150 mcg Oral QAC breakfast  . magnesium oxide  400 mg Oral Daily  . methylPREDNISolone (SOLU-MEDROL) injection  60 mg Intravenous Q6H  . metoprolol tartrate  25 mg Oral  BID  . mometasone-formoterol  2 puff Inhalation BID  . montelukast  10 mg Oral Daily  . nicotine  14 mg Transdermal Daily  . pantoprazole (PROTONIX) IV  40 mg Intravenous Q12H  . rOPINIRole  2 mg Oral QHS  . simvastatin  10 mg Oral QHS  . sodium chloride flush  3 mL Intravenous Q12H  . tiotropium  18 mcg Inhalation Daily   Continuous Infusions:   PRN Meds:.sodium chloride, acetaminophen **OR** acetaminophen, albuterol, docusate sodium, guaiFENesin, ipratropium-albuterol, lactulose, loperamide, ondansetron **OR** ondansetron (ZOFRAN) IV, oxyCODONE, polyvinyl alcohol, simethicone, sodium chloride flush, zolpidem   Data Review:   Micro Results Recent Results (from the past 240 hour(s))  MRSA PCR Screening     Status: None   Collection Time: 11/08/15 10:10 PM  Result Value Ref Range Status   MRSA by PCR NEGATIVE NEGATIVE Final    Comment:        The GeneXpert MRSA Assay (FDA approved for NASAL specimens only), is one component of a comprehensive MRSA colonization surveillance program. It is not intended to diagnose MRSA infection nor to guide or monitor treatment for MRSA infections.     Radiology Reports Ct Head Wo Contrast  11/08/2015  CLINICAL DATA:  Shortness of breath, altered mental status EXAM: CT HEAD WITHOUT CONTRAST TECHNIQUE: Contiguous axial images were obtained from the base of the skull through the vertex without intravenous contrast. COMPARISON:  06/08/2015 FINDINGS: No skull fracture is noted. Paranasal sinuses and mastoid air cells are unremarkable. No intracranial hemorrhage, mass effect or midline shift. Ventricular size is stable from prior exam. Stable cerebral atrophy. 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 No acute cortical infarction. No mass lesion is noted on this unenhanced scan. Stable mild periventricular chronic white matter disease. IMPRESSION: No acute intracranial abnormality. Stable atrophy and chronic white matter  disease. No definite acute cortical infarction. Electronically Signed   By: Natasha Mead M.D.   On: 11/08/2015 14:05   Ct Angio Chest Pe W/cm &/or Wo Cm  11/08/2015  CLINICAL DATA:  Shortness  of breath this morning. EXAM: CT ANGIOGRAPHY CHEST WITH CONTRAST TECHNIQUE: Multidetector CT imaging of the chest was performed using the standard protocol during bolus administration of intravenous contrast. Multiplanar CT image reconstructions and MIPs were obtained to evaluate the vascular anatomy. CONTRAST:  75mL OMNIPAQUE IOHEXOL 350 MG/ML SOLN COMPARISON:  January 09, 2015 FINDINGS: There is no pulmonary embolus. There is no mediastinal or hilar lymphadenopathy. Atherosclerosis of the thoracic aorta is identified without aneurysmal dilatation or dissection. The heart size is normal. There is no pericardial effusion. Images of the lungs demonstrate mild atelectasis of the bilateral posterior lungs and medial right middle lobe. No focal pulmonary mass or pleural effusion is noted. There is no focal pneumonia. There is chronic elevation of right hemidiaphragm unchanged. Degenerative joint changes of the spine are identified. The visualized upper abdominal structures are unremarkable. Previously noted upper pole left kidney cysts is identified unchanged Review of the MIP images confirms the above findings. IMPRESSION: No pulmonary embolus. Atelectasis of bilateral lung bases. Electronically Signed   By: Sherian Rein M.D.   On: 11/08/2015 14:10   Dg Chest Port 1 View  11/08/2015  CLINICAL DATA:  75 year old female with shortness of breath since 05/1939 5 hours. Respiratory distress. Initial encounter. EXAM: PORTABLE CHEST 1 VIEW COMPARISON:  06/08/2015 and earlier. FINDINGS: Portable AP upright view at 1053 hours. Large body habitus. Chronically low but lower lung volumes than prior. Stable cardiac size and mediastinal contours. No pneumothorax, pulmonary edema or consolidation. IMPRESSION: Low lung volumes.  No acute  cardiopulmonary abnormality identified. Electronically Signed   By: Odessa Fleming M.D.   On: 11/08/2015 11:16     CBC  Recent Labs Lab 11/08/15 1030 11/09/15 0626 11/10/15 0550  WBC 11.2* 10.6 7.7  HGB 7.5* 8.3* 9.1*  HCT 24.1* 26.1* 28.5*  PLT 429 359 396  MCV 84.2 86.2 83.3  MCH 26.1 27.4 26.7  MCHC 31.0* 31.8* 32.0  RDW 17.0* 16.6* 16.8*  LYMPHSABS 0.7*  --   --   MONOABS 1.0*  --   --   EOSABS 0.1  --   --   BASOSABS 0.1  --   --     Chemistries   Recent Labs Lab 11/08/15 1030 11/09/15 0626 11/10/15 0550  NA 137 138 136  K 5.2* 4.4 4.2  CL 97* 102 98*  CO2 31 29 29   GLUCOSE 165* 123* 166*  BUN 35* 24* 20  CREATININE 1.52* 0.97 0.80  CALCIUM 8.4* 8.5* 9.0  AST 14*  --   --   ALT 14  --   --   ALKPHOS 95  --   --   BILITOT 0.5  --   --    ------------------------------------------------------------------------------------------------------------------ estimated creatinine clearance is 83.9 mL/min (by C-G formula based on Cr of 0.8). ------------------------------------------------------------------------------------------------------------------ No results for input(s): HGBA1C in the last 72 hours. ------------------------------------------------------------------------------------------------------------------ No results for input(s): CHOL, HDL, LDLCALC, TRIG, CHOLHDL, LDLDIRECT in the last 72 hours. ------------------------------------------------------------------------------------------------------------------  Recent Labs  11/08/15 1030  TSH 6.077*   ------------------------------------------------------------------------------------------------------------------ No results for input(s): VITAMINB12, FOLATE, FERRITIN, TIBC, IRON, RETICCTPCT in the last 72 hours.  Coagulation profile No results for input(s): INR, PROTIME in the last 168 hours.  No results for input(s): DDIMER in the last 72 hours.  Cardiac Enzymes  Recent Labs Lab 11/08/15 1030   TROPONINI 0.03   ------------------------------------------------------------------------------------------------------------------ Invalid input(s): POCBNP    Assessment & Plan  Patient is a 75 year old white female nursing home resident  #1 Acute respiratory failure with hypercapnia  suspect due to acute on chronic COPD exacerbation as well as respiratory suppressions from narcotics.  Slowly improving, continue IV Levaquin, and nebulizers,  Continue oxygen 3 L to keep saturation more than 90%. Decreased the dose of Solu-Medrol has she has no wheezing.  #2 Acute metabolic encephalopathy, possible due to narcotics; improved. Asking for pain medications at night to help her with sleep.   #3 Acute renal failure with dehydration. Improved with IV fluids ; likley due to ATN . #4 Hyperkalemia, resolved with IV fluids  #5 Anemia. Possible GI blood loss seen by GI will eventually need EGD placed on Protonix twice a day hold Xarelto Following GI. Patient received 2 units of packed RBC. Hemoglobin stable at 9.1.  #6 Chronic Afib. Hold xarelto. Heart rate stable .continue amiodarone was Xarelto secondary to anemia requiring transfusions.  #7 Chronic diastolic CHF, stable/restart the Lasix . #8 Hypertension. Continue metoprolol  #9 Diabetes. Hold metformin, due to recovery from renal failure. continue sliding scale.   #10 OSA. CPAP at night.   11 .constipation: Continue lactulose, Colace.     Code Status Orders        Start     Ordered   11/08/15 1803  Do not attempt resuscitation (DNR)   Continuous    Question Answer Comment  In the event of cardiac or respiratory ARREST Do not call a "code blue"   In the event of cardiac or respiratory ARREST Do not perform Intubation, CPR, defibrillation or ACLS   In the event of cardiac or respiratory ARREST Use medication by any route, position, wound care, and other measures to relive pain and suffering. May use oxygen, suction and manual  treatment of airway obstruction as needed for comfort.      11/08/15 1802    Code Status History    Date Active Date Inactive Code Status Order ID Comments User Context   06/08/2015  9:40 AM 06/10/2015  2:44 PM Full Code [id number]  Alford Highland, MD ED   05/27/2015  7:19 PM 05/31/2015  4:04 PM Full Code [id number]  Enid Baas, MD Inpatient    Advance Directive Documentation        Most Recent Value   Type of Advance Directive  Healthcare Power of Attorney   Pre-existing out of facility DNR order (yellow form or pink MOST form)     "MOST" Form in Place?             Consults  gi  DVT Prophylaxis  scd's   Lab Results  Component Value Date   PLT 396 11/10/2015   Than 50% of time spent in coordination of services  Time Spent in minutes   Cia Garretson M.D on 11/11/2015 at 10:19 AM  Between 7am to 6pm - Pager - [phone]  After 6pm go to [email or web address] - password [email or web address] [facility]  [facility]   Office  [phone]

## 2015-11-12 ENCOUNTER — Inpatient Hospital Stay: Payer: Medicare Other

## 2015-11-12 LAB — BASIC METABOLIC PANEL
ANION GAP: 7 (ref 5–15)
BUN: 39 mg/dL — ABNORMAL HIGH (ref 6–20)
CHLORIDE: 100 mmol/L — AB (ref 101–111)
CO2: 29 mmol/L (ref 22–32)
Calcium: 8.7 mg/dL — ABNORMAL LOW (ref 8.9–10.3)
Creatinine, Ser: 0.99 mg/dL (ref 0.44–1.00)
GFR calc Af Amer: >60 mL/min (ref >60)
GFR, EST NON AFRICAN AMERICAN: 55 mL/min — AB (ref >60)
GLUCOSE: 175 mg/dL — AB (ref 65–99)
POTASSIUM: 4.3 mmol/L (ref 3.5–5.1)
SODIUM: 136 mmol/L (ref 135–145)

## 2015-11-12 LAB — GLUCOSE, CAPILLARY
GLUCOSE-CAPILLARY: 175 mg/dL — AB (ref 65–99)
GLUCOSE-CAPILLARY: 206 mg/dL — AB (ref 65–99)
GLUCOSE-CAPILLARY: 180 mg/dL — AB (ref 65–99)
Glucose-Capillary: 216 mg/dL — ABNORMAL HIGH (ref 65–99)

## 2015-11-12 MED ORDER — PANTOPRAZOLE SODIUM 40 MG PO TBEC
40.0000 mg | DELAYED_RELEASE_TABLET | Freq: Two times a day (BID) | ORAL | Status: DC
  Administered 2015-11-12 – 2015-11-13 (×2): 40 mg via ORAL
  Filled 2015-11-12 (×2): qty 1

## 2015-11-12 MED ORDER — PREDNISONE 50 MG PO TABS
50.0000 mg | ORAL_TABLET | Freq: Every day | ORAL | Status: DC
  Administered 2015-11-13: 50 mg via ORAL
  Filled 2015-11-12: qty 1

## 2015-11-12 NOTE — Progress Notes (Signed)
Inpatient Diabetes Program Recommendations  AACE/ADA: New Consensus Statement on Inpatient Glycemic Control (2015)  Target Ranges:  Prepandial:   less than 140 mg/dL      Peak postprandial:   less than 180 mg/dL (1-2 hours)      Critically ill patients:  140 - 180 mg/dL   Review of Glycemic Control:  Results for LUNABELLE, OATLEY (MRN [id number]) as of 11/12/2015 13:28  Ref. Range 11/11/2015 12:00 11/11/2015 17:06 11/11/2015 21:09 11/12/2015 08:24 11/12/2015 11:19  Glucose-Capillary Latest Ref Range: 65-99 mg/dL 409 (H) 811 (H) 914 (H) 175 (H) 216 (H)   Diabetes history: Type 2 diabetes Outpatient Diabetes medications: Metformin 250 mg daily Current orders for Inpatient glycemic control:  Novolog sensitive tid with meals and HS, Prednisone 50 mg daily  Inpatient Diabetes Program Recommendations:    May consider increasing Novolog correction to moderate while patient is on PO Prednisone.   Thanks, Beryl Meager, RN, BC-ADM Inpatient Diabetes Coordinator Pager [phone] (8a-5p)

## 2015-11-12 NOTE — NC FL2 (Signed)
[place] MEDICAID FL2 LEVEL OF CARE SCREENING TOOL     IDENTIFICATION  Patient Name: BALBINA DEPACE Birthdate: 04/22/1941 Sex: female Admission Date (Current Location): 11/08/2015  [place] and [place] Number:  Chiropodist and Address:  [facility], [address] [place], [place] [address]      Provider Number: [id number]  Attending Physician Name and Address:  Katha Hamming, MD  Relative Name and Phone Number:       Current Level of Care: Hospital Recommended Level of Care: Skilled Nursing Facility Prior Approval Number:    Date Approved/Denied:   PASRR Number:  [id number])  Discharge Plan: SNF    Current Diagnoses: Patient Active Problem List   Diagnosis Date Noted  . Blood in stool   . Early satiety   . Acute respiratory failure without hypercapnia (HCC) 11/08/2015  . Anemia 11/08/2015  . Hypertensive heart disease   . Encephalopathy acute 06/08/2015  . Sepsis (HCC) 05/27/2015  . Pressure ulcer 05/27/2015  . OSA on CPAP 04/11/2015  . Abdominal pain, unspecified site 10/25/2013  . Gallstone 10/25/2013  . Chronic diastolic CHF (congestive heart failure) (HCC) 09/06/2013  . Edema 01/14/2012  . Hypotension 01/14/2012  . Atrial flutter (HCC) 06/19/2011  . CAD (coronary artery disease) 06/19/2011  . Hyperlipidemia 06/19/2011  . Restless leg 06/19/2011  . Mental status change 06/19/2011    Orientation RESPIRATION BLADDER Height & Weight     Self, Time, Situation, Place  O2 (Nasal Cannula 3 (L/min) ) Incontinent Weight: 271 lb 12.8 oz (123.288 kg) Height:   (167.6 cm)  BEHAVIORAL SYMPTOMS/MOOD NEUROLOGICAL BOWEL NUTRITION STATUS   (None)  (None ) Incontinent Diet (Heart Health/ Carb Modified )  AMBULATORY STATUS COMMUNICATION OF NEEDS Skin   Extensive Assist Verbally Normal                       Personal Care Assistance Level of Assistance  Bathing, Feeding, Dressing Bathing Assistance: Limited  assistance Feeding assistance: Independent Dressing Assistance: Limited assistance     Functional Limitations Info  Sight, Hearing, Speech Sight Info: Adequate Hearing Info: Adequate Speech Info: Adequate    SPECIAL CARE FACTORS FREQUENCY  PT (By licensed PT)     PT Frequency:  (5)              Contractures      Additional Factors Info  Code Status, Isolation Precautions, Allergies, Insulin Sliding Scale Code Status Info:  (DNR ) Allergies Info:  (Lisinopril, Latex)   Insulin Sliding Scale Info:  (insulin aspart (novoLOG) injection 0-9 Units- 3 times daily & insulin aspart (novoLOG) injection 0-5 Units- at bedtime ) Isolation Precautions Info:  (Contact precautions- Extended spectrum beta lactamase)     Current Medications (11/12/2015):  This is the current hospital active medication list Current Facility-Administered Medications  Medication Dose Route Frequency Provider Last Rate Last Dose  . 0.9 %  sodium chloride infusion  250 mL Intravenous PRN Shaune Pollack, MD      . acetaminophen (TYLENOL) tablet 650 mg  650 mg Oral Q6H PRN Shaune Pollack, MD   650 mg at 11/11/15 1449   Or  . acetaminophen (TYLENOL) suppository 650 mg  650 mg Rectal Q6H PRN Shaune Pollack, MD      . albuterol (PROVENTIL) (2.5 MG/3ML) 0.083% nebulizer solution 2.5 mg  2.5 mg Nebulization Q2H PRN Shaune Pollack, MD      . amiodarone (PACERONE) tablet 200 mg  200 mg Oral Daily Adreonna Yontz  Imogene Burn, MD   200 mg at 11/12/15 1101  . antiseptic oral rinse (CPC / CETYLPYRIDINIUM CHLORIDE 0.05%) solution 7 mL  7 mL Mouth Rinse BID Shaune Pollack, MD   7 mL at 11/12/15 1115  . aspirin chewable tablet 81 mg  81 mg Oral Daily Shaune Pollack, MD   81 mg at 11/12/15 1101  . buPROPion (WELLBUTRIN XL) 24 hr tablet 150 mg  150 mg Oral Daily Shaune Pollack, MD   150 mg at 11/12/15 1101  . docusate sodium (COLACE) capsule 100 mg  100 mg Oral Q12H PRN Shaune Pollack, MD   100 mg at 11/10/15 0849  . DULoxetine (CYMBALTA) DR capsule 60 mg  60 mg Oral Daily Shaune Pollack, MD    60 mg at 11/12/15 1100  . furosemide (LASIX) tablet 40 mg  40 mg Oral Daily Katha Hamming, MD   40 mg at 11/12/15 1101  . gabapentin (NEURONTIN) capsule 100 mg  100 mg Oral TID Shaune Pollack, MD   100 mg at 11/12/15 1101  . guaiFENesin (MUCINEX) 12 hr tablet 600 mg  600 mg Oral Q12H PRN Shaune Pollack, MD      . insulin aspart (novoLOG) injection 0-5 Units  0-5 Units Subcutaneous QHS Shaune Pollack, MD   2 Units at 11/11/15 2131  . insulin aspart (novoLOG) injection 0-9 Units  0-9 Units Subcutaneous TID WC Shaune Pollack, MD   3 Units at 11/12/15 1207  . ipratropium-albuterol (DUONEB) 0.5-2.5 (3) MG/3ML nebulizer solution 3 mL  3 mL Nebulization Q6H PRN Shaune Pollack, MD      . lactulose (CHRONULAC) 10 GM/15ML solution 20 g  20 g Oral BID PRN Katha Hamming, MD   20 g at 11/10/15 1749  . levofloxacin (LEVAQUIN) tablet 500 mg  500 mg Oral Daily Katha Hamming, MD   500 mg at 11/12/15 1101  . levothyroxine (SYNTHROID, LEVOTHROID) tablet 150 mcg  150 mcg Oral QAC breakfast Shaune Pollack, MD   150 mcg at 11/12/15 [id number]  . loperamide (IMODIUM) capsule 2 mg  2 mg Oral Q6H PRN Shaune Pollack, MD      . magnesium oxide (MAG-OX) tablet 400 mg  400 mg Oral Daily Shaune Pollack, MD   400 mg at 11/12/15 1101  . metoprolol tartrate (LOPRESSOR) tablet 25 mg  25 mg Oral BID Auburn Bilberry, MD   25 mg at 11/12/15 1101  . mometasone-formoterol (DULERA) 100-5 MCG/ACT inhaler 2 puff  2 puff Inhalation BID Shaune Pollack, MD   2 puff at 11/12/15 0854  . montelukast (SINGULAIR) tablet 10 mg  10 mg Oral Daily Shaune Pollack, MD   10 mg at 11/12/15 1101  . nicotine (NICODERM CQ - dosed in mg/24 hours) patch 14 mg  14 mg Transdermal Daily Katha Hamming, MD   14 mg at 11/12/15 1103  . ondansetron (ZOFRAN) tablet 4 mg  4 mg Oral Q6H PRN Shaune Pollack, MD       Or  . ondansetron [facility]) injection 4 mg  4 mg Intravenous Q6H PRN Shaune Pollack, MD      . oxyCODONE (Oxy IR/ROXICODONE) immediate release tablet 5 mg  5 mg Oral Q8H PRN Oralia Manis, MD   5 mg  at 11/12/15 1101  . pantoprazole (PROTONIX) injection 40 mg  40 mg Intravenous Q12H Auburn Bilberry, MD   40 mg at 11/12/15 1102  . polyvinyl alcohol (LIQUIFILM TEARS) 1.4 % ophthalmic solution 1 drop  1 drop Both Eyes Q6H PRN Shaune Pollack, MD      . [  START ON 11/13/2015] predniSONE (DELTASONE) tablet 50 mg  50 mg Oral Q breakfast Katha Hamming, MD      . rOPINIRole (REQUIP) tablet 2 mg  2 mg Oral QHS Shaune Pollack, MD   2 mg at 11/11/15 2033  . simethicone (MYLICON) chewable tablet 80 mg  80 mg Oral Q6H PRN Shaune Pollack, MD      . simvastatin (ZOCOR) tablet 10 mg  10 mg Oral QHS Shaune Pollack, MD   10 mg at 11/11/15 2033  . sodium chloride flush (NS) 0.9 % injection 3 mL  3 mL Intravenous Q12H Shaune Pollack, MD   3 mL at 11/12/15 1115  . sodium chloride flush (NS) 0.9 % injection 3 mL  3 mL Intravenous PRN Shaune Pollack, MD      . tiotropium [facility]) inhalation capsule 18 mcg  18 mcg Inhalation Daily Shaune Pollack, MD   18 mcg at 11/12/15 1114  . zolpidem (AMBIEN) tablet 5 mg  5 mg Oral QHS PRN Oralia Manis, MD         Discharge Medications: Please see discharge summary for a list of discharge medications.  Relevant Imaging Results:  Relevant Lab Results:   Additional Information  (SSN [id number])  Verta Ellen Sunkins, LCSW

## 2015-11-12 NOTE — Progress Notes (Signed)
Pt pulled her IV out while asleep.  Dr. Luberta Mutter made aware and is ok with pt not having IV access.  Protonix changed to oral.  Orson Ape, RN

## 2015-11-12 NOTE — Progress Notes (Signed)
[facility] - [place] at [facility]                                                                                                                                                                                            Patient Demographics   [name], is a 75 y.o. female, DOB - [birth year], AVW:[id number]  Admit date - 11/08/2015   Admitting Physician Shaune Pollack, MD  Outpatient Primary MD for the patient is Stephanie Acre, MD   LOS - 4  Subjective: Breathing  Better.had good BM>pt admitted for  Respiratory failure  Due to copd exacerbation. Still feels  Satiety.  Review of Systems:   CONSTITUTIONAL: No documented fever. No fatigue, weakness. No weight gain, no weight loss.  EYES: No blurry or double vision.  ENT: No tinnitus. No postnasal drip. No redness of the oropharynx.  RESPIRATORY: Positive cough, no wheeze, no hemoptysis. Positive dyspnea.  CARDIOVASCULAR: No chest pain. No orthopnea. No palpitations. No syncope.  GASTROINTESTINAL: No nausea, no vomiting or diarrhea. No abdominal pain. No melena or hematochezia. Has constipation. GENITOURINARY: No dysuria or hematuria.  ENDOCRINE: No polyuria or nocturia. No heat or cold intolerance.  HEMATOLOGY: No anemia. No bruising. No bleeding.  INTEGUMENTARY: No rashes. No lesions.  MUSCULOSKELETAL: No arthritis. No swelling. No gout. Complains of pain in the left shoulder with movement NEUROLOGIC: No numbness, tingling, or ataxia. No seizure-type activity.  PSYCHIATRIC: No anxiety. No insomnia. No ADD.    Vitals:   Filed Vitals:   11/11/15 1219 11/11/15 2108 11/12/15 0545 11/12/15 0826  BP: 139/49 141/50 110/50 157/59  Pulse: 71 65 74 64  Temp: 98.7 F (37.1 C) 98.1 F (36.7 C) 98.3 F (36.8 C) 98 F (36.7 C)  TempSrc: Oral Oral Oral Oral  Resp: 18 18 18 18   Height:      Weight:      SpO2: 94% 91% 96% 98%    Wt Readings from Last 3 Encounters:  11/08/15 123.288 kg (271 lb 12.8 oz)  06/08/15  112.719 kg (248 lb 8 oz)  05/28/15 119.16 kg (262 lb 11.2 oz)    No intake or output data in the 24 hours ending 11/12/15 1159  Physical Exam:   GENERAL: Pleasant-appearing in no apparent distress.  HEAD, EYES, EARS, NOSE AND THROAT: Atraumatic, normocephalic. Extraocular muscles are intact. Pupils equal and reactive to light. Sclerae anicteric. No conjunctival injection. No oro-pharyngeal erythema.  NECK: Supple. There is no jugular venous distention. No bruits, no lymphadenopathy, no thyromegaly.  HEART: Regular rate and rhythm,. No murmurs, no rubs, no clicks.  LUNGS: Clear to auscultation, decreased breath sounds.  ABDOMEN: Soft, flat, nontender, nondistended. Has good bowel sounds. No hepatosplenomegaly appreciated.  EXTREMITIES: No evidence of any cyanosis, clubbing, or peripheral edema.  +2 pedal and radial pulses bilaterally.  NEUROLOGIC: The patient is alert, awake, and oriented x3 with no focal motor or sensory deficits appreciated bilaterally.  SKIN: Moist and warm with no rashes appreciated.  Psych: Not anxious, depressed LN: No inguinal LN enlargement    Antibiotics   Anti-infectives    Start     Dose/Rate Route Frequency Ordered Stop   11/09/15 1400  levofloxacin (LEVAQUIN) tablet 500 mg     500 mg Oral Daily 11/09/15 1353        Medications   Scheduled Meds: . amiodarone  200 mg Oral Daily  . antiseptic oral rinse  7 mL Mouth Rinse BID  . aspirin  81 mg Oral Daily  . buPROPion  150 mg Oral Daily  . DULoxetine  60 mg Oral Daily  . furosemide  40 mg Oral Daily  . gabapentin  100 mg Oral TID  . insulin aspart  0-5 Units Subcutaneous QHS  . insulin aspart  0-9 Units Subcutaneous TID WC  . levofloxacin  500 mg Oral Daily  . levothyroxine  150 mcg Oral QAC breakfast  . magnesium oxide  400 mg Oral Daily  . methylPREDNISolone (SOLU-MEDROL) injection  60 mg Intravenous Q12H  . metoprolol tartrate  25 mg Oral BID  . mometasone-formoterol  2 puff Inhalation BID  .  montelukast  10 mg Oral Daily  . nicotine  14 mg Transdermal Daily  . pantoprazole (PROTONIX) IV  40 mg Intravenous Q12H  . rOPINIRole  2 mg Oral QHS  . simvastatin  10 mg Oral QHS  . sodium chloride flush  3 mL Intravenous Q12H  . tiotropium  18 mcg Inhalation Daily   Continuous Infusions:   PRN Meds:.sodium chloride, acetaminophen **OR** acetaminophen, albuterol, docusate sodium, guaiFENesin, ipratropium-albuterol, lactulose, loperamide, ondansetron **OR** ondansetron (ZOFRAN) IV, oxyCODONE, polyvinyl alcohol, simethicone, sodium chloride flush, zolpidem   Data Review:   Micro Results Recent Results (from the past 240 hour(s))  MRSA PCR Screening     Status: None   Collection Time: 11/08/15 10:10 PM  Result Value Ref Range Status   MRSA by PCR NEGATIVE NEGATIVE Final    Comment:        The GeneXpert MRSA Assay (FDA approved for NASAL specimens only), is one component of a comprehensive MRSA colonization surveillance program. It is not intended to diagnose MRSA infection nor to guide or monitor treatment for MRSA infections.     Radiology Reports Ct Head Wo Contrast  11/08/2015  CLINICAL DATA:  Shortness of breath, altered mental status EXAM: CT HEAD WITHOUT CONTRAST TECHNIQUE: Contiguous axial images were obtained from the base of the skull through the vertex without intravenous contrast. COMPARISON:  06/08/2015 FINDINGS: No skull fracture is noted. Paranasal sinuses and mastoid air cells are unremarkable. No intracranial hemorrhage, mass effect or midline shift. Ventricular size is stable from prior exam. Stable cerebral atrophy. 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 No acute cortical infarction. No mass lesion is noted on this unenhanced scan. Stable mild periventricular chronic white matter disease. IMPRESSION: No acute intracranial abnormality. Stable atrophy and chronic white matter disease. No definite acute cortical infarction.  Electronically Signed   By: Natasha Mead M.D.   On: 11/08/2015 14:05   Ct Angio Chest Pe W/cm &/or Wo Cm  11/08/2015  CLINICAL DATA:  Shortness of breath this morning. EXAM:  CT ANGIOGRAPHY CHEST WITH CONTRAST TECHNIQUE: Multidetector CT imaging of the chest was performed using the standard protocol during bolus administration of intravenous contrast. Multiplanar CT image reconstructions and MIPs were obtained to evaluate the vascular anatomy. CONTRAST:  75mL OMNIPAQUE IOHEXOL 350 MG/ML SOLN COMPARISON:  January 09, 2015 FINDINGS: There is no pulmonary embolus. There is no mediastinal or hilar lymphadenopathy. Atherosclerosis of the thoracic aorta is identified without aneurysmal dilatation or dissection. The heart size is normal. There is no pericardial effusion. Images of the lungs demonstrate mild atelectasis of the bilateral posterior lungs and medial right middle lobe. No focal pulmonary mass or pleural effusion is noted. There is no focal pneumonia. There is chronic elevation of right hemidiaphragm unchanged. Degenerative joint changes of the spine are identified. The visualized upper abdominal structures are unremarkable. Previously noted upper pole left kidney cysts is identified unchanged Review of the MIP images confirms the above findings. IMPRESSION: No pulmonary embolus. Atelectasis of bilateral lung bases. Electronically Signed   By: Sherian Rein M.D.   On: 11/08/2015 14:10   Dg Chest Port 1 View  11/08/2015  CLINICAL DATA:  75 year old female with shortness of breath since 05/1939 5 hours. Respiratory distress. Initial encounter. EXAM: PORTABLE CHEST 1 VIEW COMPARISON:  06/08/2015 and earlier. FINDINGS: Portable AP upright view at 1053 hours. Large body habitus. Chronically low but lower lung volumes than prior. Stable cardiac size and mediastinal contours. No pneumothorax, pulmonary edema or consolidation. IMPRESSION: Low lung volumes.  No acute cardiopulmonary abnormality identified. Electronically  Signed   By: Odessa Fleming M.D.   On: 11/08/2015 11:16   Dg Ugi W/high Density W/kub  11/12/2015  CLINICAL DATA:  Anemia, constipation, unable to swallow large liquid and solid food boluses, choking sensation, history of partial colectomy. EXAM: UPPER GI SERIES WITH KUB TECHNIQUE: After obtaining a scout radiograph a routine upper GI series was performed using thin barium. FLUOROSCOPY TIME:  Fluoroscopy Time (in minutes and seconds): 1 minutes, 30 seconds Number of Acquired Images:  7 + 2 KUB images COMPARISON:  Abdominal images of January 09, 2015 FINDINGS: The KUB images reveal a normal bowel gas pattern. A large amount of metallic fusion hardware is present in the lumbar spine which limits the study. There is scleroses of the lower thoracic vertebral bodies. The patient ingested thin barium in the semi supine position without difficulty. There was no laryngeal penetration of the barium. The thoracic esophagus distended well. Esophageal motility was normal. The patient ingested the 13 mm barium tablet without difficulty. It hung at the level of the GE junction. The stomach distended reasonably well. Gastric emptying was prompt which limited distention of the stomach. The duodenal bulb was normal in appearance. A probable duodenal diverticulum was observed transiently. There was no ulcer niche. IMPRESSION: The study is limited due to the patient's clinical condition. However, no significant upper or mid esophageal abnormality was observed. Within the distal esophagus the barium tablet hung at the GE junction though the patient had no sensation of this. This may be responsible for some of the patient's symptoms. Upper endoscopy would be useful if the patient can undergo the procedure. Limited distention of the stomach was obtained. The stomach is grossly normal in contour. Gastric emptying is prompt. There may be a small duodenal diverticulum which was transiently observed fluoroscopically. Electronically Signed   By:  David  [place]az[place] M.D.   On: 11/12/2015 08:15     CBC  Recent Labs Lab 11/08/15 1030 11/09/15 0626 11/10/15 0550  WBC  11.2* 10.6 7.7  HGB 7.5* 8.3* 9.1*  HCT 24.1* 26.1* 28.5*  PLT 429 359 396  MCV 84.2 86.2 83.3  MCH 26.1 27.4 26.7  MCHC 31.0* 31.8* 32.0  RDW 17.0* 16.6* 16.8*  LYMPHSABS 0.7*  --   --   MONOABS 1.0*  --   --   EOSABS 0.1  --   --   BASOSABS 0.1  --   --     Chemistries   Recent Labs Lab 11/08/15 1030 11/09/15 0626 11/10/15 0550 11/12/15 0511  NA 137 138 136 136  K 5.2* 4.4 4.2 4.3  CL 97* 102 98* 100*  CO2 GLUCOSE 165* 123* 166* 175*  BUN 35* 24* 20 39*  CREATININE 1.52* 0.97 0.80 0.99  CALCIUM 8.4* 8.5* 9.0 8.7*  AST 14*  --   --   --   ALT 14  --   --   --   ALKPHOS 95  --   --   --   BILITOT 0.5  --   --   --    ------------------------------------------------------------------------------------------------------------------ estimated creatinine clearance is 67.8 mL/min (by C-G formula based on Cr of 0.99). ------------------------------------------------------------------------------------------------------------------ No results for input(s): HGBA1C in the last 72 hours. ------------------------------------------------------------------------------------------------------------------ No results for input(s): CHOL, HDL, LDLCALC, TRIG, CHOLHDL, LDLDIRECT in the last 72 hours. ------------------------------------------------------------------------------------------------------------------ No results for input(s): TSH, T4TOTAL, T3FREE, THYROIDAB in the last 72 hours.  Invalid input(s): FREET3 ------------------------------------------------------------------------------------------------------------------ No results for input(s): VITAMINB12, FOLATE, FERRITIN, TIBC, IRON, RETICCTPCT in the last 72 hours.  Coagulation profile No results for input(s): INR, PROTIME in the last 168 hours.  No results for input(s): DDIMER in the last  72 hours.  Cardiac Enzymes  Recent Labs Lab 11/08/15 1030  TROPONINI 0.03   ------------------------------------------------------------------------------------------------------------------ Invalid input(s): POCBNP    Assessment & Plan  Patient is a 75 year old white female nursing home resident  #1 Acute respiratory failure with hypercapnia suspect due to acute on chronic COPD exacerbation as well as respiratory suppressions from narcotics.  Slowly improving,, and Levaquin, by mouth prednisone, nebulizers, oxygen. Patient is stable pulmonary wise to have upper endoscopy. #2 Acute metabolic encephalopathy, possible due to narcotics; improved.   #3 Acute renal failure with dehydration. Improved with IV fluids ; likley due to ATN  . #4 Hyperkalemia, resolved with IV fluids  #5 Anemia. Possible GI blood loss seen by GI will eventually need EGD placed on Protonix twice a day, hold Xarelto Following GI. Patient received 2 units of packed RBC. Hemoglobin stable at 9.1.  Had  upper GI series today, Within the distal esophagus the barium tablet hung at the GE junction though the patient had no sensation of this. This may be responsible for some of the patient's symptoms. Upper endoscopy would be useful if the patient can undergo the procedure. So I appreciate GI follow up,pt can have EGD ,respiratory wise pt is improving .  #6 Chronic Afib. Hold xarelto. Heart rate stable .continue amiodarone. Held  Xarelto secondary to anemia requiring transfusions.  #7 Chronic diastolic CHF, stable/restarted  the Lasix . #8 Hypertension. Continue metoprolol  #9 Diabetes. Hold metformin, due to recovery from renal failure. continue sliding scale.   #10 OSA. CPAP at night.   11 .constipation: Continue lactulose, Colace.  Discharge to  [facility] when medically stable,.     Code Status Orders        Start     Ordered   11/08/15 1803  Do not attempt resuscitation (DNR)  Continuous     Question Answer Comment  In the event of cardiac or respiratory ARREST Do not call a "code blue"   In the event of cardiac or respiratory ARREST Do not perform Intubation, CPR, defibrillation or ACLS   In the event of cardiac or respiratory ARREST Use medication by any route, position, wound care, and other measures to relive pain and suffering. May use oxygen, suction and manual treatment of airway obstruction as needed for comfort.      11/08/15 1802    Code Status History    Date Active Date Inactive Code Status Order ID Comments User Context   06/08/2015  9:40 AM 06/10/2015  2:44 PM Full Code [id number]  Alford Highland, MD ED   05/27/2015  7:19 PM 05/31/2015  4:04 PM Full Code [id number]  Enid Baas, MD Inpatient    Advance Directive Documentation        Most Recent Value   Type of Advance Directive  Healthcare Power of Attorney   Pre-existing out of facility DNR order (yellow form or pink MOST form)     "MOST" Form in Place?             Consults  gi  DVT Prophylaxis  scd's   Lab Results  Component Value Date   PLT 396 11/10/2015   Than 50% of time spent in coordination and counseling  o  Time Spent in minutes ;   Mccartney Brucks M.D on 11/12/2015 at 11:59 AM  Between 7am to 6pm - Pager - [phone]  After 6pm go to [email or web address] - password [email or web address] [facility]  [facility]   Office  [phone]

## 2015-11-12 NOTE — Progress Notes (Signed)
Subjective: This patient has been found to have heme positive stools and an upper GI that showed her to have a normal-appearing stomach but delayed passage of the barium tablet. The patient reports that she is breathing better and is questioning when she can go home.   Objective: Vital signs in last 24 hours: Filed Vitals:   11/11/15 2108 11/12/15 0545 11/12/15 0826 11/12/15 1419  BP: 141/50 110/50 157/59 151/62  Pulse: 65 74 64 55  Temp: 98.1 F (36.7 C) 98.3 F (36.8 C) 98 F (36.7 C) 98.4 F (36.9 C)  TempSrc: Oral Oral Oral Oral  Resp: Height:      Weight:      SpO2: 91% 96% 98% 97%   Weight change:  No intake or output data in the 24 hours ending 11/12/15 1628   Exam: Alert and orientated 3 Labored respiration  Lab Results: @ Micro Results: Recent Results (from the past 240 hour(s))  MRSA PCR Screening     Status: None   Collection Time: 11/08/15 10:10 PM  Result Value Ref Range Status   MRSA by PCR NEGATIVE NEGATIVE Final    Comment:        The GeneXpert MRSA Assay (FDA approved for NASAL specimens only), is one component of a comprehensive MRSA colonization surveillance program. It is not intended to diagnose MRSA infection nor to guide or monitor treatment for MRSA infections.    Studies/Results: Dg Ugi W/high Density W/kub  11/12/2015  CLINICAL DATA:  Anemia, constipation, unable to swallow large liquid and solid food boluses, choking sensation, history of partial colectomy. EXAM: UPPER GI SERIES WITH KUB TECHNIQUE: After obtaining a scout radiograph a routine upper GI series was performed using thin barium. FLUOROSCOPY TIME:  Fluoroscopy Time (in minutes and seconds): 1 minutes, 30 seconds Number of Acquired Images:  7 + 2 KUB images COMPARISON:  Abdominal images of January 09, 2015 FINDINGS: The KUB images reveal a normal bowel gas pattern. A large amount of metallic fusion hardware is present in the lumbar spine which limits the study.  There is scleroses of the lower thoracic vertebral bodies. The patient ingested thin barium in the semi supine position without difficulty. There was no laryngeal penetration of the barium. The thoracic esophagus distended well. Esophageal motility was normal. The patient ingested the 13 mm barium tablet without difficulty. It hung at the level of the GE junction. The stomach distended reasonably well. Gastric emptying was prompt which limited distention of the stomach. The duodenal bulb was normal in appearance. A probable duodenal diverticulum was observed transiently. There was no ulcer niche. IMPRESSION: The study is limited due to the patient's clinical condition. However, no significant upper or mid esophageal abnormality was observed. Within the distal esophagus the barium tablet hung at the GE junction though the patient had no sensation of this. This may be responsible for some of the patient's symptoms. Upper endoscopy would be useful if the patient can undergo the procedure. Limited distention of the stomach was obtained. The stomach is grossly normal in contour. Gastric emptying is prompt. There may be a small duodenal diverticulum which was transiently observed fluoroscopically. Electronically Signed   By: David  [place]and M.D.   On: 11/12/2015 08:15   Medications: I have reviewed the patient's current medications. Scheduled Meds: . amiodarone  200 mg Oral Daily  . antiseptic oral rinse  7 mL Mouth Rinse BID  . aspirin  81 mg Oral Daily  . buPROPion  150 mg  Oral Daily  . DULoxetine  60 mg Oral Daily  . furosemide  40 mg Oral Daily  . gabapentin  100 mg Oral TID  . insulin aspart  0-5 Units Subcutaneous QHS  . insulin aspart  0-9 Units Subcutaneous TID WC  . levofloxacin  500 mg Oral Daily  . levothyroxine  150 mcg Oral QAC breakfast  . magnesium oxide  400 mg Oral Daily  . metoprolol tartrate  25 mg Oral BID  . mometasone-formoterol  2 puff Inhalation BID  . montelukast  10 mg Oral Daily   . nicotine  14 mg Transdermal Daily  . pantoprazole  40 mg Oral BID  . [START ON 11/13/2015] predniSONE  50 mg Oral Q breakfast  . rOPINIRole  2 mg Oral QHS  . simvastatin  10 mg Oral QHS  . sodium chloride flush  3 mL Intravenous Q12H  . tiotropium  18 mcg Inhalation Daily   Continuous Infusions:  PRN Meds:.sodium chloride, acetaminophen **OR** acetaminophen, albuterol, docusate sodium, guaiFENesin, ipratropium-albuterol, lactulose, loperamide, ondansetron **OR** ondansetron (ZOFRAN) IV, oxyCODONE, polyvinyl alcohol, simethicone, sodium chloride flush, zolpidem   Assessment: Active Problems:   Acute respiratory failure without hypercapnia (HCC)   Anemia   Early satiety   Blood in stool    Plan: This patient has heme positive stools with her most recent hemoglobin being higher than her previous hemoglobin. The patient has a history of a colonoscopy 2 years ago and an upper barium GI exam that showed no ulcers or masses.The patient should follow up as an outpatient with her primary gastrologist Dr Bluford Kaufmann.   LOS: 4 days   Daren Servando Snare 11/12/2015, 4:28 PM

## 2015-11-12 NOTE — Care Management Important Message (Signed)
Important Message  Patient Details  Name: [name] MRN: [id number] Date of Birth: Jun 04, 1941   Medicare Important Message Given:  Yes    Gwenette Greet, RN 11/12/2015, 11:39 AM

## 2015-11-13 LAB — GLUCOSE, CAPILLARY
GLUCOSE-CAPILLARY: 127 mg/dL — AB (ref 65–99)
Glucose-Capillary: 131 mg/dL — ABNORMAL HIGH (ref 65–99)
Glucose-Capillary: 229 mg/dL — ABNORMAL HIGH (ref 65–99)

## 2015-11-13 MED ORDER — PREDNISONE 10 MG PO TABS: ORAL_TABLET | ORAL | Status: AC

## 2015-11-13 NOTE — Discharge Instructions (Signed)
Heart healthy and ADA diet.  Activity as tolerated. Avoid narcotics. Fall and aspiration precaution.

## 2015-11-13 NOTE — Progress Notes (Signed)
Report called to Mckinley Jewel at [facility], EMS called for transport, pt with no noted complaints, no distress or discomfort noted

## 2015-11-13 NOTE — Progress Notes (Signed)
Pt being discharged via EMS at this time, pt with no noted complaints at discharge, no distress or discomfort noted, pt in good spirits

## 2015-11-13 NOTE — Discharge Summary (Addendum)
[facility] - [place] at [facility]   PATIENT NAME: [name]    MR#:  [id number]  DATE OF BIRTH:  1940/12/28  DATE OF ADMISSION:  11/08/2015 ADMITTING PHYSICIAN: Shaune Pollack, MD  DATE OF DISCHARGE: 11/13/2015 PRIMARY CARE PHYSICIAN: None local.   ADMISSION DIAGNOSIS:  Shortness of breath [R06.02]   DISCHARGE DIAGNOSIS:   Acute respiratory failure with hypercapnia suspect due to acute on chronic COPD exacerbation as well as respiratory suppressions from narcotics.  Acute metabolic encephalopathy, possible due to narcotics Acute renal failure with dehydration Anemia. Possible GI blood loss SECONDARY DIAGNOSIS:   Past Medical History  Diagnosis Date  . Hypothyroidism   . Depression   . Hypertensive heart disease   . Lumbar disc disease   . Cervical disc disease   . Paroxysmal atrial flutter (HCC)     a. s/p prior DCCV.  Marland Kitchen History of ETOH abuse     dependence with withdrawl seizures  . CAD (coronary artery disease)     a. 05/2011 NSTEMI/PCI: LM nl, LAD nl, LCX 60m (4.0x38 Promus DES), RCA 100 CTO.  Marland Kitchen Obstructive sleep apnea   . Neuropathy (HCC)   . Anemia   . GERD (gastroesophageal reflux disease)   . Diabetes mellitus without complication (HCC)   . Restless leg syndrome   . Unspecified psychosis   . Chronic diastolic CHF (congestive heart failure) (HCC)     a. 02/2012 Echo: EF 55-60%, Gr1 DD, mildly dil LA, PASP .  Marland Kitchen COPD (chronic obstructive pulmonary disease) (HCC)   . PAF (paroxysmal atrial fibrillation) (HCC)     a. CHA2DS2VASc = 6-->amio/xarelto.  . Acute respiratory failure (HCC)     a. 06/2015 in setting of acute encephalopathy/Rx narcotic usage.  . Hyperlipidemia     HOSPITAL COURSE:   #1 Acute respiratory failure with hypercapnia suspect due to acute on chronic COPD exacerbation as well as respiratory suppressions from narcotics.  Slowly improving, was on Levaquin, continue taper by mouth prednisone, nebulizers, oxygen. Patient  is stable pulmonary wise to have upper endoscopy.  #2 Acute metabolic encephalopathy, possible due to narcotics; improved.   #3 Acute renal failure with dehydration. Improved with IV fluids ; likley due to ATN  . #4 Hyperkalemia, resolved with IV fluids  #5 Anemia. Possible GI blood loss seen by GI will eventually need EGD placed on Protonix twice a day, hold Xarelto Following GI. Patient received 2 units of packed RBC. Hemoglobin stable at 9.1. Had upper GI series: Within the distal esophagus the barium tablet hung at the GE junction though the patient had no sensation of this. This may be responsible for some of the patient's symptoms. Upper endoscopy would be useful if the patient can undergo the procedure. Per Dr. Servando Snare, the patient may follow up Dr. Bluford Kaufmann for EGD as outpatient.  #6 Chronic Afib. Hold xarelto. Heart rate stable .continue amiodarone. Held Xarelto secondary to anemia requiring transfusions.  #7 Chronic diastolic CHF, stable/restarted the Lasix . #8 Hypertension. Continue metoprolol  #9 Diabetes. Hold metformin, due to recovery from renal failure. continue sliding scale.   #10 OSA. CPAP at night.  11 .constipation: Continue lactulose, Colace.  DISCHARGE CONDITIONS:   Stable, discharge to SNF today.  CONSULTS OBTAINED:  Treatment Team:  Midge Minium, MD Shaune Pollack, MD  DRUG ALLERGIES:   Allergies  Allergen Reactions  . Lisinopril Other (See Comments)    Unknown reaction  . Latex Itching and Rash    DISCHARGE MEDICATIONS:   Current Discharge  Medication List    START taking these medications   Details  predniSONE (DELTASONE) 10 MG tablet 40 mg po daily for 2 days, 20 mg po daily for 2 days, 10 mg po daily for 2 days. Qty: 14 tablet, Refills: 0      CONTINUE these medications which have NOT CHANGED   Details  acetaminophen (TYLENOL) 325 MG tablet Take 650 mg by mouth 3 (three) times daily.     amiodarone (PACERONE) 200 MG tablet Take 200 mg  by mouth daily.     aspirin 81 MG chewable tablet Chew 81 mg by mouth daily.    buPROPion (WELLBUTRIN XL) 150 MG 24 hr tablet Take 150 mg by mouth daily.    Cholecalciferol (VITAMIN D3) 50000 UNITS CAPS Take 50,000 Units by mouth once a week. Take on Sundays.    clobetasol cream (TEMOVATE) 0.05 % Apply 1 application topically 2 (two) times daily. BID FOR RASH AND EVERY 12 HOURS PRN FOR DRY HANDS    docusate sodium (COLACE) 100 MG capsule Take 100 mg by mouth every 12 (twelve) hours as needed for mild constipation or moderate constipation.     DULoxetine (CYMBALTA) 60 MG capsule Take 60 mg by mouth daily.     Fluticasone-Salmeterol (ADVAIR) 250-50 MCG/DOSE AEPB Inhale 1 puff into the lungs 2 (two) times daily.    furosemide (LASIX) 40 MG tablet Take 40 mg by mouth daily.    gabapentin (NEURONTIN) 100 MG capsule Take 100 mg by mouth 3 (three) times daily.     guaifenesin (HUMIBID E) 400 MG TABS tablet Take 400 mg by mouth every 8 (eight) hours as needed (for cough.).    hydrocerin (EUCERIN) CREA Apply 1 application topically at bedtime.    ipratropium-albuterol (DUONEB) 0.5-2.5 (3) MG/3ML SOLN Take 3 mLs by nebulization every 6 (six) hours as needed (for congestion.).    levothyroxine (SYNTHROID, LEVOTHROID) 150 MCG tablet Take 150 mcg by mouth daily.     loperamide (IMODIUM A-D) 2 MG tablet Take 2 mg by mouth every 6 (six) hours as needed for diarrhea or loose stools.     magnesium oxide (MAG-OX) 400 MG tablet Take 400 mg by mouth daily.    metFORMIN (GLUCOPHAGE) 500 MG tablet Take 250 mg by mouth daily.     montelukast (SINGULAIR) 10 MG tablet Take 10 mg by mouth daily.     Omeprazole 20 MG TBEC Take 20 mg by mouth 2 (two) times daily.    ondansetron (ZOFRAN-ODT) 4 MG disintegrating tablet Take 4 mg by mouth every 6 (six) hours as needed for nausea or vomiting.    POLYETHYLENE GLYCOL 3350 PO Take 17 g by mouth at bedtime.    polyvinyl alcohol (LIQUITEARS) 1.4 % ophthalmic  solution Place 1 drop into both eyes every 6 (six) hours as needed for dry eyes.    potassium chloride SA (K-DUR,KLOR-CON) 20 MEQ tablet Take 20 mEq by mouth daily.    !! rOPINIRole (REQUIP) 2 MG tablet Take 2 mg by mouth at bedtime.    Simethicone 80 MG TABS Take 80 mg by mouth every 6 (six) hours as needed (for flatulence.).    simvastatin (ZOCOR) 10 MG tablet Take 10 mg by mouth at bedtime.     Skin Protectants, Misc. (CALAZIME SKIN PROTECTANT) PSTE Apply topically.    tiotropium (SPIRIVA) 18 MCG inhalation capsule Place 18 mcg into inhaler and inhale daily.    vitamin C (ASCORBIC ACID) 500 MG tablet Take 500 mg by mouth 2 (two) times  daily.     Wound Dressings (MEDIHONEY WOUND/BURN DRESSING) GEL Apply 1 application topically every three (3) days as needed.    ammonium lactate (LAC-HYDRIN) 12 % lotion Apply topically 2 (two) times daily. Qty: 400 g, Refills: 0    Melatonin 5 MG TABS Take 5 mg by mouth at bedtime.    !! rOPINIRole (REQUIP) 1 MG tablet Take 2 mg by mouth at bedtime.      !! - Potential duplicate medications found. Please discuss with provider.    STOP taking these medications     oxyCODONE (OXY IR/ROXICODONE) 5 MG immediate release tablet      oxyCODONE (OXY IR/ROXICODONE) 5 MG immediate release tablet      Rivaroxaban (XARELTO) 20 MG TABS tablet      ertapenem 1 g in sodium chloride 0.9 % 50 mL          DISCHARGE INSTRUCTIONS:    If you experience worsening of your admission symptoms, develop shortness of breath, life threatening emergency, suicidal or homicidal thoughts you must seek medical attention immediately by calling 911 or calling your MD immediately  if symptoms less severe.  You Must read complete instructions/literature along with all the possible adverse reactions/side effects for all the Medicines you take and that have been prescribed to you. Take any new Medicines after you have completely understood and accept all the possible adverse  reactions/side effects.   Please note  You were cared for by a hospitalist during your hospital stay. If you have any questions about your discharge medications or the care you received while you were in the hospital after you are discharged, you can call the unit and asked to speak with the hospitalist on call if the hospitalist that took care of you is not available. Once you are discharged, your primary care physician will handle any further medical issues. Please note that NO REFILLS for any discharge medications will be authorized once you are discharged, as it is imperative that you return to your primary care physician (or establish a relationship with a primary care physician if you do not have one) for your aftercare needs so that they can reassess your need for medications and monitor your lab values.    Today   SUBJECTIVE   No complaint.    VITAL SIGNS:  Blood pressure 134/48, pulse 61, temperature 98.2 F (36.8 C), temperature source Oral, resp. rate 20, height 5\' 6"  (1.676 m), weight 123.288 kg (271 lb 12.8 oz), SpO2 93 %.  I/O:  No intake or output data in the 24 hours ending 11/13/15 1116  PHYSICAL EXAMINATION:  GENERAL:  75 y.o.-year-old patient lying in the bed with no acute distress. Morbid obese. EYES: Pupils equal, round, reactive to light and accommodation. No scleral icterus. Extraocular muscles intact.  HEENT: Head atraumatic, normocephalic. Oropharynx and nasopharynx clear.  NECK:  Supple, no jugular venous distention. No thyroid enlargement, no tenderness.  LUNGS: Normal breath sounds bilaterally, no wheezing, rales,rhonchi or crepitation. No use of accessory muscles of respiration.  CARDIOVASCULAR: S1, S2 normal. No murmurs, rubs, or gallops.  ABDOMEN: Soft, non-tender, non-distended. Bowel sounds present. No organomegaly or mass.  EXTREMITIES: No pedal edema, cyanosis, or clubbing.  NEUROLOGIC: Cranial nerves II through XII are intact. Muscle strength 4/5 in  all extremities. Sensation intact. Gait not checked.  PSYCHIATRIC: The patient is alert and oriented x 3.  SKIN: No obvious rash, lesion, or ulcer.   DATA REVIEW:   CBC  Recent Labs Lab 11/10/15 0550  WBC 7.7  HGB 9.1*  HCT 28.5*  PLT 396    Chemistries   Recent Labs Lab 11/08/15 1030  11/12/15 0511  NA 137  < > 136  K 5.2*  < > 4.3  CL 97*  < > 100*  CO2 31  < > 29  GLUCOSE 165*  < > 175*  BUN 35*  < > 39*  CREATININE 1.52*  < > 0.99  CALCIUM 8.4*  < > 8.7*  AST 14*  --   --   ALT 14  --   --   ALKPHOS 95  --   --   BILITOT 0.5  --   --   < > = values in this interval not displayed.  Cardiac Enzymes  Recent Labs Lab 11/08/15 1030  TROPONINI 0.03    Microbiology Results  Results for orders placed or performed during the hospital encounter of 11/08/15  MRSA PCR Screening     Status: None   Collection Time: 11/08/15 10:10 PM  Result Value Ref Range Status   MRSA by PCR NEGATIVE NEGATIVE Final    Comment:        The GeneXpert MRSA Assay (FDA approved for NASAL specimens only), is one component of a comprehensive MRSA colonization surveillance program. It is not intended to diagnose MRSA infection nor to guide or monitor treatment for MRSA infections.     RADIOLOGY:  Dg Ugi W/high Density W/kub  11/12/2015  CLINICAL DATA:  Anemia, constipation, unable to swallow large liquid and solid food boluses, choking sensation, history of partial colectomy. EXAM: UPPER GI SERIES WITH KUB TECHNIQUE: After obtaining a scout radiograph a routine upper GI series was performed using thin barium. FLUOROSCOPY TIME:  Fluoroscopy Time (in minutes and seconds): 1 minutes, 30 seconds Number of Acquired Images:  7 + 2 KUB images COMPARISON:  Abdominal images of January 09, 2015 FINDINGS: The KUB images reveal a normal bowel gas pattern. A large amount of metallic fusion hardware is present in the lumbar spine which limits the study. There is scleroses of the lower thoracic  vertebral bodies. The patient ingested thin barium in the semi supine position without difficulty. There was no laryngeal penetration of the barium. The thoracic esophagus distended well. Esophageal motility was normal. The patient ingested the 13 mm barium tablet without difficulty. It hung at the level of the GE junction. The stomach distended reasonably well. Gastric emptying was prompt which limited distention of the stomach. The duodenal bulb was normal in appearance. A probable duodenal diverticulum was observed transiently. There was no ulcer niche. IMPRESSION: The study is limited due to the patient's clinical condition. However, no significant upper or mid esophageal abnormality was observed. Within the distal esophagus the barium tablet hung at the GE junction though the patient had no sensation of this. This may be responsible for some of the patient's symptoms. Upper endoscopy would be useful if the patient can undergo the procedure. Limited distention of the stomach was obtained. The stomach is grossly normal in contour. Gastric emptying is prompt. There may be a small duodenal diverticulum which was transiently observed fluoroscopically. Electronically Signed   By: David  [place] M.D.   On: 11/12/2015 08:15        Management plans discussed with the patient, family and they are in agreement.  CODE STATUS:     Code Status Orders        Start     Ordered   11/08/15 1803  Do not attempt resuscitation (  DNR)   Continuous    Question Answer Comment  In the event of cardiac or respiratory ARREST Do not call a "code blue"   In the event of cardiac or respiratory ARREST Do not perform Intubation, CPR, defibrillation or ACLS   In the event of cardiac or respiratory ARREST Use medication by any route, position, wound care, and other measures to relive pain and suffering. May use oxygen, suction and manual treatment of airway obstruction as needed for comfort.      11/08/15 1802    Code  Status History    Date Active Date Inactive Code Status Order ID Comments User Context   06/08/2015  9:40 AM 06/10/2015  2:44 PM Full Code [id number]  Alford Highland, MD ED   05/27/2015  7:19 PM 05/31/2015  4:04 PM Full Code [id number]  Enid Baas, MD Inpatient    Advance Directive Documentation        Most Recent Value   Type of Advance Directive  Healthcare Power of Attorney   Pre-existing out of facility DNR order (yellow form or pink MOST form)     "MOST" Form in Place?        TOTAL TIME TAKING CARE OF THIS PATIENT: 35 minutes.    Shaune Pollack M.D on 11/13/2015 at 11:16 AM  Between 7am to 6pm - Pager - [phone]  After 6pm go to [email or web address] - password [email or web address] [facility]  [employer] [place] [facility]  Office  [phone]  CC: Primary care physician;

## 2015-11-13 NOTE — Progress Notes (Signed)
Inpatient Diabetes Program Recommendations  AACE/ADA: New Consensus Statement on Inpatient Glycemic Control (2015)  Target Ranges:  Prepandial:   less than 140 mg/dL      Peak postprandial:   less than 180 mg/dL (1-2 hours)      Critically ill patients:  140 - 180 mg/dL   Review of Glycemic Control Results for [name], [name] (MRN [id number]) as of 11/13/2015 10:57  Ref. Range 11/12/2015 08:24 11/12/2015 11:19 11/12/2015 17:23 11/12/2015 21:37 11/13/2015 07:47  Glucose-Capillary Latest Ref Range: 65-99 mg/dL 409 (H) 811 (H) 914 (H) 180 (H) 127 (H)   Diabetes history: Type 2 diabetes Outpatient Diabetes medications: Metformin 250 mg daily  Current orders for Inpatient glycemic control: Novolog sensitive tid with meals and HS, Prednisone 50 mg daily  Inpatient Diabetes Program Recommendations:    May consider increasing Novolog correction to moderate 0-15units tid while patient is on PO Prednisone.   Susette Racer, RN, BA, MHA, CDE Diabetes Coordinator Inpatient Diabetes Program  [phone] (Team Pager) [phone] [facility] Office) 11/13/2015 10:58 AM

## 2015-11-13 NOTE — Clinical Social Work Note (Signed)
Clinical Social Work Assessment  Patient Details  Name: [name] MRN: [id number] Date of Birth: Jan 07, 1941  Date of referral:  11/13/15               Reason for consult:  Facility Placement                Permission sought to share information with:  Family Supports Permission granted to share information::  Yes, Verbal Permission Granted  Name::     Eunice Blase, daughter   Housing/Transportation Living arrangements for the past 2 months:  Skilled Building surveyor of Information:  Adult Children Patient Interpreter Needed:  None Criminal Activity/Legal Involvement Pertinent to Current Situation/Hospitalization:  No - Comment as needed Significant Relationships:  Adult Children Lives with:  Facility Resident Do you feel safe going back to the place where you live?  Yes Need for family participation in patient care:  Yes (Comment)  Care giving concerns:  No care giving needs identified.    Social Worker assessment / plan:  CSW spoke with pt's daughter, Eunice Blase. CSW introduced herself and explained role of social work. CSW also explained the process of discharging to [facility]. Pt is a LTC pt at [facility]. Pt is ready for discharge today. Facility is ready to admit pt as they have received discharge information. Pt's daughter is aware and agreeable to discharge plan. RN will call report and EMS will provide transportation. CSW is signing off as no further needs identified.   Employment status:  Retired Database administrator PT Recommendations:  Not assessed at this time Information / Referral to community resources:  Skilled Nursing Facility  Patient/Family's Response to care:  Pt's daughter was Adult nurse of CSW support.   Patient/Family's Understanding of and Emotional Response to Diagnosis, Current Treatment, and Prognosis:  Pt's daughter understands that pt needs to return to facility.   Emotional Assessment Appearance:  Other (Comment  Required Attitude/Demeanor/Rapport:  Other Affect (typically observed):  Other Orientation:  Oriented to Self, Oriented to Place, Oriented to  Time Alcohol / Substance use:  Other Psych involvement (Current and /or in the community):  No (Comment)  Discharge Needs  Concerns to be addressed:  No discharge needs identified Readmission within the last 30 days:  No Current discharge risk:  None Barriers to Discharge:  No Barriers Identified   Dede Query, LCSW 11/13/2015, 4:41 PM

## 2015-11-13 NOTE — Progress Notes (Signed)
Dr Imogene Burn made aware that pt BP 134/48 order metoprolol and lasix, per Dr Imogene Burn ok to given lasix, hold metoprolol

## 2015-11-13 NOTE — Progress Notes (Signed)
Spoke with Sedalia Muta, [facility] rep at [phone], to notify of non-emergent EMS transport.  Auth notification reference given as [id number].   Service date range good from 11/13/15 - 02/10/16.   Gap exception requested to determine if services can be considered at an in-network level.

## 2016-01-05 DEATH — deceased

## 2016-12-17 IMAGING — CR DG CHEST 1V
1 series · 1 of 1 positions shown · non-contrast
Comparison: Portable exam 8787 hours compared to 05/27/2015

CLINICAL DATA: PICC line placement, history hypertension, MI,
diabetes mellitus, smoking, COPD, CHF

EXAM:
CHEST  1 VIEW

[ap]
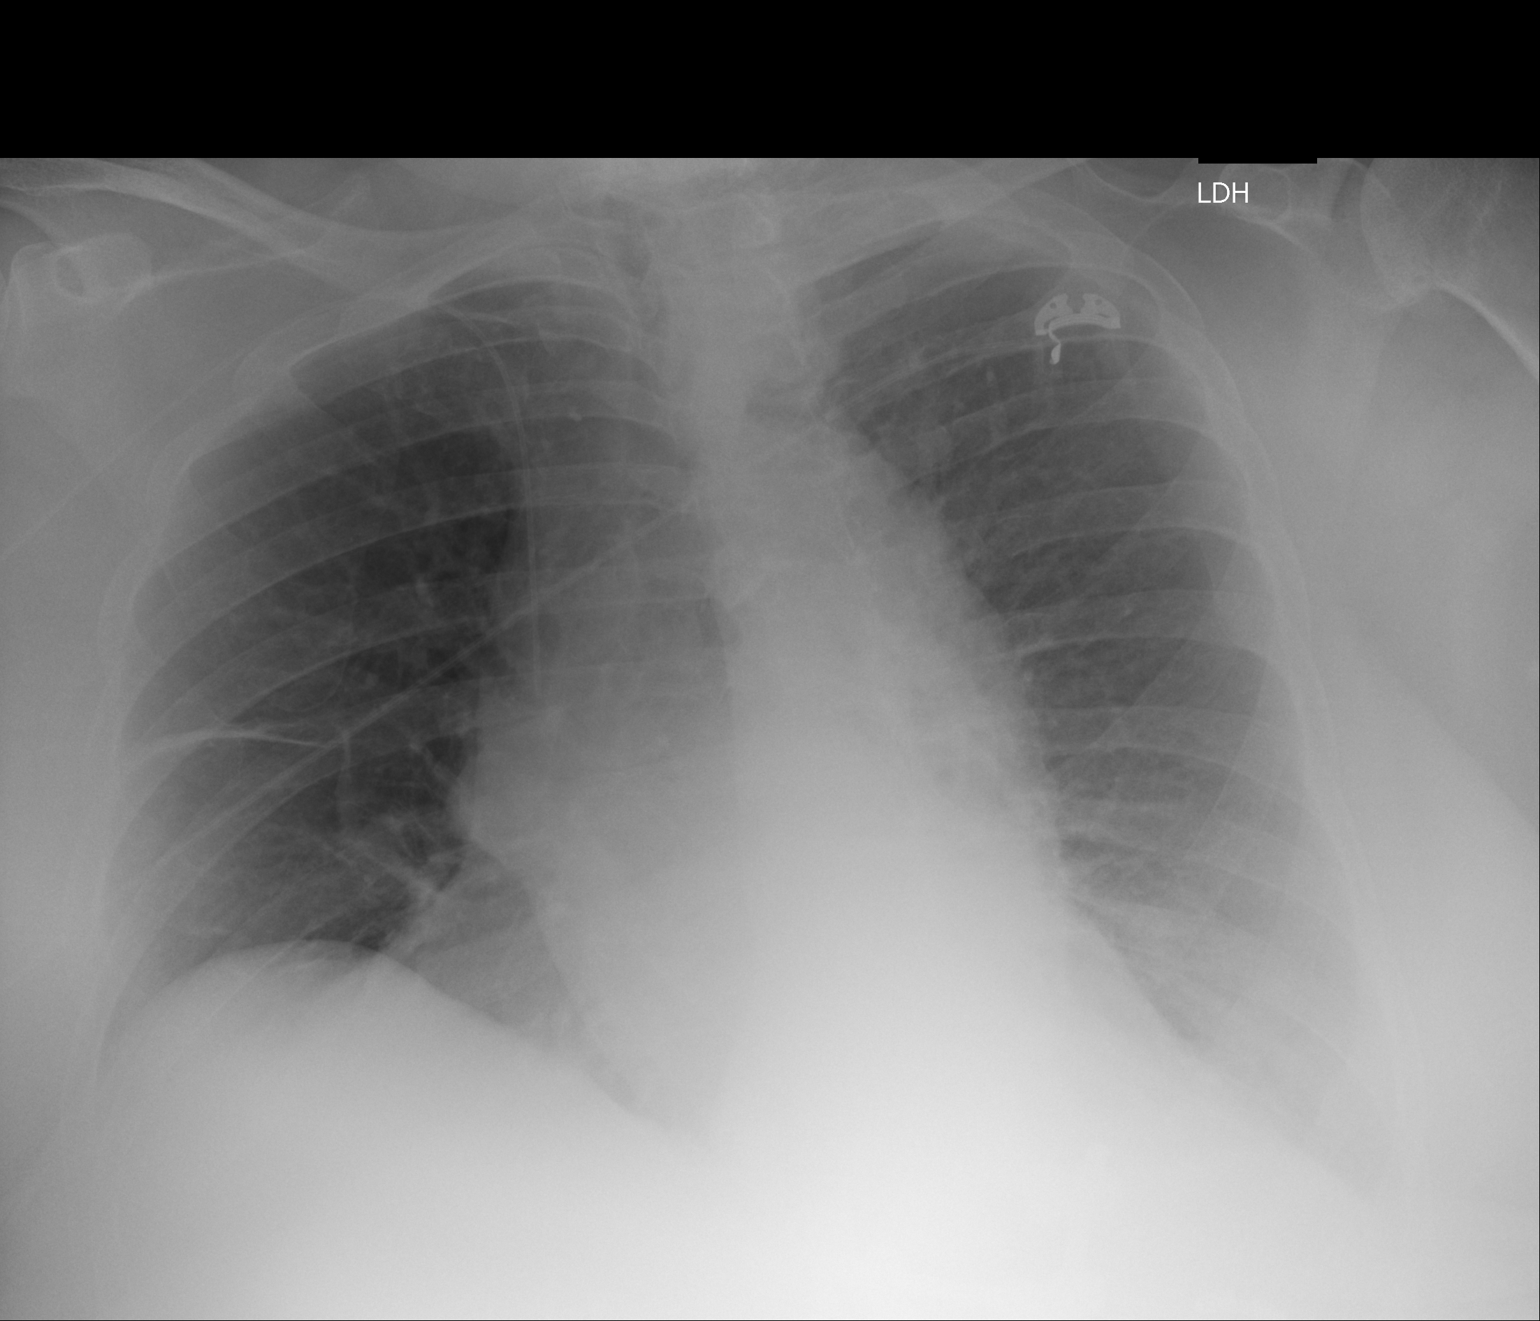

[1 of 1 positions shown; findings below may reference images not displayed]

FINDINGS: RIGHT arm PICC line tip projects over mid SVC.

Enlargement of cardiac silhouette.

Rotated to the RIGHT.

Mediastinal contours and pulmonary vascularity normal for technique.

Subsegmental atelectasis at minor fissure and RIGHT base.

Lungs otherwise clear.
IMPRESSION: Tip of RIGHT arm PICC line projects over mid SVC.

Subsegmental atelectasis at mid to lower RIGHT lung.
